# Patient Record
Sex: Male | Born: 1965 | Hispanic: No | Marital: Married | Smoking: Never smoker
Health system: Southern US, Community
[De-identification: ages and names within clinical notes are randomized; demographics above are authoritative.]

## PROBLEM LIST (undated history)

## (undated) DIAGNOSIS — G4730 Sleep apnea, unspecified: Secondary | ICD-10-CM

## (undated) DIAGNOSIS — I4892 Unspecified atrial flutter: Secondary | ICD-10-CM

## (undated) DIAGNOSIS — I10 Essential (primary) hypertension: Secondary | ICD-10-CM

## (undated) DIAGNOSIS — M1990 Unspecified osteoarthritis, unspecified site: Secondary | ICD-10-CM

## (undated) DIAGNOSIS — G709 Myoneural disorder, unspecified: Secondary | ICD-10-CM

## (undated) HISTORY — DX: Essential (primary) hypertension: I10

## (undated) HISTORY — PX: SPINE SURGERY: SHX786

## (undated) HISTORY — DX: Unspecified osteoarthritis, unspecified site: M19.90

## (undated) HISTORY — DX: Myoneural disorder, unspecified: G70.9

---

## 2013-03-22 DIAGNOSIS — Z87898 Personal history of other specified conditions: Secondary | ICD-10-CM | POA: Insufficient documentation

## 2013-05-24 ENCOUNTER — Other Ambulatory Visit (HOSPITAL_BASED_OUTPATIENT_CLINIC_OR_DEPARTMENT_OTHER): Payer: Self-pay | Admitting: Pain Medicine

## 2013-05-24 ENCOUNTER — Ambulatory Visit (HOSPITAL_BASED_OUTPATIENT_CLINIC_OR_DEPARTMENT_OTHER)
Admission: RE | Admit: 2013-05-24 | Discharge: 2013-05-24 | Disposition: A | Discharge status: Home / Self Care | Payer: 59 | Source: Ambulatory Visit | Attending: Pain Medicine | Admitting: Pain Medicine

## 2013-05-24 DIAGNOSIS — R52 Pain, unspecified: Secondary | ICD-10-CM

## 2013-05-24 DIAGNOSIS — M25519 Pain in unspecified shoulder: Secondary | ICD-10-CM | POA: Insufficient documentation

## 2013-05-24 DIAGNOSIS — M25569 Pain in unspecified knee: Secondary | ICD-10-CM | POA: Insufficient documentation

## 2013-05-27 DIAGNOSIS — G8918 Other acute postprocedural pain: Secondary | ICD-10-CM | POA: Insufficient documentation

## 2013-05-27 DIAGNOSIS — M549 Dorsalgia, unspecified: Secondary | ICD-10-CM | POA: Insufficient documentation

## 2013-05-27 DIAGNOSIS — I10 Essential (primary) hypertension: Secondary | ICD-10-CM | POA: Insufficient documentation

## 2013-06-08 ENCOUNTER — Other Ambulatory Visit (HOSPITAL_BASED_OUTPATIENT_CLINIC_OR_DEPARTMENT_OTHER): Payer: Self-pay | Admitting: Pain Medicine

## 2013-06-08 DIAGNOSIS — M25511 Pain in right shoulder: Secondary | ICD-10-CM

## 2013-06-08 DIAGNOSIS — M79605 Pain in left leg: Secondary | ICD-10-CM

## 2013-06-08 DIAGNOSIS — M542 Cervicalgia: Secondary | ICD-10-CM

## 2013-06-08 DIAGNOSIS — M5416 Radiculopathy, lumbar region: Secondary | ICD-10-CM

## 2013-06-08 DIAGNOSIS — M79604 Pain in right leg: Secondary | ICD-10-CM

## 2013-06-10 ENCOUNTER — Ambulatory Visit (HOSPITAL_BASED_OUTPATIENT_CLINIC_OR_DEPARTMENT_OTHER)
Admission: RE | Admit: 2013-06-10 | Discharge: 2013-06-10 | Disposition: A | Discharge status: Home / Self Care | Payer: 59 | Source: Ambulatory Visit | Attending: Pain Medicine | Admitting: Pain Medicine

## 2013-06-10 ENCOUNTER — Other Ambulatory Visit (HOSPITAL_BASED_OUTPATIENT_CLINIC_OR_DEPARTMENT_OTHER): Payer: 59

## 2013-06-10 DIAGNOSIS — R29890 Loss of height: Secondary | ICD-10-CM | POA: Insufficient documentation

## 2013-06-10 DIAGNOSIS — M25519 Pain in unspecified shoulder: Secondary | ICD-10-CM | POA: Insufficient documentation

## 2013-06-10 DIAGNOSIS — M25512 Pain in left shoulder: Secondary | ICD-10-CM

## 2013-06-10 DIAGNOSIS — M79604 Pain in right leg: Secondary | ICD-10-CM

## 2013-06-10 DIAGNOSIS — M1990 Unspecified osteoarthritis, unspecified site: Secondary | ICD-10-CM | POA: Insufficient documentation

## 2013-06-10 DIAGNOSIS — M25511 Pain in right shoulder: Secondary | ICD-10-CM

## 2013-06-10 DIAGNOSIS — M5416 Radiculopathy, lumbar region: Secondary | ICD-10-CM

## 2013-06-10 DIAGNOSIS — M542 Cervicalgia: Secondary | ICD-10-CM

## 2013-06-25 DIAGNOSIS — Z6841 Body Mass Index (BMI) 40.0 and over, adult: Secondary | ICD-10-CM | POA: Insufficient documentation

## 2013-10-01 DIAGNOSIS — G4700 Insomnia, unspecified: Secondary | ICD-10-CM | POA: Insufficient documentation

## 2013-10-22 ENCOUNTER — Other Ambulatory Visit: Payer: Self-pay | Admitting: Pain Medicine

## 2013-10-22 DIAGNOSIS — M5030 Other cervical disc degeneration, unspecified cervical region: Secondary | ICD-10-CM

## 2013-10-29 ENCOUNTER — Ambulatory Visit
Admission: RE | Admit: 2013-10-29 | Discharge: 2013-10-29 | Disposition: A | Discharge status: Home / Self Care | Payer: 59 | Source: Ambulatory Visit | Attending: Pain Medicine | Admitting: Pain Medicine

## 2013-10-29 DIAGNOSIS — M5030 Other cervical disc degeneration, unspecified cervical region: Secondary | ICD-10-CM

## 2014-01-06 DIAGNOSIS — G4733 Obstructive sleep apnea (adult) (pediatric): Secondary | ICD-10-CM | POA: Insufficient documentation

## 2015-08-08 ENCOUNTER — Encounter: Payer: Self-pay | Admitting: Family Medicine

## 2015-08-08 ENCOUNTER — Ambulatory Visit (INDEPENDENT_AMBULATORY_CARE_PROVIDER_SITE_OTHER): Payer: 59

## 2015-08-08 ENCOUNTER — Ambulatory Visit (INDEPENDENT_AMBULATORY_CARE_PROVIDER_SITE_OTHER): Payer: 59 | Admitting: Family Medicine

## 2015-08-08 VITALS — BP 132/88 | HR 80 | Temp 98.2°F | Resp 18

## 2015-08-08 DIAGNOSIS — R972 Elevated prostate specific antigen [PSA]: Secondary | ICD-10-CM

## 2015-08-08 DIAGNOSIS — S91302A Unspecified open wound, left foot, initial encounter: Secondary | ICD-10-CM | POA: Diagnosis not present

## 2015-08-08 DIAGNOSIS — G4730 Sleep apnea, unspecified: Secondary | ICD-10-CM | POA: Diagnosis not present

## 2015-08-08 DIAGNOSIS — Z6841 Body Mass Index (BMI) 40.0 and over, adult: Secondary | ICD-10-CM

## 2015-08-08 DIAGNOSIS — I10 Essential (primary) hypertension: Secondary | ICD-10-CM

## 2015-08-08 DIAGNOSIS — Z23 Encounter for immunization: Secondary | ICD-10-CM

## 2015-08-08 LAB — COMPREHENSIVE METABOLIC PANEL
ALK PHOS: 67 U/L (ref 40–115)
ALT: 20 U/L (ref 9–46)
AST: 21 U/L (ref 10–35)
Albumin: 4.1 g/dL (ref 3.6–5.1)
BUN: 8 mg/dL (ref 7–25)
CALCIUM: 11 mg/dL — AB (ref 8.6–10.3)
CO2: 30 mmol/L (ref 20–31)
Chloride: 102 mmol/L (ref 98–110)
Creat: 0.64 mg/dL — ABNORMAL LOW (ref 0.70–1.33)
GLUCOSE: 76 mg/dL (ref 65–99)
POTASSIUM: 4.9 mmol/L (ref 3.5–5.3)
Sodium: 138 mmol/L (ref 135–146)
TOTAL PROTEIN: 7.3 g/dL (ref 6.1–8.1)
Total Bilirubin: 0.5 mg/dL (ref 0.2–1.2)

## 2015-08-08 LAB — CBC WITH DIFFERENTIAL/PLATELET
BASOS ABS: 0 10*3/uL (ref 0.0–0.1)
Basophils Relative: 0 % (ref 0–1)
EOS PCT: 2 % (ref 0–5)
Eosinophils Absolute: 0.2 10*3/uL (ref 0.0–0.7)
HEMATOCRIT: 44.8 % (ref 39.0–52.0)
Hemoglobin: 14.4 g/dL (ref 13.0–17.0)
Lymphocytes Relative: 23 % (ref 12–46)
Lymphs Abs: 2.4 10*3/uL (ref 0.7–4.0)
MCH: 27.6 pg (ref 26.0–34.0)
MCHC: 32.1 g/dL (ref 30.0–36.0)
MCV: 85.8 fL (ref 78.0–100.0)
MONOS PCT: 14 % — AB (ref 3–12)
MPV: 9.8 fL (ref 8.6–12.4)
Monocytes Absolute: 1.5 10*3/uL — ABNORMAL HIGH (ref 0.1–1.0)
NEUTROS ABS: 6.3 10*3/uL (ref 1.7–7.7)
NEUTROS PCT: 61 % (ref 43–77)
Platelets: 229 10*3/uL (ref 150–400)
RBC: 5.22 MIL/uL (ref 4.22–5.81)
RDW: 14.1 % (ref 11.5–15.5)
WBC: 10.4 10*3/uL (ref 4.0–10.5)

## 2015-08-08 LAB — LIPID PANEL
CHOL/HDL RATIO: 4.1 ratio (ref <=5.0)
CHOLESTEROL: 176 mg/dL (ref 125–200)
HDL: 43 mg/dL (ref >=40)
LDL Cholesterol: 114 mg/dL (ref <130)
Triglycerides: 96 mg/dL (ref <150)
VLDL: 19 mg/dL (ref <30)

## 2015-08-08 LAB — HEMOGLOBIN A1C
Hgb A1c MFr Bld: 5.9 % — ABNORMAL HIGH (ref <5.7)
Mean Plasma Glucose: 123 mg/dL — ABNORMAL HIGH (ref <117)

## 2015-08-08 LAB — TSH: TSH: 2.921 u[IU]/mL (ref 0.350–4.500)

## 2015-08-08 MED ORDER — LOSARTAN POTASSIUM 100 MG PO TABS: 100.0000 mg | ORAL_TABLET | Freq: Every day | ORAL | Status: DC

## 2015-08-08 NOTE — Patient Instructions (Signed)
We will call you with your appointments for cat scan of your foot and wound center

## 2015-08-08 NOTE — Progress Notes (Signed)
Subjective:    Patient ID: HANNAH CRILL, male    DOB: 1966/05/08, 49 y.o.   MRN: [id number]  HPI This is a 49 yo male who presents today for follow up of HTN. He has not had any regular care in at least a year and a half. He recently found his old prescription for losartan and started it.   He has had a deep, open area on his left heel for about 7 months. He thinks it is getting better.   He is seen at the pain management center (Drs. Hardie Lora) for management of his back pain and right knee pain. He has a spinal cord stimulator and therefore can not have MRI imaging. Has not been seen by ortho for his knee.   Sees Dr. Jerre Simon for OSA and uses CPAP.   Past Medical History  Diagnosis Date  . Arthritis   . Hypertension    Past Surgical History  Procedure Laterality Date  . Spine surgery     Family History  Problem Relation Age of Onset  . Diabetes Mother   . Hyperlipidemia Mother   . Hypertension Mother   . Diabetes Sister   . Hypertension Sister    Social History  Substance Use Topics  . Smoking status: Not on file  . Smokeless tobacco: Not on file  . Alcohol Use: Not on file   Review of Systems  Constitutional: Negative for fever and chills.  Respiratory: Negative for cough, chest tightness and shortness of breath.   Cardiovascular: Positive for leg swelling. Negative for chest pain and palpitations.  Gastrointestinal: Negative for nausea, abdominal pain, diarrhea and constipation.  Endocrine: Positive for polydipsia. Negative for polyphagia and polyuria.  Musculoskeletal: Positive for back pain, joint swelling (right knee) and arthralgias.      Objective:   Physical Exam  Constitutional: He is oriented to person, place, and time. He appears well-developed and well-nourished. No distress.  Obese.  HENT:  Head: Normocephalic and atraumatic.  Right Ear: External ear normal.  Left Ear: External ear normal.  Nose: Nose normal.  Mouth/Throat: Oropharynx is  clear and moist.  Eyes: Conjunctivae are normal.  Neck: Normal range of motion. Neck supple.  Cardiovascular: Normal rate, regular rhythm and normal heart sounds.   Pulmonary/Chest: Effort normal and breath sounds normal.  Musculoskeletal: Edema: 1+ pretibial.       Feet:  Left knee with brace. Some crepitus with flexion/extension.   Neurological: He is alert and oriented to person, place, and time.  Skin: Skin is warm and dry. He is not diaphoretic.  Psychiatric: He has a normal mood and affect. His behavior is normal. Judgment and thought content normal.  Vitals reviewed.  BP 132/88 mmHg  Pulse 80  Temp(Src) 98.2 F (36.8 C)  Resp 18  [facility] reading (PRIMARY) by  Dr. Cleta Alberts- no acute abnormality, large achilles bone spur noted. .     Assessment & Plan:  Discussed with Dr. Cleta Alberts who also examined patient 1. Wound, open, foot with complication, left, initial encounter - this is possibly due to altered gait with chronic left knee pain.  - CBC with Differential/Platelet - Wound culture - Hemoglobin A1c - DG Foot Complete Left; Future - AMB referral to wound care center - CT Foot Left Wo Contrast; Future  2. Essential hypertension - Comprehensive metabolic panel - losartan (COZAAR) 100 MG tablet; Take 1 tablet (100 mg total) by mouth daily.  Dispense: 90 tablet; Refill: 1  3. Body mass index (BMI) of  50-59.9 in adult [facility](HCC) - Lipid panel - TSH - Hemoglobin A1c - encouraged him to eliminate soda and juice from his diet and decrease starches and sugars  4. Apnea, sleep - continue CPAP  5. Elevated PSA - PSA  6. Needs flu shot - Flu Vaccine QUAD 36+ mos IM  - 2 month follow up for CPE  Olean Reeeborah Jamichael Knotts, FNP-BC  [facility]  08/12/2015 6:10 PM

## 2015-08-09 LAB — PSA: PSA: 3.19 ng/mL (ref <=4.00)

## 2015-08-12 LAB — WOUND CULTURE
GRAM STAIN: NONE SEEN
GRAM STAIN: NONE SEEN

## 2015-08-13 ENCOUNTER — Other Ambulatory Visit: Payer: Self-pay | Admitting: Emergency Medicine

## 2015-08-13 MED ORDER — CEPHALEXIN 500 MG PO CAPS: 500.0000 mg | ORAL_CAPSULE | Freq: Three times a day (TID) | ORAL | Status: DC

## 2015-08-15 ENCOUNTER — Ambulatory Visit (HOSPITAL_BASED_OUTPATIENT_CLINIC_OR_DEPARTMENT_OTHER)
Admission: RE | Admit: 2015-08-15 | Discharge: 2015-08-15 | Disposition: A | Discharge status: Home / Self Care | Payer: 59 | Source: Ambulatory Visit | Attending: Family Medicine | Admitting: Family Medicine

## 2015-08-15 ENCOUNTER — Other Ambulatory Visit: Payer: Self-pay | Admitting: Family Medicine

## 2015-08-15 DIAGNOSIS — X58XXXA Exposure to other specified factors, initial encounter: Secondary | ICD-10-CM | POA: Diagnosis not present

## 2015-08-15 DIAGNOSIS — S91302A Unspecified open wound, left foot, initial encounter: Secondary | ICD-10-CM | POA: Diagnosis not present

## 2015-08-15 DIAGNOSIS — R112 Nausea with vomiting, unspecified: Secondary | ICD-10-CM

## 2015-08-15 MED ORDER — ONDANSETRON 8 MG PO TBDP: 8.0000 mg | ORAL_TABLET | Freq: Three times a day (TID) | ORAL | Status: DC | PRN

## 2015-08-16 ENCOUNTER — Telehealth: Payer: Self-pay

## 2015-08-16 NOTE — Telephone Encounter (Signed)
Left message for the patient call back about his referral to [facility].  The patient is scheduled for an appointment on 09/13/15 at 2:30pm with a 2:15pm arrival time.  If he has any questions or needs to change his appointment, he can call [phone] and press 1 for the scheduling department.  Their address is [address] [place], [place] [address].

## 2015-08-25 ENCOUNTER — Telehealth: Payer: Self-pay

## 2015-08-25 NOTE — Telephone Encounter (Signed)
Patients wife called inquiring about the referral.  I relayed the information regarding the appointment and provided her with the phone number.

## 2015-09-13 ENCOUNTER — Encounter (HOSPITAL_BASED_OUTPATIENT_CLINIC_OR_DEPARTMENT_OTHER): Payer: 59

## 2015-09-19 DIAGNOSIS — M25569 Pain in unspecified knee: Secondary | ICD-10-CM | POA: Diagnosis not present

## 2015-09-19 DIAGNOSIS — G894 Chronic pain syndrome: Secondary | ICD-10-CM | POA: Diagnosis not present

## 2015-09-19 DIAGNOSIS — M1288 Other specific arthropathies, not elsewhere classified, other specified site: Secondary | ICD-10-CM | POA: Diagnosis not present

## 2015-09-19 DIAGNOSIS — M47817 Spondylosis without myelopathy or radiculopathy, lumbosacral region: Secondary | ICD-10-CM | POA: Diagnosis not present

## 2015-09-19 MED FILL — OPANA ER 30 MG TABLET: 30 | 30 days supply | Qty: 60 | Fill #0

## 2015-09-19 MED FILL — NUCYNTA 100 MG TABLET: 100 | 30 days supply | Qty: 120 | Fill #0

## 2015-09-20 MED FILL — tiZANidine HCL 4 MG TABS: 4 | 30 days supply | Qty: 90 | Fill #3

## 2015-09-23 DIAGNOSIS — R0902 Hypoxemia: Secondary | ICD-10-CM | POA: Diagnosis not present

## 2015-09-23 DIAGNOSIS — G4733 Obstructive sleep apnea (adult) (pediatric): Secondary | ICD-10-CM | POA: Diagnosis not present

## 2015-10-10 ENCOUNTER — Encounter: Payer: Self-pay | Admitting: Family Medicine

## 2015-10-10 ENCOUNTER — Ambulatory Visit (INDEPENDENT_AMBULATORY_CARE_PROVIDER_SITE_OTHER): Payer: 59 | Admitting: Family Medicine

## 2015-10-10 VITALS — BP 140/92 | HR 67 | Temp 97.2°F | Resp 18 | Ht 71.0 in | Wt 382.0 lb

## 2015-10-10 DIAGNOSIS — Z0000 Encounter for general adult medical examination without abnormal findings: Secondary | ICD-10-CM

## 2015-10-10 DIAGNOSIS — Z6841 Body Mass Index (BMI) 40.0 and over, adult: Secondary | ICD-10-CM

## 2015-10-10 DIAGNOSIS — S91302A Unspecified open wound, left foot, initial encounter: Secondary | ICD-10-CM | POA: Diagnosis not present

## 2015-10-10 DIAGNOSIS — M25561 Pain in right knee: Secondary | ICD-10-CM | POA: Diagnosis not present

## 2015-10-10 DIAGNOSIS — I10 Essential (primary) hypertension: Secondary | ICD-10-CM | POA: Diagnosis not present

## 2015-10-10 DIAGNOSIS — R51 Headache: Secondary | ICD-10-CM | POA: Diagnosis not present

## 2015-10-10 DIAGNOSIS — G4730 Sleep apnea, unspecified: Secondary | ICD-10-CM

## 2015-10-10 DIAGNOSIS — R519 Headache, unspecified: Secondary | ICD-10-CM

## 2015-10-10 MED ORDER — LOSARTAN POTASSIUM-HCTZ 100-25 MG PO TABS: 1.0000 | ORAL_TABLET | Freq: Every day | ORAL | Status: DC

## 2015-10-10 MED FILL — LOSARTAN-HCTZ 100-25 MG TAB: 100-25 | 90 days supply | Qty: 90 | Fill #0

## 2015-10-10 NOTE — Progress Notes (Signed)
Subjective:    Patient ID: Charles Barnett, male    DOB: 1966/06/28, 50 y.o.   MRN: [id number]  HPI This is a pleasant 50 yo male who presents today for CPE. He is accompanied by his wife. He has multiple chronic problems and is disabled due to chronic back pain. He is seen by pain management regularly for his prescriptions and injections. He has an implanted nerve stimulator in his back which helps with pain.   Last CPE- many years PSA- 11/16 Colonoscopy- never, will think about Tdap- 2014 Flu- 11/16 Dental- 2016 Eye- regular Exercise- intermittent at best  He has had more headaches lately, had previous head trauma. Was treated with some type of injection therapy in the 90s which worked for [employer]. Has had headaches every 2 weeks lasting about a day. No photophobia, no visual changes, no nausea/vomiting.   Continues to have right knee pain and popping. Wears his brace.   24 hour diet recall- breakfast- fried potatoes with eggs, peppers and onions, Lunch- doesn't eat often, Dinner- meat, potatoes/rice, beans. Drinks 4 servings of soda a day, sweet tea, occasional juice.   Has OSA and uses cpap nightly with good results.   He was seen 08/08/15 with open wound on left heel. Wound was cultured and he was given keflex. Plain film and CT were negative for osteomyelitis. He was referred to the wound clinic. He had an appointment about a month later. He was unable to keep the appointment due to bad headache that day. He has been putting Neosporin on wound and filling hard skin with decreasing size of wound. He is able to wear shoes again and bear weight. No drainage.   Past Medical History  Diagnosis Date  . Arthritis   . Hypertension   . Neuromuscular disorder [facility])    Past Surgical History  Procedure Laterality Date  . Spine surgery     Family History  Problem Relation Age of Onset  . Diabetes Mother   . Hyperlipidemia Mother   . Hypertension Mother   . Heart disease Mother     . Diabetes Sister   . Hypertension Sister   . Heart disease Sister    Social History  Substance Use Topics  . Smoking status: Never Smoker   . Smokeless tobacco: None  . Alcohol Use: No    Review of Systems  Constitutional: Negative.   HENT: Positive for congestion.   Eyes: Negative.   Respiratory: Negative.   Cardiovascular: Positive for leg swelling.  Gastrointestinal: Positive for constipation (good results with miralax).  Endocrine: Negative.   Genitourinary: Negative.   Musculoskeletal: Positive for back pain, joint swelling, arthralgias and gait problem.  Skin: Negative.   Allergic/Immunologic: Positive for environmental allergies.  Neurological: Positive for headaches.  Hematological: Negative.   Psychiatric/Behavioral: Negative.        Objective:   Physical Exam  Constitutional: He is oriented to person, place, and time. He appears well-developed and well-nourished.  Morbidly obese  HENT:  Head: Normocephalic and atraumatic.  Right Ear: External ear normal.  Left Ear: External ear normal.  Nose: Nose normal.  Mouth/Throat: Oropharynx is clear and moist.  Eyes: Conjunctivae are normal. Pupils are equal, round, and reactive to light.  Neck: Normal range of motion. Neck supple. No thyromegaly present.  Cardiovascular: Normal rate, regular rhythm, normal heart sounds and intact distal pulses.   Pulmonary/Chest: Effort normal and breath sounds normal.  Abdominal: Soft. Bowel sounds are normal. There is no tenderness. There is  no rebound and no guarding. A hernia (reducible umbilical hernia (chronic)) is present.  Genitourinary: Penis normal.  Musculoskeletal: He exhibits edema (trace pretibial) and tenderness (generalized tenderness along lower back).  Generalized stiffness/decreased ROM back, hips, knees.   Lymphadenopathy:    He has no cervical adenopathy.  Neurological: He is alert and oriented to person, place, and time. He has normal reflexes.  Skin: Skin  is warm and dry.  Left heel wound much smaller, approximately 1.5 cm long and 4 mm deep with some surrounding callous. No erythema or drainage.   Psychiatric: He has a normal mood and affect. His behavior is normal. Judgment and thought content normal.  Vitals reviewed.  BP 138/100 mmHg  Pulse 67  Temp(Src) 97.2 F (36.2 C) (Oral)  Resp 18  Ht '5\' 11"'$  (1.803 m)  Wt 382 lb (173.274 kg)  BMI 53.30 kg/m2  SpO2 95% Wt Readings from Last 3 Encounters:  10/10/15 382 lb (173.274 kg)  Repeat BP 140/92 Labs from last visit 08/08/15 Recent Results (from the past 2160 hour(s))  CBC with Differential/Platelet     Status: Abnormal   Collection Time: 08/08/15  1:48 PM  Result Value Ref Range   WBC 10.4 4.0 - 10.5 K/uL   RBC 5.22 4.22 - 5.81 MIL/uL   Hemoglobin 14.4 13.0 - 17.0 g/dL   HCT 44.8 39.0 - 52.0 %   MCV 85.8 78.0 - 100.0 fL   MCH 27.6 26.0 - 34.0 pg   MCHC 32.1 30.0 - 36.0 g/dL   RDW 14.1 11.5 - 15.5 %   Platelets 229 150 - 400 K/uL   MPV 9.8 8.6 - 12.4 fL   Neutrophils Relative % 61 43 - 77 %   Neutro Abs 6.3 1.7 - 7.7 K/uL   Lymphocytes Relative 23 12 - 46 %   Lymphs Abs 2.4 0.7 - 4.0 K/uL   Monocytes Relative 14 (H) 3 - 12 %   Monocytes Absolute 1.5 (H) 0.1 - 1.0 K/uL   Eosinophils Relative 2 0 - 5 %   Eosinophils Absolute 0.2 0.0 - 0.7 K/uL   Basophils Relative 0 0 - 1 %   Basophils Absolute 0.0 0.0 - 0.1 K/uL   Smear Review Criteria for review not met   Comprehensive metabolic panel     Status: Abnormal   Collection Time: 08/08/15  1:48 PM  Result Value Ref Range   Sodium 138 135 - 146 mmol/L   Potassium 4.9 3.5 - 5.3 mmol/L   Chloride 102 98 - 110 mmol/L   CO2 30 20 - 31 mmol/L   Glucose, Bld 76 65 - 99 mg/dL   BUN 8 7 - 25 mg/dL   Creat 0.64 (L) 0.70 - 1.33 mg/dL   Total Bilirubin 0.5 0.2 - 1.2 mg/dL   Alkaline Phosphatase 67 40 - 115 U/L   AST 21 10 - 35 U/L   ALT 20 9 - 46 U/L   Total Protein 7.3 6.1 - 8.1 g/dL   Albumin 4.1 3.6 - 5.1 g/dL   Calcium 11.0  (H) 8.6 - 10.3 mg/dL  Lipid panel     Status: None   Collection Time: 08/08/15  1:48 PM  Result Value Ref Range   Cholesterol 176 125 - 200 mg/dL   Triglycerides 96 <150 mg/dL   HDL 43 >=40 mg/dL   Total CHOL/HDL Ratio 4.1 <=5.0 Ratio   VLDL 19 <30 mg/dL   LDL Cholesterol 114 <130 mg/dL       TSH  Status: None   Collection Time: 08/08/15  1:48 PM  Result Value Ref Range   TSH 2.921 0.350 - 4.500 uIU/mL  PSA     Status: None   Collection Time: 08/08/15  1:48 PM  Result Value Ref Range   PSA 3.19 <=4.00 ng/mL       Hemoglobin A1c     Status: Abnormal   Collection Time: 08/08/15  1:48 PM  Result Value Ref Range   Hgb A1c MFr Bld 5.9 (H) <5.7 %     Assessment & Plan:  1. Annual physical exam -- Discussed and encouraged healthy lifestyle choices- adequate sleep, regular exercise, stress management and healthy food choices.  2. Essential hypertension - blood pressure high in office today, will add diuretic - losartan-hydrochlorothiazide (HYZAAR) 100-25 MG tablet; Take 1 tablet by mouth daily.  Dispense: 90 tablet; Refill: 1  3. Body mass index (BMI) of 50-59.9 in adult [facility]) - discussed barriers to healthy food choices and encouraged regular exercise - the patient and his wife are looking into  weight loss education  4. Apnea, sleep - continue nightly CPAP use  5. Wound, open, foot with complication, left, initial encounter - patient was never seen wound center. Wound significantly decreased in size following antibiotic therapy and pain resolved. Continue to keep clean and dry, open to air when able. Follow up if pain, increased size or drainage  6. Right knee pain - encouraged weight loss - Ambulatory referral to Orthopedic Surgery  7. Nonintractable episodic headache, unspecified headache type - has been a little better lately, discussed treatment options with tylenol, hydration - encouraged him to keep a headache log, if no improvement he will let me know if  he needs headache clinic referral  - follow up 3 months   Clarene Reamer, FNP-BC  [facility]  10/12/2015 9:52 PM

## 2015-10-10 NOTE — Patient Instructions (Addendum)
Please keep a headache log- date, time, food in last 12 hours, how long it lasts and what you take.   Please do some type of exercise every day  Please stop losartan and start losartan-HCTZ (hyzaar)  For your left foot wound. Stop Neosporin    Why follow it? Research shows. . Those who follow the Mediterranean diet have a reduced risk of heart disease  . The diet is associated with a reduced incidence of Parkinson's and Alzheimer's diseases . People following the diet may have longer life expectancies and lower rates of chronic diseases  . The Dietary Guidelines for Americans recommends the Mediterranean diet as an eating plan to promote health and prevent disease  What Is the Mediterranean Diet?  . Healthy eating plan based on typical foods and recipes of Mediterranean-style cooking . The diet is primarily a plant based diet; these foods should make up a majority of meals   Starches - Plant based foods should make up a majority of meals - They are an important sources of vitamins, minerals, energy, antioxidants, and fiber - Choose whole grains, foods high in fiber and minimally processed items  - Typical grain sources include wheat, oats, barley, corn, brown rice, bulgar, farro, millet, polenta, couscous  - Various types of beans include chickpeas, lentils, fava beans, black beans, white beans   Fruits  Veggies - Large quantities of antioxidant rich fruits & veggies; 6 or more servings  - Vegetables can be eaten raw or lightly drizzled with oil and cooked  - Vegetables common to the traditional Mediterranean Diet include: artichokes, arugula, beets, broccoli, brussel sprouts, cabbage, carrots, celery, collard greens, cucumbers, eggplant, kale, leeks, lemons, lettuce, mushrooms, okra, onions, peas, peppers, potatoes, pumpkin, radishes, rutabaga, shallots, spinach, sweet potatoes, turnips, zucchini - Fruits common to the Mediterranean Diet include: apples, apricots, avocados, cherries,  clementines, dates, figs, grapefruits, grapes, melons, nectarines, oranges, peaches, pears, pomegranates, strawberries, tangerines  Fats - Replace butter and margarine with healthy oils, such as olive oil, canola oil, and tahini  - Limit nuts to no more than a handful a day  - Nuts include walnuts, almonds, pecans, pistachios, pine nuts  - Limit or avoid candied, honey roasted or heavily salted nuts - Olives are central to the [employer] - can be eaten whole or used in a variety of dishes   Meats Protein - Limiting red meat: no more than a few times a month - When eating red meat: choose lean cuts and keep the portion to the size of deck of cards - Eggs: approx. 0 to 4 times a week  - Fish and lean poultry: at least 2 a week  - Healthy protein sources include, chicken, [place], lean beef, lamb - Increase intake of seafood such as tuna, salmon, trout, mackerel, shrimp, scallops - Avoid or limit high fat processed meats such as sausage and bacon  Dairy - Include moderate amounts of low fat dairy products  - Focus on healthy dairy such as fat free yogurt, skim milk, low or reduced fat cheese - Limit dairy products higher in fat such as whole or 2% milk, cheese, ice cream  Alcohol - Moderate amounts of red wine is ok  - No more than 5 oz daily for women (all ages) and men older than age 73  - No more than 10 oz of wine daily for men younger than 5  Other - Limit sweets and other desserts  - Use herbs and spices instead of salt to flavor  foods  - Herbs and spices common to the traditional Mediterranean Diet include: basil, bay leaves, chives, cloves, cumin, fennel, garlic, lavender, marjoram, mint, oregano, parsley, pepper, rosemary, sage, savory, sumac, tarragon, thyme   It's not just a diet, it's a lifestyle:  . The Mediterranean diet includes lifestyle factors typical of those in the region  . Foods, drinks and meals are best eaten with others and savored . Daily physical activity is  important for overall good health . This could be strenuous exercise like running and aerobics . This could also be more leisurely activities such as walking, housework, yard-work, or taking the stairs . Moderation is the key; a balanced and healthy diet accommodates most foods and drinks . Consider portion sizes and frequency of consumption of certain foods   Meal Ideas & Options:  . Breakfast:  o Whole wheat toast or whole wheat English muffins with peanut butter & hard boiled egg o Steel cut oats topped with apples & cinnamon and skim milk  o Fresh fruit: banana, strawberries, melon, berries, peaches  o Smoothies: strawberries, bananas, greek yogurt, peanut butter o Low fat greek yogurt with blueberries and granola  o Egg white omelet with spinach and mushrooms o Breakfast couscous: whole wheat couscous, apricots, skim milk, cranberries  . Sandwiches:  o Hummus and grilled vegetables (peppers, zucchini, squash) on whole wheat bread   o Grilled chicken on whole wheat pita with lettuce, tomatoes, cucumbers or tzatziki  o Tuna salad on whole wheat bread: tuna salad made with greek yogurt, olives, red peppers, capers, green onions o Garlic rosemary lamb pita: lamb sauted with garlic, rosemary, salt & pepper; add lettuce, cucumber, greek yogurt to pita - flavor with lemon juice and black pepper  . Seafood:  o Mediterranean grilled salmon, seasoned with garlic, basil, parsley, lemon juice and black pepper o Shrimp, lemon, and spinach whole-grain pasta salad made with low fat greek yogurt  o Seared scallops with lemon orzo  o Seared tuna steaks seasoned salt, pepper, coriander topped with tomato mixture of olives, tomatoes, olive oil, minced garlic, parsley, green onions and cappers  . Meats:  o Herbed greek chicken salad with kalamata olives, cucumber, feta  o Red bell peppers stuffed with spinach, bulgur, lean ground beef (or lentils) & topped with feta   o Kebabs: skewers of chicken,  tomatoes, onions, zucchini, squash  o [place] burgers: made with red onions, mint, dill, lemon juice, feta cheese topped with roasted red peppers . Vegetarian o Cucumber salad: cucumbers, artichoke hearts, celery, red onion, feta cheese, tossed in olive oil & lemon juice  o Hummus and whole grain pita points with a greek salad (lettuce, tomato, feta, olives, cucumbers, red onion) o Lentil soup with celery, carrots made with vegetable broth, garlic, salt and pepper  o Tabouli salad: parsley, bulgur, mint, scallions, cucumbers, tomato, radishes, lemon juice, olive oil, salt and pepper.

## 2015-10-17 DIAGNOSIS — M1288 Other specific arthropathies, not elsewhere classified, other specified site: Secondary | ICD-10-CM | POA: Diagnosis not present

## 2015-10-17 DIAGNOSIS — M25569 Pain in unspecified knee: Secondary | ICD-10-CM | POA: Diagnosis not present

## 2015-10-17 DIAGNOSIS — M47817 Spondylosis without myelopathy or radiculopathy, lumbosacral region: Secondary | ICD-10-CM | POA: Diagnosis not present

## 2015-10-17 DIAGNOSIS — G894 Chronic pain syndrome: Secondary | ICD-10-CM | POA: Diagnosis not present

## 2015-10-17 MED FILL — OPANA ER 30 MG TABLET: 30 | 30 days supply | Qty: 60 | Fill #0

## 2015-10-17 MED FILL — NUCYNTA 100 MG TABLET: 100 | 30 days supply | Qty: 120 | Fill #0

## 2015-10-18 DIAGNOSIS — M1711 Unilateral primary osteoarthritis, right knee: Secondary | ICD-10-CM | POA: Diagnosis not present

## 2015-10-24 DIAGNOSIS — R0902 Hypoxemia: Secondary | ICD-10-CM | POA: Diagnosis not present

## 2015-10-24 DIAGNOSIS — G4733 Obstructive sleep apnea (adult) (pediatric): Secondary | ICD-10-CM | POA: Diagnosis not present

## 2015-11-15 DIAGNOSIS — M47817 Spondylosis without myelopathy or radiculopathy, lumbosacral region: Secondary | ICD-10-CM | POA: Diagnosis not present

## 2015-11-15 DIAGNOSIS — M25569 Pain in unspecified knee: Secondary | ICD-10-CM | POA: Diagnosis not present

## 2015-11-15 DIAGNOSIS — G894 Chronic pain syndrome: Secondary | ICD-10-CM | POA: Diagnosis not present

## 2015-11-15 DIAGNOSIS — M1288 Other specific arthropathies, not elsewhere classified, other specified site: Secondary | ICD-10-CM | POA: Diagnosis not present

## 2015-11-15 MED FILL — OPANA ER 30 MG TABLET: 30 | 30 days supply | Qty: 60 | Fill #0

## 2015-11-15 MED FILL — tiZANidine HCL 4 MG TABS: 4 | 30 days supply | Qty: 90 | Fill #4

## 2015-11-15 MED FILL — NUCYNTA 100 MG TABLET: 100 | 30 days supply | Qty: 120 | Fill #0

## 2015-11-17 MED FILL — DICLOFENAC SOD EC 75 MG TAB: 75 | 30 days supply | Qty: 60 | Fill #3

## 2015-11-21 DIAGNOSIS — G4733 Obstructive sleep apnea (adult) (pediatric): Secondary | ICD-10-CM | POA: Diagnosis not present

## 2015-11-21 DIAGNOSIS — R0902 Hypoxemia: Secondary | ICD-10-CM | POA: Diagnosis not present

## 2015-12-20 DIAGNOSIS — G894 Chronic pain syndrome: Secondary | ICD-10-CM | POA: Diagnosis not present

## 2015-12-20 DIAGNOSIS — M25569 Pain in unspecified knee: Secondary | ICD-10-CM | POA: Diagnosis not present

## 2015-12-20 DIAGNOSIS — M47817 Spondylosis without myelopathy or radiculopathy, lumbosacral region: Secondary | ICD-10-CM | POA: Diagnosis not present

## 2015-12-20 DIAGNOSIS — M1288 Other specific arthropathies, not elsewhere classified, other specified site: Secondary | ICD-10-CM | POA: Diagnosis not present

## 2015-12-20 MED FILL — OPANA ER 30 MG TABLET: 30 | 30 days supply | Qty: 60 | Fill #0

## 2015-12-20 MED FILL — DICLOFENAC SOD EC 75 MG TAB: 75 | 30 days supply | Qty: 60 | Fill #4

## 2015-12-20 MED FILL — NUCYNTA 100 MG TABLET: 100 | 30 days supply | Qty: 120 | Fill #0

## 2015-12-20 MED FILL — tiZANidine HCL 4 MG TABS: 4 | 30 days supply | Qty: 90 | Fill #5

## 2015-12-22 DIAGNOSIS — G4733 Obstructive sleep apnea (adult) (pediatric): Secondary | ICD-10-CM | POA: Diagnosis not present

## 2015-12-22 DIAGNOSIS — R0902 Hypoxemia: Secondary | ICD-10-CM | POA: Diagnosis not present

## 2015-12-28 DIAGNOSIS — G4733 Obstructive sleep apnea (adult) (pediatric): Secondary | ICD-10-CM | POA: Diagnosis not present

## 2016-01-02 MED FILL — LOSARTAN-HCTZ 100-25 MG TAB: 100-25 | 90 days supply | Qty: 90 | Fill #1

## 2016-01-17 DIAGNOSIS — M25569 Pain in unspecified knee: Secondary | ICD-10-CM | POA: Diagnosis not present

## 2016-01-17 DIAGNOSIS — G894 Chronic pain syndrome: Secondary | ICD-10-CM | POA: Diagnosis not present

## 2016-01-17 DIAGNOSIS — M1288 Other specific arthropathies, not elsewhere classified, other specified site: Secondary | ICD-10-CM | POA: Diagnosis not present

## 2016-01-17 DIAGNOSIS — M47817 Spondylosis without myelopathy or radiculopathy, lumbosacral region: Secondary | ICD-10-CM | POA: Diagnosis not present

## 2016-01-17 MED FILL — OPANA ER 30 MG TABLET: 30 | 30 days supply | Qty: 60 | Fill #0

## 2016-01-17 MED FILL — NUCYNTA 100 MG TABLET: 100 | 30 days supply | Qty: 120 | Fill #0

## 2016-01-21 DIAGNOSIS — R0902 Hypoxemia: Secondary | ICD-10-CM | POA: Diagnosis not present

## 2016-01-21 DIAGNOSIS — G4733 Obstructive sleep apnea (adult) (pediatric): Secondary | ICD-10-CM | POA: Diagnosis not present

## 2016-02-14 DIAGNOSIS — G894 Chronic pain syndrome: Secondary | ICD-10-CM | POA: Diagnosis not present

## 2016-02-14 DIAGNOSIS — M1288 Other specific arthropathies, not elsewhere classified, other specified site: Secondary | ICD-10-CM | POA: Diagnosis not present

## 2016-02-14 DIAGNOSIS — M47817 Spondylosis without myelopathy or radiculopathy, lumbosacral region: Secondary | ICD-10-CM | POA: Diagnosis not present

## 2016-02-14 DIAGNOSIS — M25569 Pain in unspecified knee: Secondary | ICD-10-CM | POA: Diagnosis not present

## 2016-02-14 MED FILL — NUCYNTA 100 MG TABLET: 100 | 30 days supply | Qty: 120 | Fill #0

## 2016-02-14 MED FILL — OPANA ER 30 MG TABLET: 30 | 30 days supply | Qty: 60 | Fill #0

## 2016-02-14 MED FILL — tiZANidine HCL 4 MG TABS: 4 | 30 days supply | Qty: 90 | Fill #0

## 2016-02-21 DIAGNOSIS — R0902 Hypoxemia: Secondary | ICD-10-CM | POA: Diagnosis not present

## 2016-02-21 DIAGNOSIS — G4733 Obstructive sleep apnea (adult) (pediatric): Secondary | ICD-10-CM | POA: Diagnosis not present

## 2016-03-13 DIAGNOSIS — M25569 Pain in unspecified knee: Secondary | ICD-10-CM | POA: Diagnosis not present

## 2016-03-13 DIAGNOSIS — M1288 Other specific arthropathies, not elsewhere classified, other specified site: Secondary | ICD-10-CM | POA: Diagnosis not present

## 2016-03-13 DIAGNOSIS — M47817 Spondylosis without myelopathy or radiculopathy, lumbosacral region: Secondary | ICD-10-CM | POA: Diagnosis not present

## 2016-03-13 DIAGNOSIS — G894 Chronic pain syndrome: Secondary | ICD-10-CM | POA: Diagnosis not present

## 2016-03-13 DIAGNOSIS — Z79891 Long term (current) use of opiate analgesic: Secondary | ICD-10-CM | POA: Diagnosis not present

## 2016-03-13 DIAGNOSIS — Z79899 Other long term (current) drug therapy: Secondary | ICD-10-CM | POA: Diagnosis not present

## 2016-03-13 MED FILL — OPANA ER 30 MG TABLET: 30 | 30 days supply | Qty: 60 | Fill #0

## 2016-03-13 MED FILL — NUCYNTA 100 MG TABLET: 100 | 30 days supply | Qty: 120 | Fill #0

## 2016-03-14 MED FILL — tiZANidine HCL 4 MG TABS: 4 | 30 days supply | Qty: 90 | Fill #1

## 2016-03-22 DIAGNOSIS — G4733 Obstructive sleep apnea (adult) (pediatric): Secondary | ICD-10-CM | POA: Diagnosis not present

## 2016-03-22 DIAGNOSIS — R0902 Hypoxemia: Secondary | ICD-10-CM | POA: Diagnosis not present

## 2016-04-10 DIAGNOSIS — G894 Chronic pain syndrome: Secondary | ICD-10-CM | POA: Diagnosis not present

## 2016-04-10 DIAGNOSIS — M1288 Other specific arthropathies, not elsewhere classified, other specified site: Secondary | ICD-10-CM | POA: Diagnosis not present

## 2016-04-10 DIAGNOSIS — M25569 Pain in unspecified knee: Secondary | ICD-10-CM | POA: Diagnosis not present

## 2016-04-10 DIAGNOSIS — M47817 Spondylosis without myelopathy or radiculopathy, lumbosacral region: Secondary | ICD-10-CM | POA: Diagnosis not present

## 2016-04-10 MED FILL — OPANA ER 30 MG TABLET: 30 | 30 days supply | Qty: 60 | Fill #0

## 2016-04-10 MED FILL — NUCYNTA 100 MG TABLET: 100 | 30 days supply | Qty: 120 | Fill #0

## 2016-04-17 DIAGNOSIS — G4733 Obstructive sleep apnea (adult) (pediatric): Secondary | ICD-10-CM | POA: Diagnosis not present

## 2016-04-22 DIAGNOSIS — G4733 Obstructive sleep apnea (adult) (pediatric): Secondary | ICD-10-CM | POA: Diagnosis not present

## 2016-04-22 DIAGNOSIS — R0902 Hypoxemia: Secondary | ICD-10-CM | POA: Diagnosis not present

## 2016-05-14 ENCOUNTER — Ambulatory Visit (INDEPENDENT_AMBULATORY_CARE_PROVIDER_SITE_OTHER): Payer: 59 | Admitting: Physician Assistant

## 2016-05-14 VITALS — BP 130/80 | HR 83 | Temp 98.3°F | Resp 17 | Ht 72.0 in | Wt >= 6400 oz

## 2016-05-14 DIAGNOSIS — M25561 Pain in right knee: Secondary | ICD-10-CM

## 2016-05-14 DIAGNOSIS — I10 Essential (primary) hypertension: Secondary | ICD-10-CM | POA: Diagnosis not present

## 2016-05-14 MED ORDER — LOSARTAN POTASSIUM-HCTZ 100-25 MG PO TABS: 1.0000 | ORAL_TABLET | Freq: Every day | ORAL | 1 refills | Status: DC

## 2016-05-14 MED FILL — LOSARTAN-HCTZ 100-25 MG TAB: 100-25 | 90 days supply | Qty: 90 | Fill #0

## 2016-05-14 NOTE — Patient Instructions (Addendum)
   IF you received an x-ray today, you will receive an invoice from [facility]. Please contact [facility] at [phone] with questions or concerns regarding your invoice.   IF you received labwork today, you will receive an invoice from [facility]. Please contact [facility] at [phone] with questions or concerns regarding your invoice.   Our billing staff will not be able to assist you with questions regarding bills from these companies.  You will be contacted with the lab results as soon as they are available. The fastest way to get your results is to activate your My Chart account. Instructions are located on the last page of this paperwork. If you have not heard from us regarding the results in 2 weeks, please contact this office.     Exercising to Lose Weight Exercising can help you to lose weight. In order to lose weight through exercise, you need to do vigorous-intensity exercise. You can tell that you are exercising with vigorous intensity if you are breathing very hard and fast and cannot hold a conversation while exercising. Moderate-intensity exercise helps to maintain your current weight. You can tell that you are exercising at a moderate level if you have a higher heart rate and faster breathing, but you are still able to hold a conversation. HOW OFTEN SHOULD I EXERCISE? Choose an activity that you enjoy and set realistic goals. Your health care provider can help you to make an activity plan that works for you. Exercise regularly as directed by your health care provider. This may include:  Doing resistance training twice each week, such as:  Push-ups.  Sit-ups.  Lifting weights.  Using resistance bands.  Doing a given intensity of exercise for a given amount of time. Choose from these options:  150 minutes of moderate-intensity exercise every week.  75 minutes of vigorous-intensity exercise every week.  A mix of  moderate-intensity and vigorous-intensity exercise every week. Children, pregnant women, people who are out of shape, people who are overweight, and older adults may need to consult a health care provider for individual recommendations. If you have any sort of medical condition, be sure to consult your health care provider before starting a new exercise program. WHAT ARE SOME ACTIVITIES THAT CAN HELP ME TO LOSE WEIGHT?   Walking at a rate of at least 4.5 miles an hour.  Jogging or running at a rate of 5 miles per hour.  Biking at a rate of at least 10 miles per hour.  Lap swimming.  Roller-skating or in-line skating.  Cross-country skiing.  Vigorous competitive sports, such as football, basketball, and soccer.  Jumping rope.  Aerobic dancing. HOW CAN I BE MORE ACTIVE IN MY DAY-TO-DAY ACTIVITIES?  Use the stairs instead of the elevator.  Take a walk during your lunch break.  If you drive, park your car farther away from work or school.  If you take public transportation, get off one stop early and walk the rest of the way.  Make all of your phone calls while standing up and walking around.  Get up, stretch, and walk around every 30 minutes throughout the day. WHAT GUIDELINES SHOULD I FOLLOW WHILE EXERCISING?  Do not exercise so much that you hurt yourself, feel dizzy, or get very short of breath.  Consult your health care provider prior to starting a new exercise program.  Wear comfortable clothes and shoes with good support.  Drink plenty of water while you exercise to prevent dehydration or heat stroke.   Body water is lost during exercise and must be replaced.  Work out until you breathe faster and your heart beats faster.   This information is not intended to replace advice given to you by your health care provider. Make sure you discuss any questions you have with your health care provider.   Document Released: 10/05/2010 Document Revised: 09/23/2014 Document  Reviewed: 02/03/2014 Else[employer] Interactive Patient Education 2016 [employer]  

## 2016-05-14 NOTE — Progress Notes (Signed)
Charles Barnett  MRN: [id number] DOB: 07-05-1966  PCP: Dois Davenport., MD  Subjective:  Pt presents to clinic for right knee pain x three weeks. He was moving book shelf when he felt a pop to the outside of his right knee. When incident happened he describes a 10/10 pain. Today his pain is 8/10. States his "whole knee swelled up". He is able to bear weight, but says his pain gets worse when he is active, worse with bending. Describes the location of his pain today mostly on the outside of his knee, radiates down the side of his calf to mid-calf. Tried heat, knee brace, ice, aleve, no relief.   History of knee pain. Had x-rays done last year at [facility] ortho. Dx with a tight hamstring.  Arthritis in both knees.  Pain management gave him a knee brace 3 years ago. Has steroid shots in both knees, last shot was last year.  Has neurostimulator pain pump   He is also here for blood pressure medication refill. Has not tried to lose weight with diet/exercise.   Review of Systems  Constitutional: Negative.   Respiratory: Negative.   Cardiovascular: Negative.   Gastrointestinal: Negative.   Genitourinary: Negative.   Musculoskeletal: Positive for arthralgias, gait problem and joint swelling.  Skin: Negative for wound.  Neurological: Negative for weakness and numbness.    Patient Active Problem List   Diagnosis Date Noted  . Apnea, sleep 01/06/2014  . Cannot sleep 10/01/2013  . Body mass index (BMI) of 50-59.9 in adult (HCC) 06/25/2013  . Postoperative back pain 05/27/2013  . BP (high blood pressure) 05/27/2013  . H/O disease 03/22/2013    Current Outpatient Prescriptions on File Prior to Visit  Medication Sig Dispense Refill  . losartan (COZAAR) 100 MG tablet Take 1 tablet (100 mg total) by mouth daily. 90 tablet 1  . losartan-hydrochlorothiazide (HYZAAR) 100-25 MG tablet Take 1 tablet by mouth daily. 90 tablet 1  . ondansetron (ZOFRAN-ODT) 8 MG disintegrating tablet Take 1 tablet (8  mg total) by mouth every 8 (eight) hours as needed for nausea. 30 tablet 0  . oxymorphone (OPANA ER) 30 MG 12 hr tablet Take 30 mg by mouth every 12 (twelve) hours.    . tapentadol (NUCYNTA) 50 MG TABS tablet Take 100 mg by mouth. Take one 5 times daily    . tiZANidine (ZANAFLEX) 4 MG tablet Take 4 mg by mouth 3 (three) times daily.     No current facility-administered medications on file prior to visit.     Allergies  Allergen Reactions  . Iodinated Diagnostic Agents Swelling  . Ibuprofen   . Iodine     Contrast dye    Objective:  BP 130/80 (BP Location: Right Arm, Patient Position: Sitting, Cuff Size: Large)   Pulse 83   Temp 98.3 F (36.8 C) (Oral)   Resp 17   Ht 6' (1.829 m)   Wt (!) 413 lb (187.3 kg)   SpO2 95%   BMI 56.01 kg/m   Physical Exam  Constitutional: He is oriented to person, place, and time and well-developed, well-nourished, and in no distress. No distress.  Morbidly obese  Cardiovascular: Normal rate, regular rhythm and normal heart sounds.   Pulmonary/Chest: Effort normal. No respiratory distress.  Musculoskeletal:       Right knee: He exhibits decreased range of motion (knee flexion). He exhibits no effusion. Tenderness found. Medial joint line, lateral joint line and patellar tendon tenderness noted.  No swelling appreciated. Patient's quadriceps  are tight, difficult to perform knee exams including McMurray's, valgus and varus stress. TTP medially and laterally. 1+ edema right leg to mid-calf.    Neurological: He is alert and oriented to person, place, and time. GCS score is 15.  Skin: Skin is warm and dry.  Psychiatric: Mood, memory, affect and judgment normal.  Vitals reviewed.   Assessment and Plan :  1. Knee pain, acute, right - Ambulatory referral to Orthopedic Surgery  2. Essential hypertension - losartan-hydrochlorothiazide (HYZAAR) 100-25 MG tablet; Take 1 tablet by mouth daily.  Dispense: 90 tablet; Refill: 1 - COMPLETE METABOLIC PANEL  WITH GFR   Marco CollieWhitney Kalief Kattner, PA-C  [facility] 05/14/2016 12:07 PM

## 2016-05-15 ENCOUNTER — Encounter: Payer: Self-pay | Admitting: Physician Assistant

## 2016-05-15 LAB — COMPLETE METABOLIC PANEL WITH GFR
ALT: 18 U/L (ref 9–46)
AST: 18 U/L (ref 10–35)
BUN: 10 mg/dL (ref 7–25)
Calcium: 10.7 mg/dL — ABNORMAL HIGH (ref 8.6–10.3)
Chloride: 105 mmol/L (ref 98–110)
GFR, Est African American: >89 mL/min (ref >=60)
Total Protein: 7.2 g/dL (ref 6.1–8.1)

## 2016-05-15 LAB — COMPLETE METABOLIC PANEL WITHOUT GFR
Albumin: 4.1 g/dL (ref 3.6–5.1)
Alkaline Phosphatase: 60 U/L (ref 40–115)
CO2: 26 mmol/L (ref 20–31)
Creat: 0.85 mg/dL (ref 0.70–1.33)
GFR, Est Non African American: >89 mL/min (ref >=60)
Glucose, Bld: 96 mg/dL (ref 65–99)
Potassium: 5.1 mmol/L (ref 3.5–5.3)
Sodium: 141 mmol/L (ref 135–146)
Total Bilirubin: 0.5 mg/dL (ref 0.2–1.2)

## 2016-05-15 MED FILL — tiZANidine HCL 4 MG TABS: 4 | 30 days supply | Qty: 90 | Fill #2

## 2016-05-17 ENCOUNTER — Telehealth: Payer: Self-pay | Admitting: Emergency Medicine

## 2016-05-17 ENCOUNTER — Telehealth: Payer: Self-pay

## 2016-05-17 NOTE — Telephone Encounter (Signed)
[facility]  When they tried to schedule the patient, the wife told them that it was supposed to be a referral for an ultrasound.  The patient did not make an appointment due to this.  Please advise for clarification.  Thank you.

## 2016-05-17 NOTE — Telephone Encounter (Signed)
Spoke to patient wife to clarify referral orders. Wife assumed husband neededultrasound of the knee before being seen by Orthopedics.  I informed her [facility], No ultrasound was ordered or needed and to call [place] to schedule appointment.  She verbalized understanding

## 2016-05-21 ENCOUNTER — Other Ambulatory Visit: Payer: Self-pay | Admitting: Physician Assistant

## 2016-05-22 DIAGNOSIS — M47817 Spondylosis without myelopathy or radiculopathy, lumbosacral region: Secondary | ICD-10-CM | POA: Diagnosis not present

## 2016-05-22 DIAGNOSIS — M25569 Pain in unspecified knee: Secondary | ICD-10-CM | POA: Diagnosis not present

## 2016-05-22 DIAGNOSIS — G894 Chronic pain syndrome: Secondary | ICD-10-CM | POA: Diagnosis not present

## 2016-05-22 DIAGNOSIS — M1288 Other specific arthropathies, not elsewhere classified, other specified site: Secondary | ICD-10-CM | POA: Diagnosis not present

## 2016-05-22 MED FILL — NUCYNTA 100 MG TABLET: 100 | 30 days supply | Qty: 120 | Fill #0

## 2016-05-22 MED FILL — oxyCODONE HCL ER 20 MG T12A: 20 | 30 days supply | Qty: 60 | Fill #0

## 2016-05-23 DIAGNOSIS — G4733 Obstructive sleep apnea (adult) (pediatric): Secondary | ICD-10-CM | POA: Diagnosis not present

## 2016-05-23 DIAGNOSIS — R0902 Hypoxemia: Secondary | ICD-10-CM | POA: Diagnosis not present

## 2016-06-22 DIAGNOSIS — R0902 Hypoxemia: Secondary | ICD-10-CM | POA: Diagnosis not present

## 2016-06-22 DIAGNOSIS — G4733 Obstructive sleep apnea (adult) (pediatric): Secondary | ICD-10-CM | POA: Diagnosis not present

## 2016-06-24 DIAGNOSIS — M1288 Other specific arthropathies, not elsewhere classified, other specified site: Secondary | ICD-10-CM | POA: Diagnosis not present

## 2016-06-24 DIAGNOSIS — M25569 Pain in unspecified knee: Secondary | ICD-10-CM | POA: Diagnosis not present

## 2016-06-24 DIAGNOSIS — M47817 Spondylosis without myelopathy or radiculopathy, lumbosacral region: Secondary | ICD-10-CM | POA: Diagnosis not present

## 2016-06-24 DIAGNOSIS — G894 Chronic pain syndrome: Secondary | ICD-10-CM | POA: Diagnosis not present

## 2016-06-24 MED FILL — OxyCONTIN 30 MG T12A: 30 | 30 days supply | Qty: 60 | Fill #0

## 2016-06-24 MED FILL — NUCYNTA 100 MG TABLET: 100 | 30 days supply | Qty: 120 | Fill #0

## 2016-07-23 DIAGNOSIS — G894 Chronic pain syndrome: Secondary | ICD-10-CM | POA: Diagnosis not present

## 2016-07-23 DIAGNOSIS — G4733 Obstructive sleep apnea (adult) (pediatric): Secondary | ICD-10-CM | POA: Diagnosis not present

## 2016-07-23 DIAGNOSIS — R0902 Hypoxemia: Secondary | ICD-10-CM | POA: Diagnosis not present

## 2016-07-23 DIAGNOSIS — M549 Dorsalgia, unspecified: Secondary | ICD-10-CM | POA: Diagnosis not present

## 2016-07-23 DIAGNOSIS — M25569 Pain in unspecified knee: Secondary | ICD-10-CM | POA: Diagnosis not present

## 2016-07-23 DIAGNOSIS — M47817 Spondylosis without myelopathy or radiculopathy, lumbosacral region: Secondary | ICD-10-CM | POA: Diagnosis not present

## 2016-07-23 MED FILL — OxyCONTIN 30 MG T12A: 30 | 30 days supply | Qty: 60 | Fill #0

## 2016-07-23 MED FILL — NUCYNTA 100 MG TABLET: 100 | 30 days supply | Qty: 120 | Fill #0

## 2016-08-09 MED FILL — tiZANidine HCL 4 MG TABS: 4 | 30 days supply | Qty: 90 | Fill #0

## 2016-08-12 DIAGNOSIS — G4733 Obstructive sleep apnea (adult) (pediatric): Secondary | ICD-10-CM | POA: Diagnosis not present

## 2016-08-13 DIAGNOSIS — Z9989 Dependence on other enabling machines and devices: Secondary | ICD-10-CM | POA: Diagnosis not present

## 2016-08-13 DIAGNOSIS — G4733 Obstructive sleep apnea (adult) (pediatric): Secondary | ICD-10-CM | POA: Diagnosis not present

## 2016-08-13 DIAGNOSIS — Z6841 Body Mass Index (BMI) 40.0 and over, adult: Secondary | ICD-10-CM | POA: Diagnosis not present

## 2016-08-20 DIAGNOSIS — M25569 Pain in unspecified knee: Secondary | ICD-10-CM | POA: Diagnosis not present

## 2016-08-20 DIAGNOSIS — M549 Dorsalgia, unspecified: Secondary | ICD-10-CM | POA: Diagnosis not present

## 2016-08-20 DIAGNOSIS — G894 Chronic pain syndrome: Secondary | ICD-10-CM | POA: Diagnosis not present

## 2016-08-20 DIAGNOSIS — M47817 Spondylosis without myelopathy or radiculopathy, lumbosacral region: Secondary | ICD-10-CM | POA: Diagnosis not present

## 2016-08-20 MED FILL — OxyCONTIN 30 MG T12A: 30 | 30 days supply | Qty: 60 | Fill #0

## 2016-08-20 MED FILL — NUCYNTA 100 MG TABLET: 100 | 30 days supply | Qty: 120 | Fill #0

## 2016-08-22 DIAGNOSIS — G4733 Obstructive sleep apnea (adult) (pediatric): Secondary | ICD-10-CM | POA: Diagnosis not present

## 2016-08-22 DIAGNOSIS — R0902 Hypoxemia: Secondary | ICD-10-CM | POA: Diagnosis not present

## 2016-09-17 DIAGNOSIS — G894 Chronic pain syndrome: Secondary | ICD-10-CM | POA: Diagnosis not present

## 2016-09-17 DIAGNOSIS — M25569 Pain in unspecified knee: Secondary | ICD-10-CM | POA: Diagnosis not present

## 2016-09-17 DIAGNOSIS — M549 Dorsalgia, unspecified: Secondary | ICD-10-CM | POA: Diagnosis not present

## 2016-09-17 DIAGNOSIS — M47817 Spondylosis without myelopathy or radiculopathy, lumbosacral region: Secondary | ICD-10-CM | POA: Diagnosis not present

## 2016-09-17 MED FILL — OXYCODONE-APAP 10-325: 10-325 | 30 days supply | Qty: 120 | Fill #0

## 2016-09-17 MED FILL — oxyCODONE HCL ER 40 MG T12A: 40 | 30 days supply | Qty: 60 | Fill #0

## 2016-09-17 MED FILL — LOSARTAN-HCTZ 100-25 MG TAB: 100-25 | 90 days supply | Qty: 90 | Fill #1

## 2016-09-22 DIAGNOSIS — G4733 Obstructive sleep apnea (adult) (pediatric): Secondary | ICD-10-CM | POA: Diagnosis not present

## 2016-09-22 DIAGNOSIS — R0902 Hypoxemia: Secondary | ICD-10-CM | POA: Diagnosis not present

## 2016-10-15 DIAGNOSIS — Z79891 Long term (current) use of opiate analgesic: Secondary | ICD-10-CM | POA: Diagnosis not present

## 2016-10-15 DIAGNOSIS — M25569 Pain in unspecified knee: Secondary | ICD-10-CM | POA: Diagnosis not present

## 2016-10-15 DIAGNOSIS — Z79899 Other long term (current) drug therapy: Secondary | ICD-10-CM | POA: Diagnosis not present

## 2016-10-15 DIAGNOSIS — G894 Chronic pain syndrome: Secondary | ICD-10-CM | POA: Diagnosis not present

## 2016-10-15 DIAGNOSIS — M47817 Spondylosis without myelopathy or radiculopathy, lumbosacral region: Secondary | ICD-10-CM | POA: Diagnosis not present

## 2016-10-15 DIAGNOSIS — M549 Dorsalgia, unspecified: Secondary | ICD-10-CM | POA: Diagnosis not present

## 2016-10-15 MED FILL — OXYCODONE-APAP 10-325: 10-325 | 30 days supply | Qty: 120 | Fill #0

## 2016-10-15 MED FILL — OxyCONTIN 20 MG T12A: 20 | 30 days supply | Qty: 60 | Fill #0

## 2016-10-18 ENCOUNTER — Other Ambulatory Visit (INDEPENDENT_AMBULATORY_CARE_PROVIDER_SITE_OTHER): Payer: Self-pay | Admitting: Orthopaedic Surgery

## 2016-10-18 ENCOUNTER — Encounter (INDEPENDENT_AMBULATORY_CARE_PROVIDER_SITE_OTHER): Payer: Self-pay | Admitting: Orthopaedic Surgery

## 2016-10-18 ENCOUNTER — Ambulatory Visit (HOSPITAL_BASED_OUTPATIENT_CLINIC_OR_DEPARTMENT_OTHER)
Admission: RE | Admit: 2016-10-18 | Discharge: 2016-10-18 | Disposition: A | Discharge status: Home / Self Care | Payer: 59 | Source: Ambulatory Visit | Attending: Orthopaedic Surgery | Admitting: Orthopaedic Surgery

## 2016-10-18 ENCOUNTER — Ambulatory Visit (INDEPENDENT_AMBULATORY_CARE_PROVIDER_SITE_OTHER): Payer: 59 | Admitting: Orthopaedic Surgery

## 2016-10-18 DIAGNOSIS — M25512 Pain in left shoulder: Secondary | ICD-10-CM

## 2016-10-18 DIAGNOSIS — M25561 Pain in right knee: Secondary | ICD-10-CM | POA: Insufficient documentation

## 2016-10-18 DIAGNOSIS — M25461 Effusion, right knee: Secondary | ICD-10-CM | POA: Diagnosis not present

## 2016-10-18 MED ORDER — LIDOCAINE HCL 1 % IJ SOLN
3.0000 mL | INTRAMUSCULAR | Status: AC | PRN
  Administered 2016-10-18: 3 mL

## 2016-10-18 MED ORDER — BUPIVACAINE HCL 0.5 % IJ SOLN
3.0000 mL | INTRAMUSCULAR | Status: AC | PRN
  Administered 2016-10-18: 3 mL via INTRA_ARTICULAR

## 2016-10-18 MED ORDER — METHYLPREDNISOLONE ACETATE 40 MG/ML IJ SUSP
40.0000 mg | INTRAMUSCULAR | Status: AC | PRN
  Administered 2016-10-18: 40 mg via INTRA_ARTICULAR

## 2016-10-18 NOTE — Progress Notes (Signed)
Office Visit Note   Patient: Charles Barnett           Date of Birth: 1966/06/10           MRN: [id number] Visit Date: 10/18/2016              Requested by: Charles DavenportKaren L Richter, MD [address] [place], [place] [address] PCP: Charles DavenportICHTER,Charles L., MD   Assessment & Plan: Visit Diagnoses:  1. Left shoulder pain, unspecified chronicity   2. Right knee pain, unspecified chronicity     Plan: counseled him on importance of weight loss, referral to Dr. Dalbert Barnett for weight loss.  monovisc application submitted.  Left shoulder injection given.  He has more of impingement syndrome.    Follow-Up Instructions: Return if symptoms worsen or fail to improve.   Orders:  Orders Placed This Encounter  Procedures  . Large Joint Injection/Arthrocentesis  . DG Shoulder Left  . DG Knee 3 Views Right  . Amb Referral to Bariatric Surgery   No orders of the defined types were placed in this encounter.     Procedures: Large Joint Inj Date/Time: 10/18/2016 11:08 AM Performed by: Charles Barnett, Charles Barnett Authorized by: Charles Barnett, Charles Barnett   Consent Given by:  Patient Timeout: prior to procedure the correct patient, procedure, and site was verified   Location:  Shoulder Site:  L subacromial bursa Prep: patient was prepped and draped in usual sterile fashion   Needle Size:  22 G Approach:  Posterior Ultrasound Guidance: No   Fluoroscopic Guidance: No   Arthrogram: No   Medications:  3 mL lidocaine 1 %; 3 mL bupivacaine 0.5 %; 40 mg methylPREDNISolone acetate 40 MG/ML     Clinical Data: No additional findings.   Subjective: Chief Complaint  Patient presents with  . Left Shoulder - Pain  . Right Knee - Pain    51 yo male morbidly obese who comes in with left shoulder pain, right knee pain without previous injuries.  Pain is throbbing in the knee worse with activity and weight bearing.  Shoulder pain is worse with elevation of arm, denies radicular pain.  Has had cortisone injections in knee but none in  shoulder.      Review of Systems  Constitutional: Negative.   All other systems reviewed and are negative.    Objective: Vital Signs: There were no vitals taken for this visit.  Physical Exam  Constitutional: He is oriented to person, place, and time. He appears well-developed and well-nourished.  HENT:  Head: Normocephalic and atraumatic.  Eyes: Pupils are equal, round, and reactive to light.  Neck: Neck supple.  Pulmonary/Chest: Effort normal.  Abdominal: Soft.  Musculoskeletal: Normal range of motion.  Neurological: He is alert and oriented to person, place, and time.  Skin: Skin is warm.  Psychiatric: He has a normal mood and affect. His behavior is normal. Judgment and thought content normal.  Nursing note and vitals reviewed.   Ortho Exam  Specialty Comments:  No specialty comments available.  Imaging: No results found.   PMFS History: Patient Active Problem List   Diagnosis Date Noted  . Left shoulder pain 10/18/2016  . Right knee pain 10/18/2016  . Apnea, sleep 01/06/2014  . Cannot sleep 10/01/2013  . Body mass index (BMI) of 50-59.9 in adult (HCC) 06/25/2013  . Postoperative back pain 05/27/2013  . BP (high blood pressure) 05/27/2013  . H/O disease 03/22/2013   Past Medical History:  Diagnosis Date  . Arthritis   .  Hypertension   . Neuromuscular disorder (HCC)     Family History  Problem Relation Age of Onset  . Diabetes Mother   . Hyperlipidemia Mother   . Hypertension Mother   . Heart disease Mother   . Diabetes Sister   . Hypertension Sister   . Heart disease Sister     Past Surgical History:  Procedure Laterality Date  . SPINE SURGERY     Social History   Occupational History  . Not on file.   Social History Main Topics  . Smoking status: Never Smoker  . Smokeless tobacco: Never Used  . Alcohol use No  . Drug use: No  . Sexual activity: Yes    Partners: Female

## 2016-10-21 NOTE — Addendum Note (Signed)
Addended by: Albertina ParrGARCIA, [place] Grundman on: 10/21/2016 09:26 AM   Modules accepted: Orders

## 2016-10-23 DIAGNOSIS — G4733 Obstructive sleep apnea (adult) (pediatric): Secondary | ICD-10-CM | POA: Diagnosis not present

## 2016-10-23 DIAGNOSIS — R0902 Hypoxemia: Secondary | ICD-10-CM | POA: Diagnosis not present

## 2016-10-30 ENCOUNTER — Telehealth (INDEPENDENT_AMBULATORY_CARE_PROVIDER_SITE_OTHER): Payer: Self-pay

## 2016-10-30 NOTE — Telephone Encounter (Signed)
Called pt to let him know he was approved for the Monovisc Inj for the right knee. He just needs to call us to schedule appoinment with us here in [place] location or in [place] on Friday mornings. Thank you. Please let me know if pt calls back to schedule appt. Thank you.

## 2016-10-31 DIAGNOSIS — G4733 Obstructive sleep apnea (adult) (pediatric): Secondary | ICD-10-CM | POA: Diagnosis not present

## 2016-11-12 DIAGNOSIS — M47817 Spondylosis without myelopathy or radiculopathy, lumbosacral region: Secondary | ICD-10-CM | POA: Diagnosis not present

## 2016-11-12 DIAGNOSIS — M25519 Pain in unspecified shoulder: Secondary | ICD-10-CM | POA: Diagnosis not present

## 2016-11-12 DIAGNOSIS — G894 Chronic pain syndrome: Secondary | ICD-10-CM | POA: Diagnosis not present

## 2016-11-12 DIAGNOSIS — M25569 Pain in unspecified knee: Secondary | ICD-10-CM | POA: Diagnosis not present

## 2016-11-12 MED FILL — OXYCODONE-ACETAMINOPHEN 10-: 10-325 | 30 days supply | Qty: 120 | Fill #0

## 2016-11-12 MED FILL — OxyCONTIN 20 MG T12A: 20 | 30 days supply | Qty: 60 | Fill #0

## 2016-11-20 DIAGNOSIS — G4733 Obstructive sleep apnea (adult) (pediatric): Secondary | ICD-10-CM | POA: Diagnosis not present

## 2016-11-20 DIAGNOSIS — R0902 Hypoxemia: Secondary | ICD-10-CM | POA: Diagnosis not present

## 2016-11-22 ENCOUNTER — Telehealth (INDEPENDENT_AMBULATORY_CARE_PROVIDER_SITE_OTHER): Payer: Self-pay

## 2016-11-22 NOTE — Telephone Encounter (Signed)
LMOM- can make appt to get monovisc inj. It was approved.

## 2016-11-27 ENCOUNTER — Telehealth (INDEPENDENT_AMBULATORY_CARE_PROVIDER_SITE_OTHER): Payer: Self-pay

## 2016-11-27 NOTE — Telephone Encounter (Signed)
Called pt to let him know Monovisc was approved and just calling to schedule appt if he still wants to do injection. LMOM to return our call.If he calls  Please schedule appt. Thank you.

## 2016-12-09 DIAGNOSIS — M549 Dorsalgia, unspecified: Secondary | ICD-10-CM

## 2016-12-09 DIAGNOSIS — G8918 Other acute postprocedural pain: Secondary | ICD-10-CM

## 2016-12-10 DIAGNOSIS — M47817 Spondylosis without myelopathy or radiculopathy, lumbosacral region: Secondary | ICD-10-CM | POA: Diagnosis not present

## 2016-12-10 DIAGNOSIS — M25519 Pain in unspecified shoulder: Secondary | ICD-10-CM | POA: Diagnosis not present

## 2016-12-10 DIAGNOSIS — M549 Dorsalgia, unspecified: Secondary | ICD-10-CM | POA: Diagnosis not present

## 2016-12-10 DIAGNOSIS — G894 Chronic pain syndrome: Secondary | ICD-10-CM | POA: Diagnosis not present

## 2016-12-10 MED FILL — tiZANidine HCL 4 MG TABS: 4 | 30 days supply | Qty: 30 | Fill #0

## 2016-12-10 MED FILL — OXYCODONE-ACETAMINOPHEN 10-: 10-325 | 30 days supply | Qty: 120 | Fill #0

## 2016-12-10 MED FILL — OxyCONTIN 20 MG T12A: 20 | 30 days supply | Qty: 60 | Fill #0

## 2016-12-15 DIAGNOSIS — I4892 Unspecified atrial flutter: Secondary | ICD-10-CM

## 2016-12-15 HISTORY — DX: Unspecified atrial flutter: I48.92

## 2016-12-21 DIAGNOSIS — G4733 Obstructive sleep apnea (adult) (pediatric): Secondary | ICD-10-CM | POA: Diagnosis not present

## 2016-12-21 DIAGNOSIS — R0902 Hypoxemia: Secondary | ICD-10-CM | POA: Diagnosis not present

## 2017-01-07 ENCOUNTER — Ambulatory Visit (INDEPENDENT_AMBULATORY_CARE_PROVIDER_SITE_OTHER): Payer: 59 | Admitting: Physician Assistant

## 2017-01-07 ENCOUNTER — Emergency Department (HOSPITAL_COMMUNITY): Payer: 59

## 2017-01-07 ENCOUNTER — Observation Stay (HOSPITAL_COMMUNITY)
Admission: EM | Admit: 2017-01-07 | Discharge: 2017-01-09 | Disposition: A | Discharge status: Home / Self Care | Payer: 59 | Attending: Internal Medicine | Admitting: Internal Medicine

## 2017-01-07 VITALS — BP 124/84 | HR 73 | Temp 98.6°F | Resp 18 | Ht 72.0 in | Wt >= 6400 oz

## 2017-01-07 DIAGNOSIS — R000 Tachycardia, unspecified: Secondary | ICD-10-CM | POA: Diagnosis not present

## 2017-01-07 DIAGNOSIS — G4733 Obstructive sleep apnea (adult) (pediatric): Secondary | ICD-10-CM | POA: Diagnosis not present

## 2017-01-07 DIAGNOSIS — I5031 Acute diastolic (congestive) heart failure: Secondary | ICD-10-CM | POA: Diagnosis not present

## 2017-01-07 DIAGNOSIS — Z91041 Radiographic dye allergy status: Secondary | ICD-10-CM | POA: Diagnosis not present

## 2017-01-07 DIAGNOSIS — Z9989 Dependence on other enabling machines and devices: Secondary | ICD-10-CM | POA: Diagnosis not present

## 2017-01-07 DIAGNOSIS — I4892 Unspecified atrial flutter: Secondary | ICD-10-CM

## 2017-01-07 DIAGNOSIS — I509 Heart failure, unspecified: Secondary | ICD-10-CM | POA: Diagnosis not present

## 2017-01-07 DIAGNOSIS — I10 Essential (primary) hypertension: Secondary | ICD-10-CM | POA: Diagnosis not present

## 2017-01-07 DIAGNOSIS — G8929 Other chronic pain: Secondary | ICD-10-CM | POA: Insufficient documentation

## 2017-01-07 DIAGNOSIS — M545 Low back pain: Secondary | ICD-10-CM | POA: Insufficient documentation

## 2017-01-07 DIAGNOSIS — Z79899 Other long term (current) drug therapy: Secondary | ICD-10-CM | POA: Insufficient documentation

## 2017-01-07 DIAGNOSIS — Z888 Allergy status to other drugs, medicaments and biological substances status: Secondary | ICD-10-CM | POA: Insufficient documentation

## 2017-01-07 DIAGNOSIS — R059 Cough, unspecified: Secondary | ICD-10-CM

## 2017-01-07 DIAGNOSIS — R0609 Other forms of dyspnea: Secondary | ICD-10-CM

## 2017-01-07 DIAGNOSIS — I4891 Unspecified atrial fibrillation: Secondary | ICD-10-CM | POA: Diagnosis not present

## 2017-01-07 DIAGNOSIS — M1990 Unspecified osteoarthritis, unspecified site: Secondary | ICD-10-CM | POA: Diagnosis not present

## 2017-01-07 DIAGNOSIS — I499 Cardiac arrhythmia, unspecified: Secondary | ICD-10-CM | POA: Diagnosis not present

## 2017-01-07 DIAGNOSIS — Z8249 Family history of ischemic heart disease and other diseases of the circulatory system: Secondary | ICD-10-CM | POA: Insufficient documentation

## 2017-01-07 DIAGNOSIS — I110 Hypertensive heart disease with heart failure: Secondary | ICD-10-CM | POA: Diagnosis not present

## 2017-01-07 DIAGNOSIS — R05 Cough: Secondary | ICD-10-CM

## 2017-01-07 DIAGNOSIS — I517 Cardiomegaly: Secondary | ICD-10-CM | POA: Diagnosis not present

## 2017-01-07 DIAGNOSIS — Z6841 Body Mass Index (BMI) 40.0 and over, adult: Secondary | ICD-10-CM

## 2017-01-07 HISTORY — DX: Sleep apnea, unspecified: G47.30

## 2017-01-07 HISTORY — DX: Unspecified atrial flutter: I48.92

## 2017-01-07 LAB — HEPATIC FUNCTION PANEL
ALT: 30 U/L (ref 17–63)
AST: 29 U/L (ref 15–41)
Albumin: 3.7 g/dL (ref 3.5–5.0)
Alkaline Phosphatase: 60 U/L (ref 38–126)
BILIRUBIN DIRECT: 0.2 mg/dL (ref 0.1–0.5)
BILIRUBIN INDIRECT: 0.8 mg/dL (ref 0.3–0.9)
Total Bilirubin: 1 mg/dL (ref 0.3–1.2)
Total Protein: 7.4 g/dL (ref 6.5–8.1)

## 2017-01-07 LAB — MAGNESIUM: MAGNESIUM: 1.8 mg/dL (ref 1.7–2.4)

## 2017-01-07 LAB — PHOSPHORUS: PHOSPHORUS: 2.1 mg/dL — AB (ref 2.5–4.6)

## 2017-01-07 LAB — BASIC METABOLIC PANEL
ANION GAP: 8 (ref 5–15)
BUN: 8 mg/dL (ref 6–20)
CALCIUM: 10.6 mg/dL — AB (ref 8.9–10.3)
CO2: 25 mmol/L (ref 22–32)
CREATININE: 0.9 mg/dL (ref 0.61–1.24)
Chloride: 104 mmol/L (ref 101–111)
GFR calc Af Amer: >60 mL/min (ref >60)
GFR calc non Af Amer: >60 mL/min (ref >60)
GLUCOSE: 102 mg/dL — AB (ref 65–99)
Potassium: 4.1 mmol/L (ref 3.5–5.1)
Sodium: 137 mmol/L (ref 135–145)

## 2017-01-07 LAB — POCT CBC
Granulocyte percent: 53.9 %G (ref 37–80)
HCT, POC: 43.9 % (ref 43.5–53.7)
HEMOGLOBIN: 14.6 g/dL (ref 14.1–18.1)
Lymph, poc: 3.7 — AB (ref 0.6–3.4)
MCH: 27.7 pg (ref 27–31.2)
MCHC: 33.2 g/dL (ref 31.8–35.4)
MCV: 83.5 fL (ref 80–97)
MID (cbc): 0.7 (ref 0–0.9)
MPV: 7.3 fL (ref 0–99.8)
PLATELET COUNT, POC: 278 10*3/uL (ref 142–424)
POC Granulocyte: 5.1 (ref 2–6.9)
POC LYMPH PERCENT: 38.6 %L (ref 10–50)
POC MID %: 7.5 % (ref 0–12)
RBC: 5.26 M/uL (ref 4.69–6.13)
RDW, POC: 15 %
WBC: 9.5 10*3/uL (ref 4.6–10.2)

## 2017-01-07 LAB — CBC
HCT: 46.1 % (ref 39.0–52.0)
HEMOGLOBIN: 14.7 g/dL (ref 13.0–17.0)
MCH: 27.7 pg (ref 26.0–34.0)
MCHC: 31.9 g/dL (ref 30.0–36.0)
MCV: 87 fL (ref 78.0–100.0)
Platelets: 243 10*3/uL (ref 150–400)
RBC: 5.3 MIL/uL (ref 4.22–5.81)
RDW: 14.2 % (ref 11.5–15.5)
WBC: 10.3 10*3/uL (ref 4.0–10.5)

## 2017-01-07 LAB — TROPONIN I

## 2017-01-07 LAB — HEPARIN LEVEL (UNFRACTIONATED)

## 2017-01-07 LAB — TSH: TSH: 2.35 u[IU]/mL (ref 0.350–4.500)

## 2017-01-07 MED ORDER — DILTIAZEM HCL-DEXTROSE 100-5 MG/100ML-% IV SOLN (PREMIX)
5.0000 mg/h | INTRAVENOUS | Status: DC
  Administered 2017-01-07 (×2): 5 mg/h via INTRAVENOUS
  Filled 2017-01-07 (×2): qty 100

## 2017-01-07 MED ORDER — DILTIAZEM LOAD VIA INFUSION
20.0000 mg | Freq: Once | INTRAVENOUS | Status: AC
  Administered 2017-01-07: 20 mg via INTRAVENOUS
  Filled 2017-01-07: qty 20

## 2017-01-07 MED ORDER — ACETAMINOPHEN 650 MG RE SUPP: 650.0000 mg | Freq: Four times a day (QID) | RECTAL | Status: DC | PRN

## 2017-01-07 MED ORDER — FUROSEMIDE 10 MG/ML IJ SOLN
20.0000 mg | Freq: Two times a day (BID) | INTRAMUSCULAR | Status: AC
  Administered 2017-01-07 – 2017-01-08 (×2): 20 mg via INTRAVENOUS
  Filled 2017-01-07 (×2): qty 2

## 2017-01-07 MED ORDER — ASPIRIN 81 MG PO CHEW
324.0000 mg | CHEWABLE_TABLET | Freq: Once | ORAL | Status: AC
  Administered 2017-01-07: 324 mg via ORAL
  Filled 2017-01-07: qty 4

## 2017-01-07 MED ORDER — OXYCODONE-ACETAMINOPHEN 5-325 MG PO TABS
1.0000 | ORAL_TABLET | Freq: Four times a day (QID) | ORAL | Status: DC | PRN
  Administered 2017-01-07 – 2017-01-09 (×3): 1 via ORAL
  Filled 2017-01-07 (×3): qty 1

## 2017-01-07 MED ORDER — SENNOSIDES-DOCUSATE SODIUM 8.6-50 MG PO TABS: 1.0000 | ORAL_TABLET | Freq: Every evening | ORAL | Status: DC | PRN

## 2017-01-07 MED ORDER — ASPIRIN EC 81 MG PO TBEC
81.0000 mg | DELAYED_RELEASE_TABLET | Freq: Every day | ORAL | Status: DC
  Administered 2017-01-08 – 2017-01-09 (×2): 81 mg via ORAL
  Filled 2017-01-07 (×2): qty 1

## 2017-01-07 MED ORDER — OXYCODONE HCL 5 MG PO TABS
5.0000 mg | ORAL_TABLET | Freq: Four times a day (QID) | ORAL | Status: DC | PRN
  Administered 2017-01-07 – 2017-01-08 (×2): 5 mg via ORAL
  Filled 2017-01-07 (×2): qty 1

## 2017-01-07 MED ORDER — OXYCODONE HCL ER 20 MG PO T12A
20.0000 mg | EXTENDED_RELEASE_TABLET | Freq: Two times a day (BID) | ORAL | Status: DC
  Administered 2017-01-07 – 2017-01-09 (×4): 20 mg via ORAL
  Filled 2017-01-07 (×4): qty 2

## 2017-01-07 MED ORDER — OXYCODONE-ACETAMINOPHEN 10-325 MG PO TABS: 1.0000 | ORAL_TABLET | Freq: Four times a day (QID) | ORAL | Status: DC | PRN

## 2017-01-07 MED ORDER — RIVAROXABAN 20 MG PO TABS
20.0000 mg | ORAL_TABLET | Freq: Every day | ORAL | Status: DC
  Administered 2017-01-07 – 2017-01-08 (×2): 20 mg via ORAL
  Filled 2017-01-07 (×2): qty 1

## 2017-01-07 MED ORDER — SODIUM CHLORIDE 0.9% FLUSH: 3.0000 mL | Freq: Two times a day (BID) | INTRAVENOUS | Status: DC

## 2017-01-07 MED ORDER — ACETAMINOPHEN 325 MG PO TABS: 650.0000 mg | ORAL_TABLET | Freq: Four times a day (QID) | ORAL | Status: DC | PRN

## 2017-01-07 MED ORDER — HEPARIN SODIUM (PORCINE) 5000 UNIT/ML IJ SOLN: 5000.0000 [IU] | Freq: Three times a day (TID) | INTRAMUSCULAR | Status: DC

## 2017-01-07 NOTE — H&P (Signed)
Date: 01/07/2017               Patient Name:  Charles Barnett MRN: [id number]  DOB: 1965-12-26 Age / Sex: 51 y.o., male   PCP: Dois Davenport, MD         Medical Service: Internal Medicine Teaching Service         Attending Physician: Dr. Jacalyn Lefevre, MD    First Contact: Dr. Carolynn Comment Pager: [phone]  Second Contact: Dr. Deneise Lever Pager: [phone]       After Hours (After 5p /  First Contact Pager: [phone]  Weekends / Holidays): Second Contact Pager: [phone]   Chief Complaint: Atrial flutter  History of Present Illness: [name] is a 51 y.o. male with a h/o of HTN who presents with atrial flutter and rapid rate.  Pt was seen in the Urgent Care today for 1 wk h/o chest pain and cough where he was found to be in A-flutter with rapid rate. He was referred to the ED for further evaluation.  Patient reports that he has had upper respiratory infection symptoms for the last 4 weeks. His wife is also had similar symptoms. He notes that he has had some chest discomfort associated with coughing spells, this is not been associated with any exertion. He also notes some shortness of breath which is also associated with coughing spells. His shortness of breath is also noted on exertion with significant dyspnea that has worsened over the last week. Patient denies any symptoms of palpitation, lightheadedness. He does note some headaches over the last week. Patient has no history of palpitations. He denies any swelling in his lower extremities. Patient's only home medications are for hypertension and chronic pain which he takes regularly.  In the ED pt had rates around 100bpm. He was treated with diltiazem loading dose and gtt. He was HDS w/ BPs around 100s/60-70s. IMTS was called for admission.  Meds: Current Outpatient Prescriptions  Medication Sig Dispense Refill  . losartan-hydrochlorothiazide (HYZAAR) 100-25 MG tablet Take 1 tablet by mouth daily. 90 tablet 1  .  oxyCODONE (OXYCONTIN) 40 mg 12 hr tablet Take 20 mg by mouth every 12 (twelve) hours.   0  . oxyCODONE-acetaminophen (PERCOCET) 10-325 MG tablet Take 1 tablet by mouth every 6 (six) hours as needed for pain.     Marland Kitchen tiZANidine (ZANAFLEX) 4 MG tablet Take 4 mg by mouth 3 (three) times daily as needed for muscle spasms.      Allergies: Allergies as of 01/07/2017 - Review Complete 01/07/2017  Allergen Reaction Noted  . Ibuprofen Other (See Comments) 08/08/2015  . Iodine Swelling 08/08/2015  . Iodinated diagnostic agents Swelling 10/10/2015   Past Medical History:  Diagnosis Date  . Arthritis   . Hypertension   . Neuromuscular disorder (HCC)    Family History: Pt family history includes Diabetes in his mother and sister; Heart disease in his mother and sister; Hyperlipidemia in his mother; Hypertension in his mother and sister.  Social History: Pt  reports that he has never smoked. He has never used smokeless tobacco. He reports that he does not drink alcohol or use drugs.  Review of Systems: A complete ROS was negative except as per HPI. Review of Systems  Constitutional: Negative for chills, fever and weight loss.  HENT: Positive for congestion.   Eyes: Negative for blurred vision.  Respiratory: Positive for cough and shortness of breath.   Cardiovascular: Positive for chest pain. Negative for leg swelling.  Gastrointestinal: Negative for abdominal pain, constipation, diarrhea, nausea and vomiting.  Genitourinary: Negative for dysuria, frequency and urgency.  Musculoskeletal: Negative for myalgias.  Skin: Negative for rash.  Neurological: Negative for dizziness, tremors and headaches.  Endo/Heme/Allergies: Negative for polydipsia.  Psychiatric/Behavioral: The patient is not nervous/anxious.    Physical Exam: Vitals:   01/07/17 1308 01/07/17 1330 01/07/17 1400 01/07/17 1430  BP: 102/72 91/76 102/64 100/61  Pulse: 79 80 79 80  Resp: 18 19 (!) 22 19  Temp:      TempSrc:        SpO2: 95% 95% 94% 94%   Physical Exam  Constitutional: He is oriented to person, place, and time. He appears well-developed and well-nourished. He is cooperative. No distress.  Morbidly obese male of state age lying in bed in NAD.  HENT:  Head: Normocephalic and atraumatic.  Right Ear: Hearing normal.  Left Ear: Hearing normal.  Nose: Nose normal.  Mouth/Throat: Mucous membranes are normal.  Cardiovascular: Normal rate, S1 normal, S2 normal and intact distal pulses.  An irregular rhythm present. Exam reveals no gallop.   No murmur heard. Pulmonary/Chest: Effort normal and breath sounds normal. No respiratory distress. He has no wheezes. He has no rhonchi. He has no rales. He exhibits no tenderness.  Abdominal: Soft. Normal appearance and bowel sounds are normal. He exhibits no ascites. There is no hepatosplenomegaly. There is no tenderness.  Neurological: He is alert and oriented to person, place, and time. He has normal strength.  Skin: Skin is warm, dry and intact. He is not diaphoretic.  Psychiatric: He has a normal mood and affect. His speech is normal and behavior is normal.   Labs: CBC:  Recent Labs Lab 01/07/17 1028 01/07/17 1210  WBC 9.5 10.3  HGB 14.6 14.7  HCT 43.9 46.1  MCV 83.5 87.0  PLT  --  243   Basic Metabolic Panel:  Recent Labs Lab 01/07/17 1210  NA 137  K 4.1  CL 104  CO2 25  GLUCOSE 102*  BUN 8  CREATININE 0.90  CALCIUM 10.6*   Cardiac Enzymes:  Recent Labs Lab 01/07/17 1210  TROPONINI <0.03   CBG: Lab Results  Component Value Date   HGBA1C 5.9 (H) 08/08/2015   Imaging: EKG Interpretation  Date/Time:  Tuesday January 07 2017 11:42:14 EDT Ventricular Rate:  105 PR Interval:    QRS Duration: 94 QT Interval:  399 QTC Calculation: 540 R Axis:   -57 Text Interpretation:  Atrial flutter Inferior infarct, acute (LCx) Probable anterior infarct, age indeterminate Lateral leads are also involved Prolonged QT interval Confirmed by Particia Nearing  MD, JULIE ([id number]) on 01/07/2017 12:14:08 PM  Dg Chest 2 View Result Date: 01/07/2017 CLINICAL DATA:  Cough and congestion. EXAM: CHEST  2 VIEW COMPARISON:  No prior. FINDINGS: Cardiomegaly with mild pulmonary interstitial prominence. Mild interstitial pneumonitis cannot be excluded. Low lung volumes. No pleural effusion pneumothorax. Neurostimulator noted projected over the thoracic spine. IMPRESSION: 1. Low lung volumes. 2. Mild cardiomegaly and mild interstitial prominence. Mild interstitial edema and/or pneumonitis cannot be excluded. Electronically Signed   By: Maisie Fus  Register   On: 01/07/2017 12:36   Assessment & Plan by Problem: Active Problems:   Atrial flutter [facility])  [name] is a 51 y.o. male with h/o HTN who was found to have atrial flutter with rapid rate.  1) New onset atrial flutter: pt w/ rates now controlled on diltiazem gtt. Unclear provoking factor. Has had subacute URI symptoms with atypical CP associated with  coughing, but also SOB w/ coughing and also with activity x 1 week. Negative POC trop, some concern for ischemia on initial EKG but difficult to interpret d/t rapid rate, will repeat when rates controlled. CHA2DS2-VASc 2 for HTN and h/o DM. - admit to tele - echocardiogram - repeat EKG  - TSH, mg, phos - trend CMP w/ Ca and albumin - dilt gtt, titrate to control rates - heparin ggt vs NOAC - Cardiology c/s, appreciate recs  2) Hypercalcemia: Pt found to have elevated Ca confirmed on prior records in CareEverywhere. Albumin has been normal in the past, will confirm today. Could be etiology for atrial flutter? Denies any other symptoms of elevated Ca. - consider PTH  3) HTN: Pt w/ normotensive to soft pressures on presentation. Home meds: losartan-HCTZ 100-25mg  qD. Hold home meds for now. - dilt ggt as above - restart home meds for HTN  4) Chronic low-back pain: Patient has long-standing history of low back pain with spinal stimulator in place and 20 mg  OxyContin + 3 times daily 10-325 mg Percocet. Continue patient's home regimen.  DVT PPx - heparin  Code Status - Full  Consults Placed - Cardiology  Dispo: Admit patient to Observation with expected length of stay less than 2 midnights.  Signed: Carolynn Comment, MD 01/07/2017, 3:00 PM  Pager: [phone]

## 2017-01-07 NOTE — Progress Notes (Signed)
ANTICOAGULATION CONSULT NOTE - Initial Consult  Pharmacy Consult for Rivaroxaban (Xarelto) Indication: atrial fibrillation  Allergies  Allergen Reactions  . Ibuprofen Other (See Comments)    "bloody stools"  . Iodine Swelling    Contrast dye  . Iodinated Diagnostic Agents Swelling    Patient Measurements: Height: 6' (182.9 cm) Weight: (!) 414 lb 7.4 oz (188 kg) IBW/kg (Calculated) : 77.6   Vital Signs: Temp: 97.6 F (36.4 C) (04/24 1626) Temp Source: Oral (04/24 1626) BP: 112/70 (04/24 1626) Pulse Rate: 79 (04/24 1626)  Labs:  Recent Labs  01/07/17 1028 01/07/17 1210  HGB 14.6 14.7  HCT 43.9 46.1  PLT  --  243  CREATININE  --  0.90  TROPONINI  --  <0.03    Estimated Creatinine Clearance: 167.3 mL/min (by C-G formula based on SCr of 0.9 mg/dL).   Medical History: Past Medical History:  Diagnosis Date  . Arthritis   . Hypertension   . Neuromuscular disorder (HCC)     Medications:  Prescriptions Prior to Admission  Medication Sig Dispense Refill Last Dose  . losartan-hydrochlorothiazide (HYZAAR) 100-25 MG tablet Take 1 tablet by mouth daily. 90 tablet 1 01/07/2017 at Unknown time  . oxyCODONE (OXYCONTIN) 40 mg 12 hr tablet Take 20 mg by mouth every 12 (twelve) hours.   0 01/07/2017 at Unknown time  . oxyCODONE-acetaminophen (PERCOCET) 10-325 MG tablet Take 1 tablet by mouth every 6 (six) hours as needed for pain.    01/07/2017 at Unknown time  . tiZANidine (ZANAFLEX) 4 MG tablet Take 4 mg by mouth 3 (three) times daily as needed for muscle spasms.    Past Week at Unknown time   Scheduled:  . [START ON 01/08/2017] aspirin EC  81 mg Oral Daily  . furosemide  20 mg Intravenous BID  . oxyCODONE  20 mg Oral Q12H  . sodium chloride flush  3 mL Intravenous Q12H    Assessment:  51 y.o male with a h/o of HTN who presented to ED today 01/07/17 with atrial flutter and rapid rate. Cardiology consulted.  Dr. Rennis Golden notes that data on the use of novel oral anticoagulants  is limited in population with his weight. He notes that options include either starting a novel oral anticoagulant and checking a peak anti-Xa level about 2.5 hours after the dose, versus using heparin and warfarin. He prefers the novel oral anticoagulant strategy and has found good response to Xarelto 20 mg daily in this patient population.  Dr. Rennis Golden orders that we check peak anti-Xa level 2.5 hours after 1st dose to determine if in published range for medication as ordered per cardiologist.   Goal of Therapy:  Monitor platelets by anticoagulation protocol: Yes   Plan:  Xarelto 20 mg po daily with supper, starting tonight.  Check peak anti-Xa level 2.5 hours after 1st dose  Noah Delaine, RPh Clinical Pharmacist Pager: [phone]-[phone] [phone]-[phone] [phone] Main Pharmacy [phone] 01/07/2017,6:07 PM

## 2017-01-07 NOTE — Patient Instructions (Signed)
     IF you received an x-ray today, you will receive an invoice from [facility]. Please contact [facility] at [phone] with questions or concerns regarding your invoice.   IF you received labwork today, you will receive an invoice from [facility]. Please contact [facility] at [phone] with questions or concerns regarding your invoice.   Our billing staff will not be able to assist you with questions regarding bills from these companies.  You will be contacted with the lab results as soon as they are available. The fastest way to get your results is to activate your My Chart account. Instructions are located on the last page of this paperwork. If you have not heard from us regarding the results in 2 weeks, please contact this office.     

## 2017-01-07 NOTE — ED Triage Notes (Signed)
Patient from [facility] with [facility] for atrial flutter.  Patient was being seen for a persistent cough that started approximately one month ago.  Patient also complains of 2/10 chest pain that started two weeks ago.  Patient is alert and oriented and in no apparent distress at this time.  Patient denies cardiac history.

## 2017-01-07 NOTE — Consult Note (Signed)
CONSULTATION NOTE   Patient Name: Charles Barnett Date of Encounter: 01/07/2017 Cardiologist: None (NEW)  Hospital Problem List   Active Problems:   Atrial flutter [facility])    HPI   Charles Barnett is a 51 y.o. male who is being seen today for the evaluation of atrial flutter at the request of Dr. Heber [place]. Charles Barnett has a history of morbid obesity, obstructive sleep apnea on CPAP, arthritis, hypertension and neuromuscular disorder and chronic low back pain on narcotics and status post spinal stimulator. He reports a one-week history of chest pain and cough, but in retrospect he's had fatigue and shortness of breath for several weeks. He presented to [facility] urgent care today for evaluation and he was found to have atrial flutter with rapid ventricular response. EMS was called to urgent care where he was evaluated and given 5 mg of IV Cardizem. Initial lab work in the ER was unremarkable. Initial troponin was negative. Chest x-ray shows mild cardiomegaly and mild interstitial prominence, probably suggestive of heart failure. He's had a persistent, nonproductive cough, shortness of breath and fatigue with exertion, especially when he was mowing his yard this past weekend.  PMHx   Past Medical History:  Diagnosis Date  . Arthritis   . Hypertension   . Neuromuscular disorder [facility])     Past Surgical History:  Procedure Laterality Date  . SPINE SURGERY      FAMHx   Family History  Problem Relation Age of Onset  . Diabetes Mother   . Hyperlipidemia Mother   . Hypertension Mother   . Heart disease Mother   . Diabetes Sister   . Hypertension Sister   . Heart disease Sister     SOCHx    reports that he has never smoked. He has never used smokeless tobacco. He reports that he does not drink alcohol or use drugs.  Outpatient Medications   No current facility-administered medications on file prior to encounter.    Current Outpatient Prescriptions on File Prior to Encounter    Medication Sig Dispense Refill  . losartan-hydrochlorothiazide (HYZAAR) 100-25 MG tablet Take 1 tablet by mouth daily. 90 tablet 1  . oxyCODONE (OXYCONTIN) 40 mg 12 hr tablet Take 20 mg by mouth every 12 (twelve) hours.   0  . oxyCODONE-acetaminophen (PERCOCET) 10-325 MG tablet Take 1 tablet by mouth every 6 (six) hours as needed for pain.     Marland Kitchen tiZANidine (ZANAFLEX) 4 MG tablet Take 4 mg by mouth 3 (three) times daily as needed for muscle spasms.       Inpatient Medications    Scheduled Meds: . [START ON 01/08/2017] aspirin EC  81 mg Oral Daily  . heparin  5,000 Units Subcutaneous Q8H  . oxyCODONE  20 mg Oral Q12H  . sodium chloride flush  3 mL Intravenous Q12H    Continuous Infusions: . diltiazem (CARDIZEM) infusion 5 mg/hr (01/07/17 1241)    PRN Meds: acetaminophen **OR** acetaminophen, oxyCODONE-acetaminophen **AND** oxyCODONE, senna-docusate   ALLERGIES   Allergies  Allergen Reactions  . Ibuprofen Other (See Comments)    "bloody stools"  . Iodine Swelling    Contrast dye  . Iodinated Diagnostic Agents Swelling    ROS   Pertinent items noted in HPI and remainder of comprehensive ROS otherwise negative.  Vitals   Vitals:   01/07/17 1430 01/07/17 1500 01/07/17 1545 01/07/17 1626  BP: 100/61 98/66 (!) 96/57 112/70  Pulse: 80 80 80 79  Resp: '19 20 20 18  '$ Temp:  97.6 F (36.4 C)  TempSrc:    Oral  SpO2: 94% 97% 96% 98%  Weight:    (!) 414 lb 7.4 oz (188 kg)  Height:    6' (1.829 m)   No intake or output data in the 24 hours ending 01/07/17 1721 Filed Weights   01/07/17 1626  Weight: (!) 414 lb 7.4 oz (188 kg)    Physical Exam   General appearance: alert, no distress and moderately obese Neck: no carotid bruit and Thick neck, unable to assess JVP Lungs: diminished breath sounds bilaterally and No clear rales Heart: irregularly irregular rhythm and No murmur Abdomen: soft, non-tender; bowel sounds normal; no masses,  no organomegaly and Morbidly  obese Extremities: extremities normal, atraumatic, no cyanosis or edema Pulses: 2+ and symmetric Skin: Skin color, texture, turgor normal. No rashes or lesions Neurologic: Grossly normal Psych: Pleasant  Labs   Results for orders placed or performed during the hospital encounter of 01/07/17 (from the past 48 hour(s))  Basic metabolic panel     Status: Abnormal   Collection Time: 01/07/17 12:10 PM  Result Value Ref Range   Sodium 137 135 - 145 mmol/L   Potassium 4.1 3.5 - 5.1 mmol/L   Chloride 104 101 - 111 mmol/L   CO2 25 22 - 32 mmol/L   Glucose, Bld 102 (H) 65 - 99 mg/dL   BUN 8 6 - 20 mg/dL   Creatinine, Ser 0.90 0.61 - 1.24 mg/dL   Calcium 10.6 (H) 8.9 - 10.3 mg/dL   GFR calc non Af Amer >60 >60 mL/min   GFR calc Af Amer >60 >60 mL/min    Comment: (NOTE) The eGFR has been calculated using the CKD EPI equation. This calculation has not been validated in all clinical situations. eGFR's persistently <60 mL/min signify possible Chronic Kidney Disease.    Anion gap 8 5 - 15  CBC     Status: None   Collection Time: 01/07/17 12:10 PM  Result Value Ref Range   WBC 10.3 4.0 - 10.5 K/uL   RBC 5.30 4.22 - 5.81 MIL/uL   Hemoglobin 14.7 13.0 - 17.0 g/dL   HCT 46.1 39.0 - 52.0 %   MCV 87.0 78.0 - 100.0 fL   MCH 27.7 26.0 - 34.0 pg   MCHC 31.9 30.0 - 36.0 g/dL   RDW 14.2 11.5 - 15.5 %   Platelets 243 150 - 400 K/uL  Troponin I     Status: None   Collection Time: 01/07/17 12:10 PM  Result Value Ref Range   Troponin I <0.03 <0.03 ng/mL    ECG   Atrial flutter with rapid ventricular response at 105 - Personally Reviewed  Telemetry   Atrial flutter with controlled ventricular response - Personally Reviewed  Radiology   Dg Chest 2 View  Result Date: 01/07/2017 CLINICAL DATA:  Cough and congestion. EXAM: CHEST  2 VIEW COMPARISON:  No prior. FINDINGS: Cardiomegaly with mild pulmonary interstitial prominence. Mild interstitial pneumonitis cannot be excluded. Low lung volumes.  No pleural effusion pneumothorax. Neurostimulator noted projected over the thoracic spine. IMPRESSION: 1. Low lung volumes. 2. Mild cardiomegaly and mild interstitial prominence. Mild interstitial edema and/or pneumonitis cannot be excluded. Electronically Signed   By: [place]   On: 01/07/2017 12:36    Cardiac Studies   None  Impression   1. Active Problems: 2.   Atrial flutter ([place]) 3. Congestive heart failure - suspect acute systolic  Recommendation   1. Mr. Morain presents with several weeks  of upper respiratory symptoms, progressive shortness of breath, fatigue and ultimately found to be in atrial flutter with rapid ventricular response. He's responded to IV diltiazem. His CHADSVASC score is 1-2, depending on whether he is diagnosed with congestive heart failure, which I suspect he may have as a result of uncontrolled ventricular rate. For now I would continue low-dose diltiazem. Data on the use of novel oral anticoagulants is limited in population with his weight. Options include either starting a novel oral anticoagulant and checking a peak anti-Xa level about 2.5 hours after the dose, versus using heparin and warfarin. I would prefer the initial strategy and have found good response to Xarelto 20 mg daily in this patient population. Ultimately we will likely recommend cardioversion, likely after at least 3 weeks of uninterrupted anticoagulation. In addition, he appears to be in congestive heart failure. I suspect this is systolic congestive heart failure secondary to tachycardia of atrial flutter. I recommend IV diuresis and agree with an echocardiogram.  Thanks for the consultation. Cardiology will follow closely with you.  Time Spent Directly with Patient:  40 minutes  Length of Stay:  LOS: 0 days   Pixie Casino, MD, [place]  Attending Cardiologist  Direct Dial: [phone]  Fax: [phone]  Website: [email or web address]  Selim Durden 01/07/2017, 5:21 PM

## 2017-01-07 NOTE — ED Provider Notes (Signed)
[facility]-EMERGENCY DEPT Provider Note   CSN: [id number] Arrival date & time: 01/07/17  1138     History   Chief Complaint Chief Complaint  Patient presents with  . Atrial Flutter    HPI Charles Barnett is a 51 y.o. male.  Pt presents to the ED today from [facility] urgent care.  Pt went there for cp and cough that he's had for about 1 week.  He was found to have a flutter with a rapid rate.  He has never had this in the past.  EMS was called, and he was given 5 mg of cardizem en route.  That did slow down his HR a little, but not much.  Pt is unable to tell that he has an irregular heart beat.  He is not sure when it started.      Past Medical History:  Diagnosis Date  . Arthritis   . Hypertension   . Neuromuscular disorder [facility])     Patient Active Problem List   Diagnosis Date Noted  . Left shoulder pain 10/18/2016  . Right knee pain 10/18/2016  . Apnea, sleep 01/06/2014  . Cannot sleep 10/01/2013  . Body mass index (BMI) of 50-59.9 in adult (HCC) 06/25/2013  . Postoperative back pain 05/27/2013  . BP (high blood pressure) 05/27/2013  . H/O disease 03/22/2013    Past Surgical History:  Procedure Laterality Date  . SPINE SURGERY         Home Medications    Prior to Admission medications   Medication Sig Start Date End Date Taking? Authorizing Provider  losartan-hydrochlorothiazide (HYZAAR) 100-25 MG tablet Take 1 tablet by mouth daily. 05/14/16  Yes Elizabeth Whitney McVey, PA-C  oxyCODONE (OXYCONTIN) 40 mg 12 hr tablet Take 20 mg by mouth every 12 (twelve) hours.  09/17/16  Yes Historical Provider, MD  oxyCODONE-acetaminophen (PERCOCET) 10-325 MG tablet Take 1 tablet by mouth every 6 (six) hours as needed for pain.  10/15/16  Yes Historical Provider, MD  tiZANidine (ZANAFLEX) 4 MG tablet Take 4 mg by mouth 3 (three) times daily as needed for muscle spasms.    Yes Historical Provider, MD    Family History Family History  Problem Relation Age of Onset  . Diabetes  Mother   . Hyperlipidemia Mother   . Hypertension Mother   . Heart disease Mother   . Diabetes Sister   . Hypertension Sister   . Heart disease Sister     Social History Social History  Substance Use Topics  . Smoking status: Never Smoker  . Smokeless tobacco: Never Used  . Alcohol use No     Allergies   Ibuprofen; Iodine; and Iodinated diagnostic agents   Review of Systems Review of Systems  Respiratory: Positive for cough.   Cardiovascular: Positive for chest pain.  All other systems reviewed and are negative.    Physical Exam Updated Vital Signs BP 102/72 (BP Location: Left Wrist)   Pulse 79   Temp 97.8 F (36.6 C) (Oral)   Resp 18   SpO2 95%   Physical Exam  Constitutional: He is oriented to person, place, and time. He appears well-developed and well-nourished.  HENT:  Head: Normocephalic and atraumatic.  Right Ear: External ear normal.  Left Ear: External ear normal.  Nose: Nose normal.  Mouth/Throat: Oropharynx is clear and moist.  Eyes: Conjunctivae and EOM are normal. Pupils are equal, round, and reactive to light.  Neck: Normal range of motion. Neck supple.  Cardiovascular: Normal heart sounds and intact  distal pulses.   Pulmonary/Chest: Effort normal and breath sounds normal.  Abdominal: Soft. Bowel sounds are normal.  Musculoskeletal: Normal range of motion.  Neurological: He is alert and oriented to person, place, and time.  Skin: Skin is warm and dry.  Psychiatric: He has a normal mood and affect. His behavior is normal. Judgment and thought content normal.  Nursing note and vitals reviewed.    ED Treatments / Results  Labs (all labs ordered are listed, but only abnormal results are displayed) Labs Reviewed  BASIC METABOLIC PANEL - Abnormal; Notable for the following:       Result Value   Glucose, Bld 102 (*)    Calcium 10.6 (*)    All other components within normal limits  CBC  TROPONIN I    EKG  EKG  Interpretation  Date/Time:  Tuesday January 07 2017 11:42:14 EDT Ventricular Rate:  105 PR Interval:    QRS Duration: 94 QT Interval:  399 QTC Calculation: 540 R Axis:   -57 Text Interpretation:  Atrial flutter Inferior infarct, acute (LCx) Probable anterior infarct, age indeterminate Lateral leads are also involved Prolonged QT interval Confirmed by Particia Nearing MD, Moe Brier ([id number]) on 01/07/2017 12:14:08 PM       Radiology Dg Chest 2 View  Result Date: 01/07/2017 CLINICAL DATA:  Cough and congestion. EXAM: CHEST  2 VIEW COMPARISON:  No prior. FINDINGS: Cardiomegaly with mild pulmonary interstitial prominence. Mild interstitial pneumonitis cannot be excluded. Low lung volumes. No pleural effusion pneumothorax. Neurostimulator noted projected over the thoracic spine. IMPRESSION: 1. Low lung volumes. 2. Mild cardiomegaly and mild interstitial prominence. Mild interstitial edema and/or pneumonitis cannot be excluded. Electronically Signed   By: Maisie Fus  Register   On: 01/07/2017 12:36    Procedures Procedures (including critical care time)  Medications Ordered in ED Medications  diltiazem (CARDIZEM) 1 mg/mL load via infusion 20 mg (20 mg Intravenous Bolus from Bag 01/07/17 1238)    And  diltiazem (CARDIZEM) 100 mg in dextrose 5% (1 mg/mL) infusion (5 mg/hr Intravenous New Bag/Given 01/07/17 1241)  aspirin chewable tablet 324 mg (324 mg Oral Given 01/07/17 1237)     Initial Impression / Assessment and Plan / ED Course  I have reviewed the triage vital signs and the nursing notes.  Pertinent labs & imaging results that were available during my care of the patient were reviewed by me and considered in my medical decision making (see chart for details).    Pt given cardizem bolus and drip.  This did control his hr.  A flutter is new.  This is new for pt.  Pt d/w IM for unassigned.  Final Clinical Impressions(s) / ED Diagnoses   Final diagnoses:  Atrial flutter with rapid ventricular  response (HCC)  Morbid obesity (HCC)    New Prescriptions New Prescriptions   No medications on file     Jacalyn Lefevre, MD 01/07/17 1410

## 2017-01-07 NOTE — Progress Notes (Signed)
PRIMARY CARE AT [facility] [address] [place] [address] [phone]  Date:  01/07/2017   Name:  Charles Barnett   DOB:  12-Dec-1965   MRN:  [id number]  PCP:  Dois Davenport., MD    History of Present Illness:  Charles Barnett is a 51 y.o. male patient who presents to PCP with  Chief Complaint  Patient presents with  . Cough     Patient has a complaint of a cough.  He initially had nasal congestion, cough, sore throat, and ear pain that lasted for about 2 weeks.  He however has had a lingering cough.  Productive phlegm yellowish and brown.  Patient reports a cough for over 1 month.  He has difficulty with his breathing over the last 2 weeks.  Cough with chest discomfort.  Difficulty with breathing with ambulation, and rasping sound.  He has never had this before.  No fever.  No nasal congestion.  No hx of asthma.  cpap helps with coughing at night.  Sleeps with head adjustment of the bed with elevation due to back pain.  1 pillow.   He reports that he has had no use of nyquil, or pseudoephedrine as part of his regimen for his cough.    Patient Active Problem List   Diagnosis Date Noted  . Left shoulder pain 10/18/2016  . Right knee pain 10/18/2016  . Apnea, sleep 01/06/2014  . Cannot sleep 10/01/2013  . Body mass index (BMI) of 50-59.9 in adult (HCC) 06/25/2013  . Postoperative back pain 05/27/2013  . BP (high blood pressure) 05/27/2013  . H/O disease 03/22/2013    Past Medical History:  Diagnosis Date  . Arthritis   . Hypertension   . Neuromuscular disorder [facility])     Past Surgical History:  Procedure Laterality Date  . SPINE SURGERY      Social History  Substance Use Topics  . Smoking status: Never Smoker  . Smokeless tobacco: Never Used  . Alcohol use No    Family History  Problem Relation Age of Onset  . Diabetes Mother   . Hyperlipidemia Mother   . Hypertension Mother   . Heart disease Mother   . Diabetes Sister   . Hypertension Sister   . Heart  disease Sister     Allergies  Allergen Reactions  . Iodinated Diagnostic Agents Swelling  . Ibuprofen   . Iodine     Contrast dye    Medication list has been reviewed and updated.  Current Outpatient Prescriptions on File Prior to Visit  Medication Sig Dispense Refill  . losartan-hydrochlorothiazide (HYZAAR) 100-25 MG tablet Take 1 tablet by mouth daily. 90 tablet 1  . oxyCODONE (OXYCONTIN) 40 mg 12 hr tablet 20 mg.   0  . oxyCODONE-acetaminophen (PERCOCET) 10-325 MG tablet     . tiZANidine (ZANAFLEX) 4 MG tablet Take 4 mg by mouth 3 (three) times daily.     No current facility-administered medications on file prior to visit.     ROS ROS otherwise unremarkable unless listed above.  Physical Examination: BP 124/84 (BP Location: Right Arm, Patient Position: Sitting, Cuff Size: Large)   Pulse 73   Temp 98.6 F (37 C) (Oral)   Resp 18   Ht 6' (1.829 m)   Wt (!) 424 lb 12.8 oz (192.7 kg)   SpO2 95%   BMI 57.61 kg/m  Ideal Body Weight: Weight in (lb) to have BMI = 25: 183.9  Physical Exam  Constitutional: He is oriented to  person, place, and time. He appears well-developed and well-nourished. No distress.  HENT:  Head: Normocephalic and atraumatic.  Right Ear: Tympanic membrane, external ear and ear canal normal.  Left Ear: Tympanic membrane, external ear and ear canal normal.  Nose: Mucosal edema and rhinorrhea present. Right sinus exhibits no maxillary sinus tenderness and no frontal sinus tenderness. Left sinus exhibits no maxillary sinus tenderness and no frontal sinus tenderness.  Mouth/Throat: No uvula swelling. No oropharyngeal exudate, posterior oropharyngeal edema or posterior oropharyngeal erythema.  Eyes: Conjunctivae, EOM and lids are normal. Pupils are equal, round, and reactive to light. Right eye exhibits normal extraocular motion. Left eye exhibits normal extraocular motion.  Neck: Trachea normal and full passive range of motion without pain. No edema and no  erythema present.  Cardiovascular: Normal rate.  An irregular rhythm present. Exam reveals no gallop.   No murmur heard. Pulses:      Radial pulses are 2+ on the right side, and 2+ on the left side.       Dorsalis pedis pulses are 2+ on the right side, and 2+ on the left side.  Pulmonary/Chest: Effort normal. No respiratory distress. He has no decreased breath sounds. He has no wheezes. He has no rhonchi.  Neurological: He is alert and oriented to person, place, and time.  Skin: Skin is warm and dry. He is not diaphoretic.  Psychiatric: He has a normal mood and affect. His behavior is normal.    EKG: new onset atrial flutter. Results for orders placed or performed in visit on 01/07/17  POCT CBC  Result Value Ref Range   WBC 9.5 4.6 - 10.2 K/uL   Lymph, poc 3.7 (A) 0.6 - 3.4   POC LYMPH PERCENT 38.6 10 - 50 %L   MID (cbc) 0.7 0 - 0.9   POC MID % 7.5 0 - 12 %M   POC Granulocyte 5.1 2 - 6.9   Granulocyte percent 53.9 37 - 80 %G   RBC 5.26 4.69 - 6.13 M/uL   Hemoglobin 14.6 14.1 - 18.1 g/dL   HCT, POC 16.1 09.6 - 53.7 %   MCV 83.5 80 - 97 fL   MCH, POC 27.7 27 - 31.2 pg   MCHC 33.2 31.8 - 35.4 g/dL   RDW, POC 04.5 %   Platelet Count, POC 278 142 - 424 K/uL   MPV 7.3 0 - 99.8 fL    Assessment and Plan: Charles Barnett is a 51 y.o. male who is here today for cc of cough and dyspnea.  New onset of atrial flutter.  Possibility of deterioration. --sent by ems for cardiac monitoring and stabilization.    New onset atrial flutter (HCC)  Atrial flutter, unspecified type (HCC)  Cough - Plan: EKG 12-Lead, POCT CBC  Irregular heart rhythm - Plan: EKG 12-Lead  Dyspnea on exertion - Plan: EKG 12-Lead, POCT CBC  Trena Platt, PA-C [facility] 4/26/20186:39 AM

## 2017-01-07 NOTE — ED Notes (Signed)
Patient is stable and ready to be transport to the floor at this time.  Report was called to 2W RN.  Belongings taken with the patient to the floor.   

## 2017-01-08 ENCOUNTER — Encounter (HOSPITAL_COMMUNITY): Payer: Self-pay | Admitting: General Practice

## 2017-01-08 ENCOUNTER — Observation Stay (HOSPITAL_BASED_OUTPATIENT_CLINIC_OR_DEPARTMENT_OTHER): Payer: 59

## 2017-01-08 DIAGNOSIS — Z79891 Long term (current) use of opiate analgesic: Secondary | ICD-10-CM

## 2017-01-08 DIAGNOSIS — I110 Hypertensive heart disease with heart failure: Secondary | ICD-10-CM | POA: Diagnosis not present

## 2017-01-08 DIAGNOSIS — G4733 Obstructive sleep apnea (adult) (pediatric): Secondary | ICD-10-CM | POA: Diagnosis not present

## 2017-01-08 DIAGNOSIS — R9431 Abnormal electrocardiogram [ECG] [EKG]: Secondary | ICD-10-CM | POA: Diagnosis not present

## 2017-01-08 DIAGNOSIS — Z6841 Body Mass Index (BMI) 40.0 and over, adult: Secondary | ICD-10-CM | POA: Diagnosis not present

## 2017-01-08 DIAGNOSIS — I4892 Unspecified atrial flutter: Secondary | ICD-10-CM | POA: Diagnosis not present

## 2017-01-08 DIAGNOSIS — I517 Cardiomegaly: Secondary | ICD-10-CM

## 2017-01-08 DIAGNOSIS — M1990 Unspecified osteoarthritis, unspecified site: Secondary | ICD-10-CM | POA: Diagnosis not present

## 2017-01-08 DIAGNOSIS — I5031 Acute diastolic (congestive) heart failure: Secondary | ICD-10-CM | POA: Diagnosis not present

## 2017-01-08 DIAGNOSIS — I10 Essential (primary) hypertension: Secondary | ICD-10-CM | POA: Diagnosis not present

## 2017-01-08 DIAGNOSIS — M545 Low back pain: Secondary | ICD-10-CM | POA: Diagnosis not present

## 2017-01-08 DIAGNOSIS — Z9989 Dependence on other enabling machines and devices: Secondary | ICD-10-CM | POA: Diagnosis not present

## 2017-01-08 DIAGNOSIS — G4730 Sleep apnea, unspecified: Secondary | ICD-10-CM

## 2017-01-08 DIAGNOSIS — G8929 Other chronic pain: Secondary | ICD-10-CM

## 2017-01-08 DIAGNOSIS — Z6833 Body mass index (BMI) 33.0-33.9, adult: Secondary | ICD-10-CM | POA: Diagnosis not present

## 2017-01-08 DIAGNOSIS — I509 Heart failure, unspecified: Secondary | ICD-10-CM | POA: Diagnosis not present

## 2017-01-08 LAB — ECHOCARDIOGRAM COMPLETE
HEIGHTINCHES: 72 in
WEIGHTICAEL: 6631.44 [oz_av]

## 2017-01-08 LAB — COMPREHENSIVE METABOLIC PANEL
ALBUMIN: 3.5 g/dL (ref 3.5–5.0)
ALT: 27 U/L (ref 17–63)
ANION GAP: 7 (ref 5–15)
AST: 25 U/L (ref 15–41)
Alkaline Phosphatase: 56 U/L (ref 38–126)
BUN: 9 mg/dL (ref 6–20)
CALCIUM: 10.3 mg/dL (ref 8.9–10.3)
CHLORIDE: 100 mmol/L — AB (ref 101–111)
CO2: 31 mmol/L (ref 22–32)
Creatinine, Ser: 0.95 mg/dL (ref 0.61–1.24)
GFR calc non Af Amer: >60 mL/min (ref >60)
GLUCOSE: 97 mg/dL (ref 65–99)
POTASSIUM: 3.8 mmol/L (ref 3.5–5.1)
SODIUM: 138 mmol/L (ref 135–145)
Total Bilirubin: 0.7 mg/dL (ref 0.3–1.2)
Total Protein: 6.6 g/dL (ref 6.5–8.1)

## 2017-01-08 LAB — CBC
HCT: 42.9 % (ref 39.0–52.0)
Hemoglobin: 13.4 g/dL (ref 13.0–17.0)
MCH: 27.2 pg (ref 26.0–34.0)
MCHC: 31.2 g/dL (ref 30.0–36.0)
MCV: 87.2 fL (ref 78.0–100.0)
PLATELETS: 237 10*3/uL (ref 150–400)
RBC: 4.92 MIL/uL (ref 4.22–5.81)
RDW: 14.2 % (ref 11.5–15.5)
WBC: 9.8 10*3/uL (ref 4.0–10.5)

## 2017-01-08 LAB — TROPONIN I

## 2017-01-08 LAB — PHOSPHORUS: Phosphorus: 3.2 mg/dL (ref 2.5–4.6)

## 2017-01-08 LAB — HIV ANTIBODY (ROUTINE TESTING W REFLEX): HIV SCREEN 4TH GENERATION: NONREACTIVE

## 2017-01-08 MED ORDER — PERFLUTREN LIPID MICROSPHERE
1.0000 mL | INTRAVENOUS | Status: AC | PRN
  Filled 2017-01-08: qty 10

## 2017-01-08 MED ORDER — DILTIAZEM HCL ER COATED BEADS 120 MG PO CP24
120.0000 mg | ORAL_CAPSULE | Freq: Every day | ORAL | Status: DC
  Administered 2017-01-08: 120 mg via ORAL
  Filled 2017-01-08 (×3): qty 1

## 2017-01-08 NOTE — Progress Notes (Signed)
Per [employer] check for Xarelto # 2. S/W  [employer] @ [employer] # [phone]   XARELTO  20 MG DAILY   COVER- YES  CO-PAY- $ 101.48   Q/L ONE PILL PER DAY  PRIOR APPROVAL- NO   PHARMACY : [facility]

## 2017-01-08 NOTE — Progress Notes (Signed)
   Subjective:   No acute events overnight. Patient continues to be in a flutter  A repeat EKG read as "acute STEMI". I reviewed and do not see any ST elevations- the a flutter waves are more prominent. I reviewed the EKG with Dr Rennis Golden and thy agree with the assessment.   We explained that patient will need to be on xarelto, and to take at same time each day, and to not miss any doses, and take it with biggest meal of the day.    Objective:  Vital signs in last 24 hours: Vitals:   01/07/17 2043 01/07/17 2351 01/08/17 0512 01/08/17 0932  BP: 100/69 111/66 103/60 120/80  Pulse: 81 79 74 79  Resp: Temp: 97.9 F (36.6 C) 98.6 F (37 C) 98.3 F (36.8 C)   TempSrc: Oral Oral Oral   SpO2: 95% 96% 96%   Weight:      Height:       General: Vital signs reviewed. Patient in no acute distress , morbidly obese Cardiovascular: rates seem normal, but pt in a flutter Pulmonary/Chest: Clear to auscultation bilaterally, no wheezes, rales, or rhonchi. Abdominal: Soft, non-tender, non-distended, BS +  I&O net -700   Assessment/Plan:  Active Problems:   Atrial flutter (HCC)  New onset atrial flutter: Patient continues to be in atrial flutter as shown in the repeat EKG this morning. IV dilt has been converted to oral diltiazem, and pt has been started on xarelto.  TSH has been normal. HIV negative. Echo shows normal LVEF but RV volume overload. I discussed with cardiology, and they do not think further IV diuresis will be beneficial, and do not recommend sending pt home on oral diuretics. A repeat EKG read as "acute STEMI". I reviewed and do not see any ST elevations- the a flutter waves are more prominent. I reviewed the EKG with Dr Rennis Golden and thy agree with the assessment.  He will benefit from another day in hospital.   - Oral diltiazem 120 mg daily -continued uninterrupted daily xarelto for 3 weeks- follow up with cardiology in 3 weeks for cardioversion if he is in atrial flutter.    Hypercalcemia: Per prior BMETs Ca has been slightly elevated in the past and Phos was slightly low yesterday with normal albumin. However, this morning Ca has normalized and so is the Phos.  Will need to repeat BMETs, and Phos, and if the Ca continues to be elevated, he will need further work up for hypercalcemia and may need a PTH.   -follow up outpatient.   Sleep apnea: He is currently on CPAP and is compliant -may enefit from BiPap and will need to follow up with his current pulmonologist.   HTN: patient has been normotensive off home losartan-hctz as he has been on diltiazem.  -decrease home antihypertensive on discharge  Chronic back pain -continue oxycontin and percoset   DVT PPx - xarelto  Code Status - Full   Dispo: Anticipated discharge in approximately 1 day(s).   Deneise Lever, MD 01/08/2017, 11:45 AM

## 2017-01-08 NOTE — Progress Notes (Signed)
EKG obtained as ordered and it was showing acute MI but unable to tell if it is true or not d/t baseline rhythm of A-flutter. EKG reviewed with Dr. Rennis Golden. No MI. Internal resident paged.   Pt is sitting comfortably in the bed. No acute distress, and denies chest pain and SOB.

## 2017-01-08 NOTE — Progress Notes (Signed)
DAILY PROGRESS NOTE   Patient Name: Charles Barnett Date of Encounter: 01/08/2017  Hospital Problem List   Active Problems:   Atrial flutter [facility])    Chief Complaint   Breathing improved. Coughed up yellow phlegm.  Subjective   Remains in atrial flutter with 4:1 conduction. Started on Xarelto. Anti-Xa level was >2.2 at peak dose, suggesting that this medication will likely be clinically effective given his weight. Echo yesterday shows normal LVEF with moderate RV dilitation with normal systolic function, RAE and likely elevated right heart pressures - c/w morbid obesity and history of OSA. Diursed 700 cc negative overnight.   Objective   Vitals:   01/07/17 2043 01/07/17 2351 01/08/17 0512 01/08/17 0932  BP: 100/69 111/66 103/60 120/80  Pulse: 81 79 74 79  Resp: _0 Temp: 97.9 F (36.6 C) 98.6 F (37 C) 98.3 F (36.8 C)   TempSrc: Oral Oral Oral   SpO2: 95% 96% 96%   Weight:      Height:        Intake/Output Summary (Last 24 hours) at 01/08/17 1054 Last data filed at 01/08/17 1020  Gross per 24 hour  Intake           876.58 ml  Output             1650 ml  Net          -773.42 ml   Filed Weights   01/07/17 1626  Weight: (!) 414 lb 7.4 oz (188 kg)    Physical Exam   General appearance: alert, no distress and morbidly obese Lungs: diminished breath sounds bilaterally Heart: regular rate and rhythm Extremities: extremities normal, atraumatic, no cyanosis or edema Neurologic: Grossly normal  Inpatient Medications    Scheduled Meds: . aspirin EC  81 mg Oral Daily  . oxyCODONE  20 mg Oral Q12H  . rivaroxaban  20 mg Oral Q supper  . sodium chloride flush  3 mL Intravenous Q12H    Continuous Infusions: . diltiazem (CARDIZEM) infusion 5 mg/hr (01/07/17 2150)    PRN Meds: acetaminophen **OR** acetaminophen, oxyCODONE-acetaminophen **AND** oxyCODONE, perflutren lipid microspheres (DEFINITY) IV suspension, senna-docusate   Labs   Results for  orders placed or performed during the hospital encounter of 01/07/17 (from the past 48 hour(s))  Basic metabolic panel     Status: Abnormal   Collection Time: 01/07/17 12:10 PM  Result Value Ref Range   Sodium 137 135 - 145 mmol/L   Potassium 4.1 3.5 - 5.1 mmol/L   Chloride 104 101 - 111 mmol/L   CO2 25 22 - 32 mmol/L   Glucose, Bld 102 (H) 65 - 99 mg/dL   BUN 8 6 - 20 mg/dL   Creatinine, Ser 0.90 0.61 - 1.24 mg/dL   Calcium 10.6 (H) 8.9 - 10.3 mg/dL   GFR calc non Af Amer >60 >60 mL/min   GFR calc Af Amer >60 >60 mL/min    Comment: (NOTE) The eGFR has been calculated using the CKD EPI equation. This calculation has not been validated in all clinical situations. eGFR's persistently <60 mL/min signify possible Chronic Kidney Disease.    Anion gap 8 5 - 15  CBC     Status: None   Collection Time: 01/07/17 12:10 PM  Result Value Ref Range   WBC 10.3 4.0 - 10.5 K/uL   RBC 5.30 4.22 - 5.81 MIL/uL   Hemoglobin 14.7 13.0 - 17.0 g/dL   HCT 46.1 39.0 - 52.0 %  MCV 87.0 78.0 - 100.0 fL   MCH 27.7 26.0 - 34.0 pg   MCHC 31.9 30.0 - 36.0 g/dL   RDW 14.2 11.5 - 15.5 %   Platelets 243 150 - 400 K/uL  Troponin I     Status: None   Collection Time: 01/07/17 12:10 PM  Result Value Ref Range   Troponin I <0.03 <0.03 ng/mL  TSH     Status: None   Collection Time: 01/07/17  4:23 PM  Result Value Ref Range   TSH 2.350 0.350 - 4.500 uIU/mL    Comment: Performed by a 3rd Generation assay with a functional sensitivity of <=0.01 uIU/mL.  HIV antibody (Routine Testing)     Status: None   Collection Time: 01/07/17  7:39 PM  Result Value Ref Range   HIV Screen 4th Generation wRfx Non Reactive Non Reactive    Comment: (NOTE) Performed At: [facility] [address] [place], [place] [id number] Lindon Romp MD [id number]   Troponin I     Status: None   Collection Time: 01/07/17  7:39 PM  Result Value Ref Range   Troponin I <0.03 <0.03 ng/mL  Hepatic function panel     Status:  None   Collection Time: 01/07/17  7:39 PM  Result Value Ref Range   Total Protein 7.4 6.5 - 8.1 g/dL   Albumin 3.7 3.5 - 5.0 g/dL   AST 29 15 - 41 U/L   ALT 30 17 - 63 U/L   Alkaline Phosphatase 60 38 - 126 U/L   Total Bilirubin 1.0 0.3 - 1.2 mg/dL   Bilirubin, Direct 0.2 0.1 - 0.5 mg/dL   Indirect Bilirubin 0.8 0.3 - 0.9 mg/dL  Magnesium     Status: None   Collection Time: 01/07/17  7:39 PM  Result Value Ref Range   Magnesium 1.8 1.7 - 2.4 mg/dL  Phosphorus     Status: Abnormal   Collection Time: 01/07/17  7:39 PM  Result Value Ref Range   Phosphorus 2.1 (L) 2.5 - 4.6 mg/dL  Heparin level (unfractionated)     Status: Abnormal   Collection Time: 01/07/17 10:27 PM  Result Value Ref Range   Heparin Unfractionated >2.20 (H) 0.30 - 0.70 IU/mL    Comment: RESULTS CONFIRMED BY MANUAL DILUTION        IF HEPARIN RESULTS ARE BELOW EXPECTED VALUES, AND PATIENT DOSAGE HAS BEEN CONFIRMED, SUGGEST FOLLOW UP TESTING OF ANTITHROMBIN III LEVELS.   Troponin I     Status: None   Collection Time: 01/08/17  4:16 AM  Result Value Ref Range   Troponin I <0.03 <0.03 ng/mL  CBC     Status: None   Collection Time: 01/08/17  4:16 AM  Result Value Ref Range   WBC 9.8 4.0 - 10.5 K/uL   RBC 4.92 4.22 - 5.81 MIL/uL   Hemoglobin 13.4 13.0 - 17.0 g/dL   HCT 42.9 39.0 - 52.0 %   MCV 87.2 78.0 - 100.0 fL   MCH 27.2 26.0 - 34.0 pg   MCHC 31.2 30.0 - 36.0 g/dL   RDW 14.2 11.5 - 15.5 %   Platelets 237 150 - 400 K/uL  Comprehensive metabolic panel     Status: Abnormal   Collection Time: 01/08/17  4:16 AM  Result Value Ref Range   Sodium 138 135 - 145 mmol/L   Potassium 3.8 3.5 - 5.1 mmol/L   Chloride 100 (L) 101 - 111 mmol/L   CO2 31 22 - 32 mmol/L  Glucose, Bld 97 65 - 99 mg/dL   BUN 9 6 - 20 mg/dL   Creatinine, Ser 0.95 0.61 - 1.24 mg/dL   Calcium 10.3 8.9 - 10.3 mg/dL   Total Protein 6.6 6.5 - 8.1 g/dL   Albumin 3.5 3.5 - 5.0 g/dL   AST 25 15 - 41 U/L   ALT 27 17 - 63 U/L   Alkaline  Phosphatase 56 38 - 126 U/L   Total Bilirubin 0.7 0.3 - 1.2 mg/dL   GFR calc non Af Amer >60 >60 mL/min   GFR calc Af Amer >60 >60 mL/min    Comment: (NOTE) The eGFR has been calculated using the CKD EPI equation. This calculation has not been validated in all clinical situations. eGFR's persistently <60 mL/min signify possible Chronic Kidney Disease.    Anion gap 7 5 - 15  Phosphorus     Status: None   Collection Time: 01/08/17  4:16 AM  Result Value Ref Range   Phosphorus 3.2 2.5 - 4.6 mg/dL    ECG   Atrial flutter - Personally Reviewed  Telemetry   Atrial flutter with 4:1 conduction at 75 - Personally Reviewed  Radiology    Dg Chest 2 View  Result Date: 01/07/2017 CLINICAL DATA:  Cough and congestion. EXAM: CHEST  2 VIEW COMPARISON:  No prior. FINDINGS: Cardiomegaly with mild pulmonary interstitial prominence. Mild interstitial pneumonitis cannot be excluded. Low lung volumes. No pleural effusion pneumothorax. Neurostimulator noted projected over the thoracic spine. IMPRESSION: 1. Low lung volumes. 2. Mild cardiomegaly and mild interstitial prominence. Mild interstitial edema and/or pneumonitis cannot be excluded. Electronically Signed   By: Marcello Moores  Register   On: 01/07/2017 12:36    Cardiac Studies   LV EF: 55% -   60%  ------------------------------------------------------------------- Indications:      Abnormal EKG 794.31.  ------------------------------------------------------------------- History:   PMH:   Atrial fibrillation.  Risk factors:  morbid obesity. Hypertension.  ------------------------------------------------------------------- Study Conclusions  - Left ventricle: The cavity size was normal. Wall thickness was   increased in a pattern of mild LVH. Systolic function was normal.   The estimated ejection fraction was in the range of 55% to 60%.   Indeterminant diastolic function (atrial fibrillation). Although   no diagnostic regional wall motion  abnormality was identified,   this possibility cannot be completely excluded on the basis of   this study. - Ventricular septum: Mildly D-shaped interventricular septum,   suggestive of RV pressure/volume overload. - Aortic valve: There was no stenosis. - Mitral valve: There was no significant regurgitation. - Right ventricle: The cavity size was mildly to moderately   dilated. Systolic function was normal. - Right atrium: The atrium was mildly dilated. - Atrial septum: Atrial septal aneurysm noted. - Pulmonary arteries: No complete TR doppler jet so unable to   estimate PA systolic pressure. - Systemic veins: IVC was not visualized.  Impressions:  - Technically difficult study with poor acoustic windows. Normal LV   size with mild LV hypertrophy. EF 55-60%. Mildly D-shaped   interventricular septum is suggestive of a degree of RV   pressure/volume overload. Mild to moderate RV dilation with   normal systolic function. Unable to estimate PA systolic pressure   on this study.  Assessment   1. Active Problems: 2.   Atrial flutter ([place]) 3. Acute diastolic congestive heart failure  Plan   1. Charles Barnett feels better after diuresis. Fortunately, LVEF is preserved and rate is now controlled but he remains in flutter. Will transition  IV cardizem to po cardizem this am. 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 He is on CPAP (and compliant), buy may benefit from BIPAP - he sees Dr. Freda Munro? Plan from a cardiac standpoint is 3 weeks of uninterrupted anticoagulation with Xarelto and attempt for cardioversion after that time if he remains in a-fib.  Time Spent Directly with Patient:  15 minutes  Length of Stay:  LOS: 0 days   Pixie Casino, MD, [place]  Attending Cardiologist  Direct Dial: [phone]  Fax: [phone]  Website:  [email or web address] 01/08/2017, 10:54 AM

## 2017-01-08 NOTE — Care Management Note (Signed)
Case Management Note Donn Pierini RN, BSN Unit 2W-Case Manager [phone]  Patient Details  Name: Charles Barnett MRN: [id number] Date of Birth: [birth year]  Subjective/Objective:    Pt presented with afib                Action/Plan: PTA pt lived at home with wife- has been started on Xarelto- per insurance check copay $101.48- spoke with pt and wife at bedside coverage info shared- 30 day free card given to pt to use on d/c- pt [facility]- pt also given copay assist card to use later. [facility] has seen pt at bedside also regarding PCP needs- pt to see Norberto Sorenson at [place].   Expected Discharge Date:    01/09/17              Expected Discharge Plan:  Home/Self Care  In-House Referral:     Discharge planning Services  CM Consult, Medication Assistance  Post Acute Care Choice:  NA Choice offered to:  NA  DME Arranged:    DME Agency:     HH Arranged:    HH Agency:     Status of Service:  Completed, signed off  If discussed at [employer] of Stay Meetings, dates discussed:    Additional Comments:  Darrold Span, RN 01/08/2017, 2:43 PM

## 2017-01-08 NOTE — Progress Notes (Signed)
   01/08/17 1345  Clinical Encounter Type  Visited With Patient and family together  Visit Type Follow-up  Spiritual Encounters  Spiritual Needs Brochure  Stress Factors  Patient Stress Factors None identified  Family Stress Factors None identified  Completed AD with Pt and another for spouse. Original and copies provided. Copy for Pt in chart.

## 2017-01-08 NOTE — Consult Note (Signed)
   [facility] CM Inpatient Consult   01/08/2017  Charles Barnett Dec 30, 1965 [id number]   Went to bedside to speak with Charles Barnett and wife at bedside on behalf of [facility] Care Management/Link to Wellness program for [facility] employees/dependents with [facility] insurance. His wife is a Runner, broadcasting/film/video. Discussed Link to [employer]. Denies any current needs. Confirmed that he does go to [facility] outpatient pharmacy. Made Charles Barnett aware that he will receive post hospital discharge call. He is agreeable to this. Confirmed best number to call as [phone].   Discussed Primary Care Provider. Both wife and patient endorse that patient used to go to Dr. Hal Hope. However, Dr. Hal Hope has since moved to a different practice. Therefore, he needs a new Primary Care Provider. States that he has been going to [facility] and would like to continue there since they have all of his records.   Writer contacted [facility] on Charles Barnett behalf. He will now follow up with Dr. Norberto Sorenson for Primary Care. Was informed that his [employer] company will need to be called to change his Primary Care to Dr. Clelia Croft as well. Made both patient and wife aware of this. They are agreeable to making post hospital discharge appointment and make telephone call to [facility] to change PCP. Both Charles Barnett and wife expressed appreciation of assistance.  Provided Link to [employer], contact information, and 24-hr nurse line magnet.    Raiford Noble, MSN-Ed, RN,BSN [facility] Liaison [phone]

## 2017-01-08 NOTE — Progress Notes (Signed)
   01/08/17 1035  Clinical Encounter Type  Visited With Patient and family together  Visit Type Other (Comment) ([place] consult)  Spiritual Encounters  Spiritual Needs Emotional  Stress Factors  Patient Stress Factors None identified  Family Stress Factors None identified  Introduction to Pt and family. Provided and explained AD procedures. Pt will work on with family. Follow up later.

## 2017-01-09 DIAGNOSIS — G8929 Other chronic pain: Secondary | ICD-10-CM | POA: Diagnosis not present

## 2017-01-09 DIAGNOSIS — M1990 Unspecified osteoarthritis, unspecified site: Secondary | ICD-10-CM | POA: Diagnosis not present

## 2017-01-09 DIAGNOSIS — G4730 Sleep apnea, unspecified: Secondary | ICD-10-CM | POA: Diagnosis not present

## 2017-01-09 DIAGNOSIS — Z6833 Body mass index (BMI) 33.0-33.9, adult: Secondary | ICD-10-CM | POA: Diagnosis not present

## 2017-01-09 DIAGNOSIS — I5031 Acute diastolic (congestive) heart failure: Secondary | ICD-10-CM | POA: Diagnosis not present

## 2017-01-09 DIAGNOSIS — I4892 Unspecified atrial flutter: Secondary | ICD-10-CM | POA: Diagnosis not present

## 2017-01-09 DIAGNOSIS — I517 Cardiomegaly: Secondary | ICD-10-CM | POA: Diagnosis not present

## 2017-01-09 DIAGNOSIS — Z888 Allergy status to other drugs, medicaments and biological substances status: Secondary | ICD-10-CM

## 2017-01-09 DIAGNOSIS — Z9989 Dependence on other enabling machines and devices: Secondary | ICD-10-CM | POA: Diagnosis not present

## 2017-01-09 DIAGNOSIS — Z91041 Radiographic dye allergy status: Secondary | ICD-10-CM

## 2017-01-09 DIAGNOSIS — Z79891 Long term (current) use of opiate analgesic: Secondary | ICD-10-CM | POA: Diagnosis not present

## 2017-01-09 DIAGNOSIS — Z886 Allergy status to analgesic agent status: Secondary | ICD-10-CM

## 2017-01-09 DIAGNOSIS — I110 Hypertensive heart disease with heart failure: Secondary | ICD-10-CM | POA: Diagnosis not present

## 2017-01-09 DIAGNOSIS — M545 Low back pain: Secondary | ICD-10-CM | POA: Diagnosis not present

## 2017-01-09 DIAGNOSIS — G4733 Obstructive sleep apnea (adult) (pediatric): Secondary | ICD-10-CM | POA: Diagnosis not present

## 2017-01-09 DIAGNOSIS — I509 Heart failure, unspecified: Secondary | ICD-10-CM | POA: Diagnosis not present

## 2017-01-09 DIAGNOSIS — I10 Essential (primary) hypertension: Secondary | ICD-10-CM | POA: Diagnosis not present

## 2017-01-09 DIAGNOSIS — Z6841 Body Mass Index (BMI) 40.0 and over, adult: Secondary | ICD-10-CM | POA: Diagnosis not present

## 2017-01-09 LAB — BASIC METABOLIC PANEL
ANION GAP: 6 (ref 5–15)
BUN: 8 mg/dL (ref 6–20)
CALCIUM: 10.5 mg/dL — AB (ref 8.9–10.3)
CHLORIDE: 102 mmol/L (ref 101–111)
CO2: 31 mmol/L (ref 22–32)
Creatinine, Ser: 1.06 mg/dL (ref 0.61–1.24)
GFR calc Af Amer: >60 mL/min (ref >60)
GFR calc non Af Amer: >60 mL/min (ref >60)
GLUCOSE: 107 mg/dL — AB (ref 65–99)
Potassium: 4.3 mmol/L (ref 3.5–5.1)
Sodium: 139 mmol/L (ref 135–145)

## 2017-01-09 LAB — GLUCOSE, CAPILLARY: Glucose-Capillary: 118 mg/dL — ABNORMAL HIGH (ref 65–99)

## 2017-01-09 MED ORDER — RIVAROXABAN 20 MG PO TABS: 20.0000 mg | ORAL_TABLET | Freq: Every day | ORAL | 0 refills | Status: DC

## 2017-01-09 MED ORDER — DILTIAZEM HCL ER COATED BEADS 240 MG PO CP24
240.0000 mg | ORAL_CAPSULE | Freq: Every day | ORAL | Status: DC
  Administered 2017-01-09: 240 mg via ORAL
  Filled 2017-01-09: qty 1

## 2017-01-09 MED ORDER — DILTIAZEM HCL ER COATED BEADS 240 MG PO CP24: 240.0000 mg | ORAL_CAPSULE | Freq: Every day | ORAL | 0 refills | Status: DC

## 2017-01-09 MED ORDER — DILTIAZEM HCL 60 MG PO TABS: 60.0000 mg | ORAL_TABLET | Freq: Four times a day (QID) | ORAL | Status: DC

## 2017-01-09 MED FILL — XARELTO 20 MG TABLET: 20 | 30 days supply | Qty: 30 | Fill #0

## 2017-01-09 MED FILL — CARTIA XT 240 MG CAPSULE SA: 240 | 30 days supply | Qty: 30 | Fill #0

## 2017-01-09 NOTE — Progress Notes (Signed)
DAILY PROGRESS NOTE   Patient Name: Charles Barnett Date of Encounter: 01/09/2017  Hospital Problem List   Active Problems:   Atrial flutter [facility])    Chief Complaint   No complaints overnight  Subjective   Remains in atrial flutter, rate controlled. Labs stable. BP at goal. He is net even with fluids. Breathing has improved. Now on Xarelto and short-acting cardizem 60 mg q6 hrs.  Objective   Vitals:   01/08/17 0932 01/08/17 1315 01/08/17 2018 01/09/17 0500  BP: 120/80 119/79 125/80 110/65  Pulse: 79 81 68 77  Resp:  _0 Temp:  98.6 F (37 C) 98.8 F (37.1 C) 97.4 F (36.3 C)  TempSrc:  Tympanic Oral Oral  SpO2:  98% 94% 99%  Weight:      Height:        Intake/Output Summary (Last 24 hours) at 01/09/17 1004 Last data filed at 01/08/17 2059  Gross per 24 hour  Intake          1049.92 ml  Output             1200 ml  Net          -150.08 ml   Filed Weights   01/07/17 1626  Weight: (!) 414 lb 7.4 oz (188 kg)    Physical Exam   General appearance: alert, no distress and morbidly obese Lungs: diminished breath sounds bilaterally Heart: regular rate and rhythm Extremities: extremities normal, atraumatic, no cyanosis or edema Neurologic: Grossly normal  Inpatient Medications    Scheduled Meds: . aspirin EC  81 mg Oral Daily  . diltiazem  60 mg Oral Q6H  . oxyCODONE  20 mg Oral Q12H  . rivaroxaban  20 mg Oral Q supper  . sodium chloride flush  3 mL Intravenous Q12H    Continuous Infusions:   PRN Meds: acetaminophen **OR** acetaminophen, oxyCODONE-acetaminophen **AND** oxyCODONE, senna-docusate   Labs   Results for orders placed or performed during the hospital encounter of 01/07/17 (from the past 48 hour(s))  Basic metabolic panel     Status: Abnormal   Collection Time: 01/07/17 12:10 PM  Result Value Ref Range   Sodium 137 135 - 145 mmol/L   Potassium 4.1 3.5 - 5.1 mmol/L   Chloride 104 101 - 111 mmol/L   CO2 25 22 - 32 mmol/L   Glucose, Bld 102 (H) 65 - 99 mg/dL   BUN 8 6 - 20 mg/dL   Creatinine, Ser 0.90 0.61 - 1.24 mg/dL   Calcium 10.6 (H) 8.9 - 10.3 mg/dL   GFR calc non Af Amer >60 >60 mL/min   GFR calc Af Amer >60 >60 mL/min    Comment: (NOTE) The eGFR has been calculated using the CKD EPI equation. This calculation has not been validated in all clinical situations. eGFR's persistently <60 mL/min signify possible Chronic Kidney Disease.    Anion gap 8 5 - 15  CBC     Status: None   Collection Time: 01/07/17 12:10 PM  Result Value Ref Range   WBC 10.3 4.0 - 10.5 K/uL   RBC 5.30 4.22 - 5.81 MIL/uL   Hemoglobin 14.7 13.0 - 17.0 g/dL   HCT 46.1 39.0 - 52.0 %   MCV 87.0 78.0 - 100.0 fL   MCH 27.7 26.0 - 34.0 pg   MCHC 31.9 30.0 - 36.0 g/dL   RDW 14.2 11.5 - 15.5 %   Platelets 243 150 - 400 K/uL  Troponin I  Status: None   Collection Time: 01/07/17 12:10 PM  Result Value Ref Range   Troponin I <0.03 <0.03 ng/mL  TSH     Status: None   Collection Time: 01/07/17  4:23 PM  Result Value Ref Range   TSH 2.350 0.350 - 4.500 uIU/mL    Comment: Performed by a 3rd Generation assay with a functional sensitivity of <=0.01 uIU/mL.  HIV antibody (Routine Testing)     Status: None   Collection Time: 01/07/17  7:39 PM  Result Value Ref Range   HIV Screen 4th Generation wRfx Non Reactive Non Reactive    Comment: (NOTE) Performed At: [facility] [address] [place], [place] [id number] Lindon Romp MD [id number]   Troponin I     Status: None   Collection Time: 01/07/17  7:39 PM  Result Value Ref Range   Troponin I <0.03 <0.03 ng/mL  Hepatic function panel     Status: None   Collection Time: 01/07/17  7:39 PM  Result Value Ref Range   Total Protein 7.4 6.5 - 8.1 g/dL   Albumin 3.7 3.5 - 5.0 g/dL   AST 29 15 - 41 U/L   ALT 30 17 - 63 U/L   Alkaline Phosphatase 60 38 - 126 U/L   Total Bilirubin 1.0 0.3 - 1.2 mg/dL   Bilirubin, Direct 0.2 0.1 - 0.5 mg/dL   Indirect Bilirubin 0.8 0.3 -  0.9 mg/dL  Magnesium     Status: None   Collection Time: 01/07/17  7:39 PM  Result Value Ref Range   Magnesium 1.8 1.7 - 2.4 mg/dL  Phosphorus     Status: Abnormal   Collection Time: 01/07/17  7:39 PM  Result Value Ref Range   Phosphorus 2.1 (L) 2.5 - 4.6 mg/dL  Heparin level (unfractionated)     Status: Abnormal   Collection Time: 01/07/17 10:27 PM  Result Value Ref Range   Heparin Unfractionated >2.20 (H) 0.30 - 0.70 IU/mL    Comment: RESULTS CONFIRMED BY MANUAL DILUTION        IF HEPARIN RESULTS ARE BELOW EXPECTED VALUES, AND PATIENT DOSAGE HAS BEEN CONFIRMED, SUGGEST FOLLOW UP TESTING OF ANTITHROMBIN III LEVELS.   Troponin I     Status: None   Collection Time: 01/08/17  4:16 AM  Result Value Ref Range   Troponin I <0.03 <0.03 ng/mL  CBC     Status: None   Collection Time: 01/08/17  4:16 AM  Result Value Ref Range   WBC 9.8 4.0 - 10.5 K/uL   RBC 4.92 4.22 - 5.81 MIL/uL   Hemoglobin 13.4 13.0 - 17.0 g/dL   HCT 42.9 39.0 - 52.0 %   MCV 87.2 78.0 - 100.0 fL   MCH 27.2 26.0 - 34.0 pg   MCHC 31.2 30.0 - 36.0 g/dL   RDW 14.2 11.5 - 15.5 %   Platelets 237 150 - 400 K/uL  Comprehensive metabolic panel     Status: Abnormal   Collection Time: 01/08/17  4:16 AM  Result Value Ref Range   Sodium 138 135 - 145 mmol/L   Potassium 3.8 3.5 - 5.1 mmol/L   Chloride 100 (L) 101 - 111 mmol/L   CO2 31 22 - 32 mmol/L   Glucose, Bld 97 65 - 99 mg/dL   BUN 9 6 - 20 mg/dL   Creatinine, Ser 0.95 0.61 - 1.24 mg/dL   Calcium 10.3 8.9 - 10.3 mg/dL   Total Protein 6.6 6.5 - 8.1 g/dL   Albumin 3.5  3.5 - 5.0 g/dL   AST 25 15 - 41 U/L   ALT 27 17 - 63 U/L   Alkaline Phosphatase 56 38 - 126 U/L   Total Bilirubin 0.7 0.3 - 1.2 mg/dL   GFR calc non Af Amer >60 >60 mL/min   GFR calc Af Amer >60 >60 mL/min    Comment: (NOTE) The eGFR has been calculated using the CKD EPI equation. This calculation has not been validated in all clinical situations. eGFR's persistently <60 mL/min signify  possible Chronic Kidney Disease.    Anion gap 7 5 - 15  Phosphorus     Status: None   Collection Time: 01/08/17  4:16 AM  Result Value Ref Range   Phosphorus 3.2 2.5 - 4.6 mg/dL  Basic metabolic panel     Status: Abnormal   Collection Time: 01/09/17  3:36 AM  Result Value Ref Range   Sodium 139 135 - 145 mmol/L   Potassium 4.3 3.5 - 5.1 mmol/L   Chloride 102 101 - 111 mmol/L   CO2 31 22 - 32 mmol/L   Glucose, Bld 107 (H) 65 - 99 mg/dL   BUN 8 6 - 20 mg/dL   Creatinine, Ser 1.06 0.61 - 1.24 mg/dL   Calcium 10.5 (H) 8.9 - 10.3 mg/dL   GFR calc non Af Amer >60 >60 mL/min   GFR calc Af Amer >60 >60 mL/min    Comment: (NOTE) The eGFR has been calculated using the CKD EPI equation. This calculation has not been validated in all clinical situations. eGFR's persistently <60 mL/min signify possible Chronic Kidney Disease.    Anion gap 6 5 - 15  Glucose, capillary     Status: Abnormal   Collection Time: 01/09/17  6:25 AM  Result Value Ref Range   Glucose-Capillary 118 (H) 65 - 99 mg/dL    ECG   None today - Personally Reviewed  Telemetry   Atrial flutter with 4:1 conduction at 75 - Personally Reviewed  Radiology    Dg Chest 2 View  Result Date: 01/07/2017 CLINICAL DATA:  Cough and congestion. EXAM: CHEST  2 VIEW COMPARISON:  No prior. FINDINGS: Cardiomegaly with mild pulmonary interstitial prominence. Mild interstitial pneumonitis cannot be excluded. Low lung volumes. No pleural effusion pneumothorax. Neurostimulator noted projected over the thoracic spine. IMPRESSION: 1. Low lung volumes. 2. Mild cardiomegaly and mild interstitial prominence. Mild interstitial edema and/or pneumonitis cannot be excluded. Electronically Signed   By: Marcello Moores  Register   On: 01/07/2017 12:36    Cardiac Studies   LV EF: 55% -   60%  ------------------------------------------------------------------- Indications:      Abnormal EKG  794.31.  ------------------------------------------------------------------- History:   PMH:   Atrial fibrillation.  Risk factors:  morbid obesity. Hypertension.  ------------------------------------------------------------------- Study Conclusions  - Left ventricle: The cavity size was normal. Wall thickness was   increased in a pattern of mild LVH. Systolic function was normal.   The estimated ejection fraction was in the range of 55% to 60%.   Indeterminant diastolic function (atrial fibrillation). Although   no diagnostic regional wall motion abnormality was identified,   this possibility cannot be completely excluded on the basis of   this study. - Ventricular septum: Mildly D-shaped interventricular septum,   suggestive of RV pressure/volume overload. - Aortic valve: There was no stenosis. - Mitral valve: There was no significant regurgitation. - Right ventricle: The cavity size was mildly to moderately   dilated. Systolic function was normal. - Right atrium: The atrium was mildly  dilated. - Atrial septum: Atrial septal aneurysm noted. - Pulmonary arteries: No complete TR doppler jet so unable to   estimate PA systolic pressure. - Systemic veins: IVC was not visualized.  Impressions:  - Technically difficult study with poor acoustic windows. Normal LV   size with mild LV hypertrophy. EF 55-60%. Mildly D-shaped   interventricular septum is suggestive of a degree of RV   pressure/volume overload. Mild to moderate RV dilation with   normal systolic function. Unable to estimate PA systolic pressure   on this study.  Assessment   Active Problems:   Atrial flutter ([place]) 1. Acute diastolic congestive heart failure  Plan   1. Stable atrial flutter on Xarelto. Convert short-acting diltiazem to cardizem LA today. Will likely not need a diuretic going home. Plan for at least 3 weeks of anticoagulation and then will try to arrange for outpatient DCCV if he remains in  flutter. Shannon for d/c home today from my standpoint.  Time Spent Directly with Patient:  15 minutes  Length of Stay:  LOS: 0 days   Pixie Casino, MD, [place]  Attending Cardiologist  Direct Dial: [phone]  Fax: [phone]  Website:  [email or web address] 01/09/2017, 10:04 AM

## 2017-01-09 NOTE — Discharge Summary (Signed)
Name: Charles Barnett MRN: [id number] DOB: [birth year] 51 y.o. PCP: Sherren Mocha, MD  Date of Admission: 01/07/2017 11:38 AM Date of Discharge: 01/09/2017 Attending Physician: Gust Rung, DO  Discharge Diagnosis: Active Problems:   Body mass index (BMI) of 50-59.9 in adult (HCC)   BP (high blood pressure)   Apnea, sleep   Atrial flutter (HCC)   Hypercalcemia  Discharge Medications: Allergies as of 01/09/2017      Reactions   Ibuprofen Other (See Comments)   "bloody stools"   Iodine Swelling   Contrast dye   Iodinated Diagnostic Agents Swelling      Medication List    STOP taking these medications   losartan-hydrochlorothiazide 100-25 MG tablet Commonly known as:  HYZAAR     TAKE these medications   diltiazem 240 MG 24 hr capsule Commonly known as:  CARDIZEM CD Take 1 capsule (240 mg total) by mouth daily.   oxyCODONE 40 mg 12 hr tablet Commonly known as:  OXYCONTIN Take 20 mg by mouth every 12 (twelve) hours.   oxyCODONE-acetaminophen 10-325 MG tablet Commonly known as:  PERCOCET Take 1 tablet by mouth every 6 (six) hours as needed for pain.   rivaroxaban 20 MG Tabs tablet Commonly known as:  XARELTO Take 1 tablet (20 mg total) by mouth daily with supper.   tiZANidine 4 MG tablet Commonly known as:  ZANAFLEX Take 4 mg by mouth 3 (three) times daily as needed for muscle spasms.      Disposition and follow-up:   Mr.Charles Barnett was discharged from [facility] in Good condition.  At the hospital follow up visit please address:  1.  A-flutter: ensure f/u with Cardiology, compliance with diltiazem and good rate control, compliance with Xarelto. HTN: check BP after diltiazem start and losartan-HCTZ discontinued. Add back meds as needed. OSA: Consider switching pt to nightly BiPAP from CPAP in obese patient with elevated bicarb who likely has obesity hypoventilation syndrome Hypercalcemia: pt with mildly elevated Ca, normal albumin. PTH  was pending at DC, may need further w/u.  2.  Pending labs/ test needing follow-up: PTH  Follow-up Appointments: Follow-up Information    SHAW,EVA, MD. Schedule an appointment as soon as possible for a visit in 1 week(s).   Specialty:  Family Medicine Why:  For hospital follow up. Contact information: [address] [place] [address] [phone]           Hospital Course by problem list: Active Problems:   Body mass index (BMI) of 50-59.9 in adult (HCC)   BP (high blood pressure)   Apnea, sleep   Atrial flutter (HCC)   Hypercalcemia   1. Atrial flutter: Patient presented with 1 week history of shortness of breath and chest pressure in the setting of upper respiratory infection symptoms. Patient was found to be in atrial flutter with rapid rates in the 100s to 130s. Patient was started on diltiazem drip with good control initially. He was transitioned to extended release diltiazem which was titrated to 240 mg. Patient improved symptomatically and his rates were controlled at the time of discharge. Patient was also started on Xarelto 20 mg dose. Given his obesity, peak 2.5 hour anti-10a levels were checked which were found to be consistent with therapeutic range. Cardiology recommended the patient continue anti-coagulation with Xarelto for 3 weeks with a plan to follow-up for potential cardioversion. Workup for underlying causes of arrhythmia were unrevealing. Patient had a normal TSH, no signs of ischemic disease, relatively unremarkable  echocardiogram. Hypercalcemia was the only anomaly noted. Patient's chest x-ray, echocardiogram and clinical exam were significant for mild amount of hypervolemia. Patient was given a short dose of IV Lasix for diuresis and had 700 mL output. Cardiology recommended no further diuresis and felt the patient would not likely benefit from outpatient Lasix therapy. Continue to follow fluid status has follow-up visit.  2. Hypertension: Patient is  procedures treated with losartan HCTZ for hypertension at home. Since starting him on diltiazem patient has remained normotensive without his home medication. His home medication was held discharge and should follow-up with his PCP for further titration of blood pressure medication as needed.  3. Hypercalcemia: Patient was found to have hypercalcemia to 10.5 with normal albumin. Patient no other symptoms of hypercalcemia including trismus or Trousseau sign. Patient's hypercalcemia may be related to his atrial flutter has no other cause has been found. PTH was pending at the time of discharge, recommend follow-up with his PCP for further workup as needed.  4. OSA/OHS: Patient is a history of obstructive sleep apnea and uses CPAP nightly. Patient may benefit from BiPAP given the patient's significant obesity and elevated bicarbonate suggesting restrictive functional lung disease.   Discharge Vitals:   BP 110/65 (BP Location: Left Arm)   Pulse 77   Temp 97.4 F (36.3 C) (Oral)   Resp 18   Ht 6' (1.829 m)   Wt (!) 414 lb 7.4 oz (188 kg)   SpO2 99%   BMI 56.21 kg/m   Pertinent Labs, Studies, and Procedures: As above.  Procedures Performed:  Dg Chest 2 View Result Date: 01/07/2017 IMPRESSION: 1. Low lung volumes. 2. Mild cardiomegaly and mild interstitial prominence. Mild interstitial edema and/or pneumonitis cannot be excluded.  2D Echo:  Impressions: - Technically difficult study with poor acoustic windows. Normal LV   size with mild LV hypertrophy. EF 55-60%. Mildly D-shaped   interventricular septum is suggestive of a degree of RV   pressure/volume overload. Mild to moderate RV dilation with   normal systolic function. Unable to estimate PA systolic pressure   on this study  Consultations: Cardiology - Dr. Rennis Golden  Discharge Instructions: Discharge Instructions    AMB Referral to [facility] Care Management    Complete by:  As directed    Please assign UMR member for post discharge  call. Currently at [facility]. Denies having any Link to Wellness needs at this time. Thanks and please call with questions. Thanks.Raiford Noble, MSN-Ed, [facility] Liaison-[phone]   Reason for consult:  Please assign UMR member for post discharge call   Expected date of contact:  1-3 days (reserved for hospital discharges)   Call MD for:  difficulty breathing, headache or visual disturbances    Complete by:  As directed    Call MD for:  extreme fatigue    Complete by:  As directed    Call MD for:  persistant dizziness or light-headedness    Complete by:  As directed    Diet - low sodium heart healthy    Complete by:  As directed    Discharge instructions    Complete by:  As directed    You have an abnormal heart rhythm called atrial flutter. This can cause your heart rate to increase to unsafe levels and can cause symptoms of racing heart, palpitations, and shortness of breath. We have started you on a medication to control your heart rate called Diltiazem, please take this every day. We have also started a medication  called Xarelto to thin your blood and reduce the risk of stroke. You may be able to have your heart shocked back into a normal rhythm by Cardiology, but will need to take this blood thinner regularly for 3 weeks first. You will have an appointment arranged with Cardiology in 3 weeks to follow up on this.  It will also be important to follow up with your Primary Doctor after your hospitalization. We have stopped your regular blood pressure medication because of the new diltiazem medication. You may need to restart this medication, but should check with your Primary Doctor first.  We have also noted that your calcium level was slightly elevated and this should be followed by your Primary Doctor outside the hospital.   Increase activity slowly    Complete by:  As directed       Signed: Carolynn Comment, MD 01/09/2017, 10:54 AM   Pager: [phone]

## 2017-01-09 NOTE — Progress Notes (Addendum)
   Subjective:  Pt is feeling well. No SOB or CP. Rates intermittently controlled yesterday. Controlled this AM on tele. Increase dose of Dilt prior to discharge. Will plan to DC with PCP and Cards f/u.  Objective:  Vital signs in last 24 hours: Vitals:   01/08/17 0932 01/08/17 1315 01/08/17 2018 01/09/17 0500  BP: 120/80 119/79 125/80 110/65  Pulse: 79 81 68 77  Resp:  Temp:  98.6 F (37 C) 98.8 F (37.1 C) 97.4 F (36.3 C)  TempSrc:  Tympanic Oral Oral  SpO2:  98% 94% 99%  Weight:      Height:       Physical Exam  Constitutional: He is oriented to person, place, and time. He is cooperative. No distress.  HENT:  Head: Normocephalic and atraumatic.  Cardiovascular: Normal rate, regular rhythm, S1 normal and S2 normal.  Exam reveals no gallop.   No murmur heard. Pulmonary/Chest: Effort normal and breath sounds normal. He has no wheezes. He has no rhonchi. He has no rales.  Abdominal: Soft. Normal appearance and bowel sounds are normal. He exhibits no distension. There is no tenderness. There is no rebound.  Neurological: He is alert and oriented to person, place, and time.  Skin: Skin is warm, dry and intact.  Psychiatric: He has a normal mood and affect.   Assessment/Plan:  Active Problems:   Atrial flutter (HCC)  New onset atrial flutter: Pt persists in A flutter with occasional rapid rates to 130-140s. Asymptomatic during these episodes. BP stable. Now on oral dilt and Xarelto. Plan to f/u with Cards for possible cardioversion in 3 wks. - Increase Diltiazem XR PO  qD - Xarelto - Cards f/u  Hypercalcemia: Ca remains mildly elevated at 10.5. No overt symptoms. Will check PTH. - continue to follow up outpatient.   Sleep apnea: He is currently on CPAP and is compliant - Consider BiPap given obesity and concern for OHS, f/u w/ pulmonology  HTN: On diltiazem, holding home losartan-HCTZ. - hold home antihypertensive on discharge  Chronic back pain -  continue oxycontin and percoset   DVT PPx - Xarelto  Code Status - Full  Dispo: Anticipated discharge today.  Carolynn Comment, MD 01/09/2017, 7:28 AM

## 2017-01-09 NOTE — Progress Notes (Signed)
Order received to discharge Pt.  Telemetry removed and returned to cubby.  Pt education given with significant other at bedside.  All questions answered.    Education sheets given on cardizem and xarelto.  Pt denies sob or chest pain.  Pt stable to discharge.

## 2017-01-09 NOTE — Progress Notes (Deleted)
Internal Medicine Attending:   I saw and examined the patient. I reviewed the resident's note and I agree with the resident's findings and plan as documented in the resident's note. He feels well, no complaints,  Pulse appears regular, rate is 70-80, no murmurs on exam, trace edema of lower extremities. Tele reviewed did have some RVR to 140s this AM when exerting himself, we had planned to convert over to increased short acting dose, however he is now well controlled before he received the higher cardiazem 60 q6 dose.  There for we will discharge him on the Cardiazem ER  daily dose.

## 2017-01-10 ENCOUNTER — Other Ambulatory Visit: Payer: Self-pay | Admitting: *Deleted

## 2017-01-10 ENCOUNTER — Encounter: Payer: Self-pay | Admitting: *Deleted

## 2017-01-10 LAB — PARATHYROID HORMONE, INTACT (NO CA): PTH: 83 pg/mL — ABNORMAL HIGH (ref 15–65)

## 2017-01-10 LAB — CALCIUM, IONIZED: CALCIUM, IONIZED, SERUM: 5.8 mg/dL — AB (ref 4.5–5.6)

## 2017-01-10 NOTE — Patient Outreach (Signed)
[facility]) Care Management  01/10/2017  Charles Barnett [birth year] [id number]   Subjective: Telephone call to patient's home / mobile number, spoke with patient, patient's wife Charles Barnett)  and HIPAA verified.  Patient gave [facility] verbal consent to speak with wife Charles Barnett) regarding healthcare needs as needed.  Discussed [facility] Care Management UMR Transition of care follow up, patient voiced understanding, wife voiced understanding, and were in agreement to follow up.    Spoke with patient and wife on speaker phone. Patient states he is doing ok, he or his wife will call new primary MD's office, will call cardiologist office to set up hospital follow up appointment, and blood pressure medicine management.  Wife states she is wondering if patient would be eligible for disability or Medicaid because he has not been able to work in years, and it is causing financial hardship.  RNCM discussed the following community resources and wife states she will follow up as appropriate: setting up payment plan via [employer] for hospital and [facility] provider expenses, [employer], [place] services.  States patient is being followed by pain management specialist and has CPAP supplies when  increases financial burden.   Wife states she follow up on the above identified resources and contact this RNCM if she needs any further assistance.  Patient states he does not have any transition of care, care coordination, disease management, disease monitoring, transportation, community resource, or pharmacy needs at this time.  Patient and wife states they are very appreciative of the follow up and are in agreement to receive [facility] Care Management information.  Telephone call to [facility])  at [facility] Care Management discussed case and above community resources, no additional resources identified.  Given contact number for Disability for phone consultation  [phone]), to apply on line ([email or web address] disability), or via phone.  Telephone call to patient's home / mobile number, spoke with patient, patient's wife Charles Barnett) on speaker phone, and HIPAA verified.   Discussed and patient given contact information for disability. Wife states the last time she applied for disability for her husband was approximately 2 years ago online and he received a denial.  Wife states she called an attorney after receipt of last disability denial, was told by attorney that patient would not qualify because wife made too much money.   Wife states she will follow up and was appreciative of the contact information.    Objective: Per chart review, patient hospitalized  01/07/17 -01/09/17 for Atrial Flutter.   Patient also has a history of hypertension, OSA, and hypercalcemia.   Assessment: Received UMR Transition of care referral on 01/09/17.  Transition of care follow up completed, no care management needs, and will proceed with case closure.    Plan: RNCM will send patient successful outreach letter, [facility] pamphlet, and magnet. RNCM will send case closure due to follow up completed / no care management needs request to Charles Barnett at [facility] Care Management.   Charles Barnett, BSN, CCM [facility] Care Management [facility] Telephonic CM Phone: [phone] Fax: [phone]

## 2017-01-13 ENCOUNTER — Encounter: Payer: Self-pay | Admitting: Physician Assistant

## 2017-01-13 ENCOUNTER — Ambulatory Visit (INDEPENDENT_AMBULATORY_CARE_PROVIDER_SITE_OTHER): Payer: 59 | Admitting: Physician Assistant

## 2017-01-13 VITALS — BP 133/90 | HR 95 | Temp 97.6°F | Resp 17 | Ht 72.0 in | Wt >= 6400 oz

## 2017-01-13 DIAGNOSIS — I4892 Unspecified atrial flutter: Secondary | ICD-10-CM | POA: Diagnosis not present

## 2017-01-13 DIAGNOSIS — G4733 Obstructive sleep apnea (adult) (pediatric): Secondary | ICD-10-CM

## 2017-01-13 NOTE — Patient Instructions (Addendum)
Please await contact for sleep study. I am rechecking labs.  Your parathyroid was high, and needs further evaluation. Please make sure that you make your cardiology appointment.  This will also be a good time to recheck your blood pressure and decide if we need to add another blood pressure.  Please check it twice per week at home. I will contact you on your blood work.   DASH Eating Plan DASH stands for "Dietary Approaches to Stop Hypertension." The DASH eating plan is a healthy eating plan that has been shown to reduce high blood pressure (hypertension). It may also reduce your risk for type 2 diabetes, heart disease, and stroke. The DASH eating plan may also help with weight loss. What are tips for following this plan? General guidelines   Avoid eating more than 2,300 mg (milligrams) of salt (sodium) a day. If you have hypertension, you may need to reduce your sodium intake to 1,500 mg a day.  Limit alcohol intake to no more than 1 drink a day for nonpregnant women and 2 drinks a day for men. One drink equals 12 oz of beer, 5 oz of wine, or 1 oz of hard liquor.  Work with your health care provider to maintain a healthy body weight or to lose weight. Ask what an ideal weight is for you.  Get at least 30 minutes of exercise that causes your heart to beat faster (aerobic exercise) most days of the week. Activities may include walking, swimming, or biking.  Work with your health care provider or diet and nutrition specialist (dietitian) to adjust your eating plan to your individual calorie needs. Reading food labels   Check food labels for the amount of sodium per serving. Choose foods with less than 5 percent of the Daily Value of sodium. Generally, foods with less than 300 mg of sodium per serving fit into this eating plan.  To find whole grains, look for the word "whole" as the first word in the ingredient list. Shopping   Buy products labeled as "low-sodium" or "no salt added."  Buy  fresh foods. Avoid canned foods and premade or frozen meals. Cooking   Avoid adding salt when cooking. Use salt-free seasonings or herbs instead of table salt or sea salt. Check with your health care provider or pharmacist before using salt substitutes.  Do not fry foods. Cook foods using healthy methods such as baking, boiling, grilling, and broiling instead.  Cook with heart-healthy oils, such as olive, canola, soybean, or sunflower oil. Meal planning    Eat a balanced diet that includes:  5 or more servings of fruits and vegetables each day. At each meal, try to fill half of your plate with fruits and vegetables.  Up to 6-8 servings of whole grains each day.  Less than 6 oz of lean meat, poultry, or fish each day. A 3-oz serving of meat is about the same size as a deck of cards. One egg equals 1 oz.  2 servings of low-fat dairy each day.  A serving of nuts, seeds, or beans 5 times each week.  Heart-healthy fats. Healthy fats called Omega-3 fatty acids are found in foods such as flaxseeds and coldwater fish, like sardines, salmon, and mackerel.  Limit how much you eat of the following:  Canned or prepackaged foods.  Food that is high in trans fat, such as fried foods.  Food that is high in saturated fat, such as fatty meat.  Sweets, desserts, sugary drinks, and other foods with  added sugar.  Full-fat dairy products.  Do not salt foods before eating.  Try to eat at least 2 vegetarian meals each week.  Eat more home-cooked food and less restaurant, buffet, and fast food.  When eating at a restaurant, ask that your food be prepared with less salt or no salt, if possible. What foods are recommended? The items listed may not be a complete list. Talk with your dietitian about what dietary choices are best for you. Grains  Whole-grain or whole-wheat bread. Whole-grain or whole-wheat pasta. Brown rice. Orpah Cobb. Bulgur. Whole-grain and low-sodium cereals. Pita bread.  Low-fat, low-sodium crackers. Whole-wheat flour tortillas. Vegetables  Fresh or frozen vegetables (raw, steamed, roasted, or grilled). Low-sodium or reduced-sodium tomato and vegetable juice. Low-sodium or reduced-sodium tomato sauce and tomato paste. Low-sodium or reduced-sodium canned vegetables. Fruits  All fresh, dried, or frozen fruit. Canned fruit in natural juice (without added sugar). Meat and other protein foods  Skinless chicken or [place]. Ground chicken or [place]. Pork with fat trimmed off. Fish and seafood. Egg whites. Dried beans, peas, or lentils. Unsalted nuts, nut butters, and seeds. Unsalted canned beans. Lean cuts of beef with fat trimmed off. Low-sodium, lean deli meat. Dairy  Low-fat (1%) or fat-free (skim) milk. Fat-free, low-fat, or reduced-fat cheeses. Nonfat, low-sodium ricotta or cottage cheese. Low-fat or nonfat yogurt. Low-fat, low-sodium cheese. Fats and oils  Soft margarine without trans fats. Vegetable oil. Low-fat, reduced-fat, or light mayonnaise and salad dressings (reduced-sodium). Canola, safflower, olive, soybean, and sunflower oils. Avocado. Seasoning and other foods  Herbs. Spices. Seasoning mixes without salt. Unsalted popcorn and pretzels. Fat-free sweets. What foods are not recommended? The items listed may not be a complete list. Talk with your dietitian about what dietary choices are best for you. Grains  Baked goods made with fat, such as croissants, muffins, or some breads. Dry pasta or rice meal packs. Vegetables  Creamed or fried vegetables. Vegetables in a cheese sauce. Regular canned vegetables (not low-sodium or reduced-sodium). Regular canned tomato sauce and paste (not low-sodium or reduced-sodium). Regular tomato and vegetable juice (not low-sodium or reduced-sodium). Rosita Fire. Olives. Fruits  Canned fruit in a light or heavy syrup. Fried fruit. Fruit in cream or butter sauce. Meat and other protein foods  Fatty cuts of meat. Ribs. Fried meat.  Tomasa Blase. Sausage. Bologna and other processed lunch meats. Salami. Fatback. Hotdogs. Bratwurst. Salted nuts and seeds. Canned beans with added salt. Canned or smoked fish. Whole eggs or egg yolks. Chicken or [place] with skin. Dairy  Whole or 2% milk, cream, and half-and-half. Whole or full-fat cream cheese. Whole-fat or sweetened yogurt. Full-fat cheese. Nondairy creamers. Whipped toppings. Processed cheese and cheese spreads. Fats and oils  Butter. Stick margarine. Lard. Shortening. Ghee. Bacon fat. Tropical oils, such as coconut, palm kernel, or palm oil. Seasoning and other foods  Salted popcorn and pretzels. Onion salt, garlic salt, seasoned salt, table salt, and sea salt. Worcestershire sauce. Tartar sauce. Barbecue sauce. Teriyaki sauce. Soy sauce, including reduced-sodium. Steak sauce. Canned and packaged gravies. Fish sauce. Oyster sauce. Cocktail sauce. Horseradish that you find on the shelf. Ketchup. Mustard. Meat flavorings and tenderizers. Bouillon cubes. Hot sauce and Tabasco sauce. Premade or packaged marinades. Premade or packaged taco seasonings. Relishes. Regular salad dressings. Where to find more information:  National Heart, Lung, and Blood Institute: PopSteam.is  American Heart Association: www.heart.org Summary  The DASH eating plan is a healthy eating plan that has been shown to reduce high blood pressure (hypertension). It may also reduce  your risk for type 2 diabetes, heart disease, and stroke.  With the DASH eating plan, you should limit salt (sodium) intake to 2,300 mg a day. If you have hypertension, you may need to reduce your sodium intake to 1,500 mg a day.  When on the DASH eating plan, aim to eat more fresh fruits and vegetables, whole grains, lean proteins, low-fat dairy, and heart-healthy fats.  Work with your health care provider or diet and nutrition specialist (dietitian) to adjust your eating plan to your individual calorie needs. This information is  not intended to replace advice given to you by your health care provider. Make sure you discuss any questions you have with your health care provider. Document Released: 08/22/2011 Document Revised: 08/26/2016 Document Reviewed: 08/26/2016 Elsevier Interactive Patient Education  2017 [employer].

## 2017-01-15 MED ORDER — CETIRIZINE HCL 10 MG PO TABS: 10.0000 mg | ORAL_TABLET | Freq: Every day | ORAL | 1 refills | Status: DC

## 2017-01-15 MED ORDER — HYDROXYZINE HCL 25 MG PO TABS: 12.5000 mg | ORAL_TABLET | Freq: Three times a day (TID) | ORAL | 0 refills | Status: AC | PRN

## 2017-01-15 MED FILL — hydrOXYzine HCL 25 MG TABS: 25 | 7 days supply | Qty: 21 | Fill #0

## 2017-01-15 MED FILL — ALL DAY ALLERGY 10 MG TAB: 10 | 100 days supply | Qty: 100 | Fill #0

## 2017-01-15 NOTE — Progress Notes (Signed)
PRIMARY CARE AT [facility] [address], [place] [address] [phone]  Date:  01/13/2017   Name:  Charles Barnett   DOB:  1965/11/22   MRN:  [id number]  PCP:  Delman Cheadle, MD    History of Present Illness:  Charles Barnett is a 51 y.o. male patient who presents to PCP with  Chief Complaint  Patient presents with  . Hospitalization Follow-up  . Rash    itching on back onset 5 days     Patient is here for follow up following hospitalization after he came to our outpatient clinic for cough.  Atrial flutter was appreciated, as new onset, and he was sent to the ED for follow up.   He reports that he is doing fine.  Compliant on the xarelto, that was started at the hospital.  He is also taking the cardizem at this time.  Reports no palpitations, sob, or chest pains--though the AFlutter he never had this.  His cough has resolved.   He reports pruritic rash along his upper back and arms that started 5 days ago.  He has noticed no obvious rash besides itching.  He also had a nose bleed today that lasted for about 20 minutes with bleeding from both nostrils.  He generally does have nose bleeds when he is overheated.  No dizziness or fatigue.   At discharge, he was advised to follow up.  Labs reveal pth that is elevated and hypercalcemia.    Patient Active Problem List   Diagnosis Date Noted  . Hypercalcemia 01/09/2017  . Atrial flutter ([place]) 01/07/2017  . Left shoulder pain 10/18/2016  . Right knee pain 10/18/2016  . Apnea, sleep 01/06/2014  . Cannot sleep 10/01/2013  . Body mass index (BMI) of 50-59.9 in adult ([place]) 06/25/2013  . Postoperative back pain 05/27/2013  . BP (high blood pressure) 05/27/2013  . H/O disease 03/22/2013    Past Medical History:  Diagnosis Date  . Arthritis   . Atrial flutter ([place]) 12/2016  . Hypertension   . Neuromuscular disorder ([place])   . Sleep apnea     Past Surgical History:  Procedure Laterality Date  . SPINE SURGERY      Social History   Substance Use Topics  . Smoking status: Never Smoker  . Smokeless tobacco: Never Used  . Alcohol use No    Family History  Problem Relation Age of Onset  . Diabetes Mother   . Hyperlipidemia Mother   . Hypertension Mother   . Heart disease Mother   . Diabetes Sister   . Hypertension Sister   . Heart disease Sister     Allergies  Allergen Reactions  . Ibuprofen Other (See Comments)    "bloody stools"  . Iodine Swelling    Contrast dye  . Iodinated Diagnostic Agents Swelling    Medication list has been reviewed and updated.  Current Outpatient Prescriptions on File Prior to Visit  Medication Sig Dispense Refill  . diltiazem (CARDIZEM CD) 240 MG 24 hr capsule Take 1 capsule (240 mg total) by mouth daily. 30 capsule 0  . oxyCODONE (OXYCONTIN) 40 mg 12 hr tablet Take 20 mg by mouth every 12 (twelve) hours.   0  . oxyCODONE-acetaminophen (PERCOCET) 10-325 MG tablet Take 1 tablet by mouth every 6 (six) hours as needed for pain.     . rivaroxaban (XARELTO) 20 MG TABS tablet Take 1 tablet (20 mg total) by mouth daily with supper. 30 tablet 0  . tiZANidine (ZANAFLEX) 4  MG tablet Take 4 mg by mouth 3 (three) times daily as needed for muscle spasms.      No current facility-administered medications on file prior to visit.     ROS ROS otherwise unremarkable unless listed above.  Physical Examination: BP 133/90 (BP Location: Right Arm, Patient Position: Sitting, Cuff Size: Large)   Pulse 95   Temp 97.6 F (36.4 C) (Oral)   Resp 17   Ht 6' (1.829 m)   Wt (!) 431 lb (195.5 kg)   SpO2 95%   BMI 58.45 kg/m  Ideal Body Weight: Weight in (lb) to have BMI = 25: 183.9  Physical Exam  Constitutional: He is oriented to person, place, and time. He appears well-developed and well-nourished. No distress.  HENT:  Head: Normocephalic and atraumatic.  Eyes: Conjunctivae and EOM are normal. Pupils are equal, round, and reactive to light.  Cardiovascular: Normal rate and regular rhythm.   Exam reveals no friction rub.   No murmur heard. Pulmonary/Chest: Effort normal. No respiratory distress.  Neurological: He is alert and oriented to person, place, and time.  Skin: Skin is warm and dry. He is not diaphoretic.  Psychiatric: He has a normal mood and affect. His behavior is normal.     Assessment and Plan: Charles Barnett is a 51 y.o. male who is here today for cc of hospitalization follow up.   We will follow up with recheck sleep study. Will recheck metabolic panel and vitamin D.  Also rechecking cbc. Continue the xarelto at this time.  Advised to use nasal saline daily. He will follow up with cardiology in 2 weeks.  Advised to check blood pressure twice per week and advise if elevated.  May have to re-introduce the additional anti-hypertensive(s) at that time.  Dash diet discussed and handout given Advised of alarming sxs to warrant immediate return   Atrial flutter, unspecified type ([place]) - Plan: CMP14+EGFR, Vitamin D 1,25 dihydroxy, CBC  OSA (obstructive sleep apnea) - Plan: Ambulatory referral to Sleep Studies  Morbid obesity ([place]) - Plan: Ambulatory referral to Sleep Studies  Ivar Drape, PA-C [facility] 5/2/201811:45 AM

## 2017-01-20 DIAGNOSIS — G4733 Obstructive sleep apnea (adult) (pediatric): Secondary | ICD-10-CM | POA: Diagnosis not present

## 2017-01-20 DIAGNOSIS — M25519 Pain in unspecified shoulder: Secondary | ICD-10-CM | POA: Diagnosis not present

## 2017-01-20 DIAGNOSIS — R0902 Hypoxemia: Secondary | ICD-10-CM | POA: Diagnosis not present

## 2017-01-20 DIAGNOSIS — G894 Chronic pain syndrome: Secondary | ICD-10-CM | POA: Diagnosis not present

## 2017-01-20 DIAGNOSIS — M25569 Pain in unspecified knee: Secondary | ICD-10-CM | POA: Diagnosis not present

## 2017-01-20 DIAGNOSIS — M47817 Spondylosis without myelopathy or radiculopathy, lumbosacral region: Secondary | ICD-10-CM | POA: Diagnosis not present

## 2017-01-20 MED FILL — OXYCODONE-ACETAMINOPHEN 10-: 10-325 | 30 days supply | Qty: 120 | Fill #0

## 2017-01-20 MED FILL — tiZANidine HCL 4 MG TABS: 4 | 30 days supply | Qty: 30 | Fill #0

## 2017-01-20 MED FILL — OxyCONTIN 20 MG T12A: 20 | 30 days supply | Qty: 60 | Fill #0

## 2017-01-22 LAB — CMP14+EGFR
ALT: 30 IU/L (ref 0–44)
AST: 25 IU/L (ref 0–40)
Albumin/Globulin Ratio: 1.7 (ref 1.2–2.2)
Albumin: 4.3 g/dL (ref 3.5–5.5)
Alkaline Phosphatase: 75 IU/L (ref 39–117)
BUN/Creatinine Ratio: 11 (ref 9–20)
BUN: 8 mg/dL (ref 6–24)
Bilirubin Total: 0.3 mg/dL (ref 0.0–1.2)
CALCIUM: 10.6 mg/dL — AB (ref 8.7–10.2)
CO2: 23 mmol/L (ref 18–29)
CREATININE: 0.76 mg/dL (ref 0.76–1.27)
Chloride: 103 mmol/L (ref 96–106)
GFR, EST AFRICAN AMERICAN: 122 mL/min/{1.73_m2} (ref >59)
GFR, EST NON AFRICAN AMERICAN: 106 mL/min/{1.73_m2} (ref >59)
Globulin, Total: 2.6 g/dL (ref 1.5–4.5)
Glucose: 102 mg/dL — ABNORMAL HIGH (ref 65–99)
Potassium: 5.1 mmol/L (ref 3.5–5.2)
Sodium: 142 mmol/L (ref 134–144)
TOTAL PROTEIN: 6.9 g/dL (ref 6.0–8.5)

## 2017-01-22 LAB — CBC
HEMATOCRIT: 40.6 % (ref 37.5–51.0)
HEMOGLOBIN: 12.7 g/dL — AB (ref 13.0–17.7)
MCH: 27.3 pg (ref 26.6–33.0)
MCHC: 31.3 g/dL — AB (ref 31.5–35.7)
MCV: 87 fL (ref 79–97)
Platelets: 254 10*3/uL (ref 150–379)
RBC: 4.65 x10E6/uL (ref 4.14–5.80)
RDW: 14.8 % (ref 12.3–15.4)
WBC: 9.2 10*3/uL (ref 3.4–10.8)

## 2017-01-22 LAB — STATUS REPORT

## 2017-01-22 LAB — VITAMIN D 1,25 DIHYDROXY

## 2017-01-26 NOTE — Progress Notes (Signed)
Cardiology Office Note    Date:  01/27/2017   ID:  Charles Bergerlton D Blane, DOB 03-Dec-1965, MRN [id number]  PCP:  Sherren MochaShaw, Eva N, MD  Cardiologist: Dr. Rennis GoldenHilty  Chief Complaint  Patient presents with  . Follow-up    Atrial Flutter    History of Present Illness:    Charles Barnett is a 51 y.o. male with past medical history of morbid obesity, OSA (on CPAP), HTN, and neuromuscular disorder (s/p spinal stimulator) who presents to the office today for hospital follow-up.   He was recently [facility] from 4/[facility] - 01/09/2017 for a one week history of fatigue and progressive dyspnea. Upon arrival, she was found to be in atrial flutter with RVR. TSH, Mg, and electrolytes were within normal limits. Troponin values remained negative. Echocardiogram showed a preserved EF of 55-60% with no significant valve abnormalities. He was initially placed on IV Cardizem with this being switched to PO Cardizem CD 240mg  daily at the time of discharge. He was placed on Xarelto 20 mg dialy with plans for an outpatient DCCV in 3 weeks if he remains in atrial flutter.   In talking with the patient today, he denies any recent chest discomfort or palpitations. Reports he had initially presented to the hospital for a "severe cold". His symptoms from this have resolved. He has noted dyspnea on exertion occurring with minimal activity. He has not checked his heart rate or blood pressure regularly at home but his wife presents blood pressure and heart rate readings from prior to his hospitalization which were within normal range with a pulse in the 60's at that time.  He reports good compliance with his Xarelto and denies missing any recent doses. No melena, hematochezia, or hematuria. Does have occasional nosebleeds which were also occurring prior to the initiation of this. He has also noticed itching along his back and lower extremities since starting Cardizem and Xarelto.   Past Medical History:  Diagnosis Date  .  Arthritis   . Atrial flutter ([facility]) 12/2016  . Hypertension   . Neuromuscular disorder ([facility])   . Sleep apnea     Past Surgical History:  Procedure Laterality Date  . SPINE SURGERY      Current Medications: Outpatient Medications Prior to Visit  Medication Sig Dispense Refill  . cetirizine (ZYRTEC) 10 MG tablet Take 1 tablet (10 mg total) by mouth daily. 30 tablet 1  . oxyCODONE-acetaminophen (PERCOCET) 10-325 MG tablet Take 1 tablet by mouth every 6 (six) hours as needed for pain.     . rivaroxaban (XARELTO) 20 MG TABS tablet Take 1 tablet (20 mg total) by mouth daily with supper. 30 tablet 0  . tiZANidine (ZANAFLEX) 4 MG tablet Take 4 mg by mouth 3 (three) times daily as needed for muscle spasms.     Marland Kitchen. diltiazem (CARDIZEM CD) 240 MG 24 hr capsule Take 1 capsule (240 mg total) by mouth daily. 30 capsule 0  . oxyCODONE (OXYCONTIN) 40 mg 12 hr tablet Take 20 mg by mouth every 12 (twelve) hours.   0   No facility-administered medications prior to visit.      Allergies:   Ibuprofen; Iodine; and Iodinated diagnostic agents   Social History   Social History  . Marital status: Married    Spouse name: N/A  . Number of children: 3  . Years of education: N/A   Social History Main Topics  . Smoking status: Never Smoker  . Smokeless tobacco: Never Used  . Alcohol  use No  . Drug use: No  . Sexual activity: Yes    Partners: Female   Other Topics Concern  . None   Social History Narrative  . None     Family History:  The patient's family history includes Diabetes in his mother and sister; Heart disease in his mother and sister; Hyperlipidemia in his mother; Hypertension in his mother and sister.   Review of Systems:   Please see the history of present illness.     General:  No chills, fever, night sweats or weight changes.  Cardiovascular:  No chest pain, edema, orthopnea, palpitations, paroxysmal nocturnal dyspnea. Positive for dyspnea on exertion.  Dermatological: No rash,  lesions/masses Respiratory: No cough, dyspnea Urologic: No hematuria, dysuria Abdominal:   No nausea, vomiting, diarrhea, bright red blood per rectum, melena, or hematemesis Neurologic:  No visual changes, wkns, changes in mental status. All other systems reviewed and are otherwise negative except as noted above.   Physical Exam:    VS:  BP 110/72   Pulse (!) 108   Ht 6' (1.829 m)   Wt (!) 434 lb (196.9 kg)   BMI 58.86 kg/m    General: Well developed, obese,male appearing in no acute distress. Head: Normocephalic, atraumatic, sclera non-icteric, no xanthomas, nares are without discharge.  Neck: No carotid bruits. JVD not elevated.  Lungs: Respirations regular and unlabored, without wheezes or rales.  Heart: Tachycardiac, irregular irregular. No S3 or S4.  No murmur, no rubs, or gallops appreciated. Abdomen: Soft, non-tender, non-distended with normoactive bowel sounds. No hepatomegaly. No rebound/guarding. No obvious abdominal masses. Msk:  Strength and tone appear normal for age. No joint deformities or effusions. Extremities: No clubbing or cyanosis. No lower extremity edema.  Distal pedal pulses are 2+ bilaterally. Neuro: Alert and oriented X 3. Moves all extremities spontaneously. No focal deficits noted. Psych:  Responds to questions appropriately with a normal affect. Skin: No rashes or lesions noted  Wt Readings from Last 3 Encounters:  01/27/17 (!) 434 lb (196.9 kg)  01/13/17 (!) 431 lb (195.5 kg)  01/07/17 (!) 414 lb 7.4 oz (188 kg)     Studies/Labs Reviewed:   EKG:  EKG is ordered today.  The ekg ordered today demonstrates atrial flutter, HR 108.   Recent Labs: 01/07/2017: Magnesium 1.8; TSH 2.350 01/08/2017: Hemoglobin 13.4 01/13/2017: ALT 30; BUN 8; Creatinine, Ser 0.76; Platelets 254; Potassium 5.1; Sodium 142   Lipid Panel    Component Value Date/Time   CHOL 176 08/08/2015 1348   TRIG 96 08/08/2015 1348   HDL 43 08/08/2015 1348   CHOLHDL 4.1 08/08/2015 1348    VLDL 19 08/08/2015 1348   LDLCALC 114 08/08/2015 1348    Additional studies/ records that were reviewed today include:   Echocardiogram: 01/08/2017 Study Conclusions  - Left ventricle: The cavity size was normal. Wall thickness was   increased in a pattern of mild LVH. Systolic function was normal.   The estimated ejection fraction was in the range of 55% to 60%.   Indeterminant diastolic function (atrial fibrillation). Although   no diagnostic regional wall motion abnormality was identified,   this possibility cannot be completely excluded on the basis of   this study. - Ventricular septum: Mildly D-shaped interventricular septum,   suggestive of RV pressure/volume overload. - Aortic valve: There was no stenosis. - Mitral valve: There was no significant regurgitation. - Right ventricle: The cavity size was mildly to moderately   dilated. Systolic function was normal. - Right atrium: The  atrium was mildly dilated. - Atrial septum: Atrial septal aneurysm noted. - Pulmonary arteries: No complete TR doppler jet so unable to   estimate PA systolic pressure. - Systemic veins: IVC was not visualized.  Impressions:  - Technically difficult study with poor acoustic windows. Normal LV   size with mild LV hypertrophy. EF 55-60%. Mildly D-shaped   interventricular septum is suggestive of a degree of RV   pressure/volume overload. Mild to moderate RV dilation with   normal systolic function. Unable to estimate PA systolic pressure   on this study.  Assessment:    1. Paroxysmal atrial fibrillation (HCC)   2. Pre-operative laboratory examination   3. Current use of long term anticoagulation   4. OSA (obstructive sleep apnea)   5. Morbid obesity (HCC)      Plan:   In order of problems listed above:  1. Paroxysmal Atrial Flutter/ Use of Long-term Anticoagulation - admitted from 4/24 - 01/09/2017 for atrial flutter with RVR in the setting of recovering of a viral illness. TSH,  Mg, and electrolytes were within normal limits. Echocardiogram showed a preserved EF of 55-60% with no significant valve abnormalities. He was started on Xarelto with plans for a DCCV in 3 weeks is he remained in atrial flutter. - he remains in atrial flutter today by EKG and examination.  - This patients CHA2DS2-VASc Score and unadjusted Ischemic Stroke Rate (% per year) is equal to 0.6 % stroke rate/year from a score of 1 (HTN). Currently in Xarelto with plans for an upcoming DCCV. Reports good compliance with his medication regimen. Has been on Xarelto for 3 weeks as of tomorrow and denies missing any doses. No evidence of active bleeding. - will arrange for DCCV on Thursday. Risks and benefits reviewed with the patient and he agrees to proceed. Will schedule with Dr. Eden Emms as the patient wishes to have the procedure this week and Dr. Rennis Golden will not be in the hospital. He has a spinal stimulator in place and was informed to cut this off prior to the procedure and to bring his control for this with him to the hospital.  - switch from Cardizem CD 240mg  daily to Lopressor 50mg  BID to see if this assists with his severe itching (symptoms started after being started on Cardizem and Xarelto - unlikely side-effect of Xarelto and only a 2% chance of this with Cardizem). If no improvement with the medication adjustment, he should follow-up with his PCP.   2. OSA - Reports good compliance with his CPAP.  3. Morbid Obesity - BMI of 58.86 - importance of diet and exercise reviewed with the patient.    Medication Adjustments/Labs and Tests Ordered: Current medicines are reviewed at length with the patient today.  Concerns regarding medicines are outlined above.  Medication changes, Labs and Tests ordered today are listed in the Patient Instructions below. Patient Instructions  Medication Instructions:  STOP- Diltiazem START- Metoprolol 50 mg twice a day  Labwork: Pre- Op Lab  Testing/Procedures: Your  physician has recommended that you have a Cardioversion (DCCV). Electrical Cardioversion uses a jolt of electricity to your heart either through paddles or wired patches attached to your chest. This is a controlled, usually prescheduled, procedure. Defibrillation is done under light anesthesia in the hospital, and you usually go home the day of the procedure. This is done to get your heart back into a normal rhythm. You are not awake for the procedure. Please see the instruction sheet given to you today.  Follow-Up: Your  physician recommends that you schedule a follow-up appointment in: 2 weeks after Cardioversion with Dr Rennis Golden  Any Other Special Instructions Will Be Listed Below (If Applicable).  If you need a refill on your cardiac medications before your next appointment, please call your pharmacy.   Signed, Ellsworth Lennox, PA-C  01/27/2017 1:27 PM    [facility] [address] [place], [address] [place], [place]  [address] Phone: [phone]; Fax: [phone]  [address] [place], [place] [address] Phone: [phone]

## 2017-01-27 ENCOUNTER — Encounter: Payer: Self-pay | Admitting: Student

## 2017-01-27 ENCOUNTER — Ambulatory Visit (INDEPENDENT_AMBULATORY_CARE_PROVIDER_SITE_OTHER): Payer: 59 | Admitting: Student

## 2017-01-27 ENCOUNTER — Telehealth: Payer: Self-pay | Admitting: Student

## 2017-01-27 VITALS — BP 110/72 | HR 108 | Ht 72.0 in | Wt >= 6400 oz

## 2017-01-27 DIAGNOSIS — G4733 Obstructive sleep apnea (adult) (pediatric): Secondary | ICD-10-CM | POA: Diagnosis not present

## 2017-01-27 DIAGNOSIS — D689 Coagulation defect, unspecified: Secondary | ICD-10-CM | POA: Diagnosis not present

## 2017-01-27 DIAGNOSIS — Z7901 Long term (current) use of anticoagulants: Secondary | ICD-10-CM | POA: Diagnosis not present

## 2017-01-27 DIAGNOSIS — Z01812 Encounter for preprocedural laboratory examination: Secondary | ICD-10-CM

## 2017-01-27 DIAGNOSIS — I4892 Unspecified atrial flutter: Secondary | ICD-10-CM | POA: Diagnosis not present

## 2017-01-27 DIAGNOSIS — I480 Paroxysmal atrial fibrillation: Secondary | ICD-10-CM | POA: Diagnosis not present

## 2017-01-27 LAB — CBC
HCT: 42.1 % (ref 38.5–50.0)
Hemoglobin: 13.5 g/dL (ref 13.2–17.1)
MCH: 28 pg (ref 27.0–33.0)
MCHC: 32.1 g/dL (ref 32.0–36.0)
MCV: 87.3 fL (ref 80.0–100.0)
MPV: 9.4 fL (ref 7.5–12.5)
Platelets: 229 K/uL (ref 140–400)
RBC: 4.82 MIL/uL (ref 4.20–5.80)
RDW: 14.5 % (ref 11.0–15.0)
WBC: 8.9 K/uL (ref 3.8–10.8)

## 2017-01-27 LAB — TSH: TSH: 1.76 m[IU]/L (ref 0.40–4.50)

## 2017-01-27 MED ORDER — METOPROLOL TARTRATE 50 MG PO TABS: 50.0000 mg | ORAL_TABLET | Freq: Two times a day (BID) | ORAL | 6 refills | Status: DC

## 2017-01-27 MED FILL — METOPROLOL TARTRATE 50 MG T: 50 | 30 days supply | Qty: 60 | Fill #0

## 2017-01-27 NOTE — Telephone Encounter (Signed)
Called patients home and gave appointment date and time with Dr. Rennis GoldenHilty on 02-13-17 at 10 a.m.

## 2017-01-27 NOTE — Patient Instructions (Signed)
Medication Instructions:  STOP- Diltiazem START- Metoprolol 50 mg twice a day  Labwork: Pre- Op Lab  Testing/Procedures: Your physician has recommended that you have a Cardioversion (DCCV). Electrical Cardioversion uses a jolt of electricity to your heart either through paddles or wired patches attached to your chest. This is a controlled, usually prescheduled, procedure. Defibrillation is done under light anesthesia in the hospital, and you usually go home the day of the procedure. This is done to get your heart back into a normal rhythm. You are not awake for the procedure. Please see the instruction sheet given to you today.  Follow-Up: Your physician recommends that you schedule a follow-up appointment in: 2 weeks after Cardioversion with Dr Rennis GoldenHilty   Any Other Special Instructions Will Be Listed Below (If Applicable).   If you need a refill on your cardiac medications before your next appointment, please call your pharmacy.

## 2017-01-28 LAB — BASIC METABOLIC PANEL
BUN: 11 mg/dL (ref 7–25)
CALCIUM: 10.3 mg/dL (ref 8.6–10.3)
CHLORIDE: 104 mmol/L (ref 98–110)
CO2: 25 mmol/L (ref 20–31)
Creat: 0.97 mg/dL (ref 0.70–1.33)
GLUCOSE: 96 mg/dL (ref 65–99)
POTASSIUM: 4.7 mmol/L (ref 3.5–5.3)
SODIUM: 138 mmol/L (ref 135–146)

## 2017-01-28 LAB — PROTIME-INR
INR: 1.1
Prothrombin Time: 12.1 s — ABNORMAL HIGH (ref 9.0–11.5)

## 2017-01-28 LAB — APTT: APTT: 34 s (ref 22–34)

## 2017-01-30 ENCOUNTER — Encounter (HOSPITAL_COMMUNITY)
Admission: RE | Disposition: A | Discharge status: Home / Self Care | Payer: Self-pay | Source: Ambulatory Visit | Attending: Cardiovascular Disease

## 2017-01-30 ENCOUNTER — Ambulatory Visit (HOSPITAL_COMMUNITY)
Admission: RE | Admit: 2017-01-30 | Discharge: 2017-01-30 | Disposition: A | Discharge status: Home / Self Care | Payer: 59 | Source: Ambulatory Visit | Attending: Cardiovascular Disease | Admitting: Cardiovascular Disease

## 2017-01-30 ENCOUNTER — Encounter (HOSPITAL_COMMUNITY): Payer: Self-pay | Admitting: *Deleted

## 2017-01-30 ENCOUNTER — Ambulatory Visit (HOSPITAL_COMMUNITY): Payer: 59 | Admitting: Certified Registered"

## 2017-01-30 ENCOUNTER — Encounter: Payer: Self-pay | Admitting: Physician Assistant

## 2017-01-30 DIAGNOSIS — I4892 Unspecified atrial flutter: Secondary | ICD-10-CM | POA: Insufficient documentation

## 2017-01-30 DIAGNOSIS — I10 Essential (primary) hypertension: Secondary | ICD-10-CM | POA: Insufficient documentation

## 2017-01-30 DIAGNOSIS — I253 Aneurysm of heart: Secondary | ICD-10-CM | POA: Insufficient documentation

## 2017-01-30 DIAGNOSIS — I480 Paroxysmal atrial fibrillation: Secondary | ICD-10-CM | POA: Diagnosis not present

## 2017-01-30 DIAGNOSIS — G4733 Obstructive sleep apnea (adult) (pediatric): Secondary | ICD-10-CM | POA: Diagnosis not present

## 2017-01-30 DIAGNOSIS — M1990 Unspecified osteoarthritis, unspecified site: Secondary | ICD-10-CM | POA: Diagnosis not present

## 2017-01-30 DIAGNOSIS — G709 Myoneural disorder, unspecified: Secondary | ICD-10-CM | POA: Insufficient documentation

## 2017-01-30 DIAGNOSIS — Z6841 Body Mass Index (BMI) 40.0 and over, adult: Secondary | ICD-10-CM | POA: Diagnosis not present

## 2017-01-30 DIAGNOSIS — G4730 Sleep apnea, unspecified: Secondary | ICD-10-CM | POA: Diagnosis not present

## 2017-01-30 DIAGNOSIS — Z91041 Radiographic dye allergy status: Secondary | ICD-10-CM | POA: Diagnosis not present

## 2017-01-30 DIAGNOSIS — Z7901 Long term (current) use of anticoagulants: Secondary | ICD-10-CM | POA: Diagnosis not present

## 2017-01-30 DIAGNOSIS — I481 Persistent atrial fibrillation: Secondary | ICD-10-CM | POA: Diagnosis not present

## 2017-01-30 DIAGNOSIS — I4819 Other persistent atrial fibrillation: Secondary | ICD-10-CM

## 2017-01-30 DIAGNOSIS — G894 Chronic pain syndrome: Secondary | ICD-10-CM | POA: Insufficient documentation

## 2017-01-30 DIAGNOSIS — M47817 Spondylosis without myelopathy or radiculopathy, lumbosacral region: Secondary | ICD-10-CM | POA: Insufficient documentation

## 2017-01-30 HISTORY — PX: CARDIOVERSION: SHX1299

## 2017-01-30 SURGERY — CARDIOVERSION
Anesthesia: General

## 2017-01-30 MED ORDER — PROPOFOL 10 MG/ML IV BOLUS
INTRAVENOUS | Status: DC | PRN
  Administered 2017-01-30: 100 mg via INTRAVENOUS
  Administered 2017-01-30: 60 mg via INTRAVENOUS

## 2017-01-30 MED ORDER — SODIUM CHLORIDE 0.9 % IV SOLN
INTRAVENOUS | Status: DC
  Administered 2017-01-30: 10:00:00 via INTRAVENOUS

## 2017-01-30 MED ORDER — LIDOCAINE 2% (20 MG/ML) 5 ML SYRINGE
INTRAMUSCULAR | Status: DC | PRN
  Administered 2017-01-30: 50 mg via INTRAVENOUS

## 2017-01-30 NOTE — CV Procedure (Signed)
DCC: Anesthesia Propofol Dr Noreene LarssonJoslin  ON Rx xarelto with no missed doses  [facility] 150 Joules Converted from atrial flutter rate 115 to NSR rate 70  No immediate neurologic sequelae  Charlton HawsPeter Santana Edell, MD

## 2017-01-30 NOTE — H&P (View-Only) (Signed)
Cardiology Office Note    Date:  01/27/2017   ID:  Charles Barnett, DOB 03-Dec-1965, MRN [id number]  PCP:  Sherren MochaShaw, Eva N, MD  Cardiologist: Dr. Rennis GoldenHilty  Chief Complaint  Patient presents with  . Follow-up    Atrial Flutter    History of Present Illness:    Charles Barnett is a 51 y.o. male with past medical history of morbid obesity, OSA (on CPAP), HTN, and neuromuscular disorder (s/p spinal stimulator) who presents to the office today for hospital follow-up.   He was recently admitted to [facility] from 4/24 - 01/09/2017 for a one week history of fatigue and progressive dyspnea. Upon arrival, she was found to be in atrial flutter with RVR. TSH, Mg, and electrolytes were within normal limits. Troponin values remained negative. Echocardiogram showed a preserved EF of 55-60% with no significant valve abnormalities. He was initially placed on IV Cardizem with this being switched to PO Cardizem CD 240mg  daily at the time of discharge. He was placed on Xarelto 20 mg dialy with plans for an outpatient DCCV in 3 weeks if he remains in atrial flutter.   In talking with the patient today, he denies any recent chest discomfort or palpitations. Reports he had initially presented to the hospital for a "severe cold". His symptoms from this have resolved. He has noted dyspnea on exertion occurring with minimal activity. He has not checked his heart rate or blood pressure regularly at home but his wife presents blood pressure and heart rate readings from prior to his hospitalization which were within normal range with a pulse in the 60's at that time.  He reports good compliance with his Xarelto and denies missing any recent doses. No melena, hematochezia, or hematuria. Does have occasional nosebleeds which were also occurring prior to the initiation of this. He has also noticed itching along his back and lower extremities since starting Cardizem and Xarelto.   Past Medical History:  Diagnosis Date  .  Arthritis   . Atrial flutter (HCC) 12/2016  . Hypertension   . Neuromuscular disorder (HCC)   . Sleep apnea     Past Surgical History:  Procedure Laterality Date  . SPINE SURGERY      Current Medications: Outpatient Medications Prior to Visit  Medication Sig Dispense Refill  . cetirizine (ZYRTEC) 10 MG tablet Take 1 tablet (10 mg total) by mouth daily. 30 tablet 1  . oxyCODONE-acetaminophen (PERCOCET) 10-325 MG tablet Take 1 tablet by mouth every 6 (six) hours as needed for pain.     . rivaroxaban (XARELTO) 20 MG TABS tablet Take 1 tablet (20 mg total) by mouth daily with supper. 30 tablet 0  . tiZANidine (ZANAFLEX) 4 MG tablet Take 4 mg by mouth 3 (three) times daily as needed for muscle spasms.     Marland Kitchen. diltiazem (CARDIZEM CD) 240 MG 24 hr capsule Take 1 capsule (240 mg total) by mouth daily. 30 capsule 0  . oxyCODONE (OXYCONTIN) 40 mg 12 hr tablet Take 20 mg by mouth every 12 (twelve) hours.   0   No facility-administered medications prior to visit.      Allergies:   Ibuprofen; Iodine; and Iodinated diagnostic agents   Social History   Social History  . Marital status: Married    Spouse name: N/A  . Number of children: 3  . Years of education: N/A   Social History Main Topics  . Smoking status: Never Smoker  . Smokeless tobacco: Never Used  . Alcohol  use No  . Drug use: No  . Sexual activity: Yes    Partners: Female   Other Topics Concern  . None   Social History Narrative  . None     Family History:  The patient's family history includes Diabetes in his mother and sister; Heart disease in his mother and sister; Hyperlipidemia in his mother; Hypertension in his mother and sister.   Review of Systems:   Please see the history of present illness.     General:  No chills, fever, night sweats or weight changes.  Cardiovascular:  No chest pain, edema, orthopnea, palpitations, paroxysmal nocturnal dyspnea. Positive for dyspnea on exertion.  Dermatological: No rash,  lesions/masses Respiratory: No cough, dyspnea Urologic: No hematuria, dysuria Abdominal:   No nausea, vomiting, diarrhea, bright red blood per rectum, melena, or hematemesis Neurologic:  No visual changes, wkns, changes in mental status. All other systems reviewed and are otherwise negative except as noted above.   Physical Exam:    VS:  BP 110/72   Pulse (!) 108   Ht 6' (1.829 m)   Wt (!) 434 lb (196.9 kg)   BMI 58.86 kg/m    General: Well developed, obese,male appearing in no acute distress. Head: Normocephalic, atraumatic, sclera non-icteric, no xanthomas, nares are without discharge.  Neck: No carotid bruits. JVD not elevated.  Lungs: Respirations regular and unlabored, without wheezes or rales.  Heart: Tachycardiac, irregular irregular. No S3 or S4.  No murmur, no rubs, or gallops appreciated. Abdomen: Soft, non-tender, non-distended with normoactive bowel sounds. No hepatomegaly. No rebound/guarding. No obvious abdominal masses. Msk:  Strength and tone appear normal for age. No joint deformities or effusions. Extremities: No clubbing or cyanosis. No lower extremity edema.  Distal pedal pulses are 2+ bilaterally. Neuro: Alert and oriented X 3. Moves all extremities spontaneously. No focal deficits noted. Psych:  Responds to questions appropriately with a normal affect. Skin: No rashes or lesions noted  Wt Readings from Last 3 Encounters:  01/27/17 (!) 434 lb (196.9 kg)  01/13/17 (!) 431 lb (195.5 kg)  01/07/17 (!) 414 lb 7.4 oz (188 kg)     Studies/Labs Reviewed:   EKG:  EKG is ordered today.  The ekg ordered today demonstrates atrial flutter, HR 108.   Recent Labs: 01/07/2017: Magnesium 1.8; TSH 2.350 01/08/2017: Hemoglobin 13.4 01/13/2017: ALT 30; BUN 8; Creatinine, Ser 0.76; Platelets 254; Potassium 5.1; Sodium 142   Lipid Panel    Component Value Date/Time   CHOL 176 08/08/2015 1348   TRIG 96 08/08/2015 1348   HDL 43 08/08/2015 1348   CHOLHDL 4.1 08/08/2015 1348    VLDL 19 08/08/2015 1348   LDLCALC 114 08/08/2015 1348    Additional studies/ records that were reviewed today include:   Echocardiogram: 01/08/2017 Study Conclusions  - Left ventricle: The cavity size was normal. Wall thickness was   increased in a pattern of mild LVH. Systolic function was normal.   The estimated ejection fraction was in the range of 55% to 60%.   Indeterminant diastolic function (atrial fibrillation). Although   no diagnostic regional wall motion abnormality was identified,   this possibility cannot be completely excluded on the basis of   this study. - Ventricular septum: Mildly D-shaped interventricular septum,   suggestive of RV pressure/volume overload. - Aortic valve: There was no stenosis. - Mitral valve: There was no significant regurgitation. - Right ventricle: The cavity size was mildly to moderately   dilated. Systolic function was normal. - Right atrium: The  atrium was mildly dilated. - Atrial septum: Atrial septal aneurysm noted. - Pulmonary arteries: No complete TR doppler jet so unable to   estimate PA systolic pressure. - Systemic veins: IVC was not visualized.  Impressions:  - Technically difficult study with poor acoustic windows. Normal LV   size with mild LV hypertrophy. EF 55-60%. Mildly D-shaped   interventricular septum is suggestive of a degree of RV   pressure/volume overload. Mild to moderate RV dilation with   normal systolic function. Unable to estimate PA systolic pressure   on this study.  Assessment:    1. Paroxysmal atrial fibrillation (HCC)   2. Pre-operative laboratory examination   3. Current use of long term anticoagulation   4. OSA (obstructive sleep apnea)   5. Morbid obesity (HCC)      Plan:   In order of problems listed above:  1. Paroxysmal Atrial Flutter/ Use of Long-term Anticoagulation - admitted from 4/24 - 01/09/2017 for atrial flutter with RVR in the setting of recovering of a viral illness. TSH,  Mg, and electrolytes were within normal limits. Echocardiogram showed a preserved EF of 55-60% with no significant valve abnormalities. He was started on Xarelto with plans for a DCCV in 3 weeks is he remained in atrial flutter. - he remains in atrial flutter today by EKG and examination.  - This patients CHA2DS2-VASc Score and unadjusted Ischemic Stroke Rate (% per year) is equal to 0.6 % stroke rate/year from a score of 1 (HTN). Currently in Xarelto with plans for an upcoming DCCV. Reports good compliance with his medication regimen. Has been on Xarelto for 3 weeks as of tomorrow and denies missing any doses. No evidence of active bleeding. - will arrange for DCCV on Thursday. Risks and benefits reviewed with the patient and he agrees to proceed. Will schedule with Dr. Eden Emms as the patient wishes to have the procedure this week and Dr. Rennis Golden will not be in the hospital. He has a spinal stimulator in place and was informed to cut this off prior to the procedure and to bring his control for this with him to the hospital.  - switch from Cardizem CD 240mg  daily to Lopressor 50mg  BID to see if this assists with his severe itching (symptoms started after being started on Cardizem and Xarelto - unlikely side-effect of Xarelto and only a 2% chance of this with Cardizem). If no improvement with the medication adjustment, he should follow-up with his PCP.   2. OSA - Reports good compliance with his CPAP.  3. Morbid Obesity - BMI of 58.86 - importance of diet and exercise reviewed with the patient.    Medication Adjustments/Labs and Tests Ordered: Current medicines are reviewed at length with the patient today.  Concerns regarding medicines are outlined above.  Medication changes, Labs and Tests ordered today are listed in the Patient Instructions below. Patient Instructions  Medication Instructions:  STOP- Diltiazem START- Metoprolol 50 mg twice a day  Labwork: Pre- Op Lab  Testing/Procedures: Your  physician has recommended that you have a Cardioversion (DCCV). Electrical Cardioversion uses a jolt of electricity to your heart either through paddles or wired patches attached to your chest. This is a controlled, usually prescheduled, procedure. Defibrillation is done under light anesthesia in the hospital, and you usually go home the day of the procedure. This is done to get your heart back into a normal rhythm. You are not awake for the procedure. Please see the instruction sheet given to you today.  Follow-Up: Your  physician recommends that you schedule a follow-up appointment in: 2 weeks after Cardioversion with Dr Rennis Golden  Any Other Special Instructions Will Be Listed Below (If Applicable).  If you need a refill on your cardiac medications before your next appointment, please call your pharmacy.   Signed, Ellsworth Lennox, PA-C  01/27/2017 1:27 PM    [facility] [address] [place], [address] [place], [place]  [address] Phone: [phone]; Fax: [phone]  [address] [place], [place] [address] Phone: [phone]

## 2017-01-30 NOTE — Anesthesia Procedure Notes (Signed)
Date/Time: 01/30/2017 10:21 AM Performed by: Virgel GessHOLTZMAN, Gaylord Seydel LEFFEW Pre-anesthesia Checklist: Patient identified, Emergency Drugs available, Suction available, Timeout performed and Patient being monitored Patient Re-evaluated:Patient Re-evaluated prior to inductionOxygen Delivery Method: Ambu bag Placement Confirmation: positive ETCO2

## 2017-01-30 NOTE — Anesthesia Preprocedure Evaluation (Signed)
Anesthesia Evaluation  Patient identified by MRN, date of birth, ID band Patient awake    Reviewed: Allergy & Precautions, NPO status , Patient's Chart, lab work & pertinent test results  Airway Mallampati: III  TM Distance: >3 FB     Dental  (+) Teeth Intact   Pulmonary     + decreased breath sounds      Cardiovascular hypertension,  Rhythm:Irregular Rate:Tachycardia     Neuro/Psych    GI/Hepatic   Endo/Other    Renal/GU      Musculoskeletal   Abdominal (+) + obese,   Peds  Hematology   Anesthesia Other Findings   Reproductive/Obstetrics                             Anesthesia Physical Anesthesia Plan  ASA: III  Anesthesia Plan: General   Post-op Pain Management:    Induction: Intravenous  Airway Management Planned: Mask  Additional Equipment:   Intra-op Plan:   Post-operative Plan:   Informed Consent: I have reviewed the patients History and Physical, chart, labs and discussed the procedure including the risks, benefits and alternatives for the proposed anesthesia with the patient or authorized representative who has indicated his/her understanding and acceptance.     Plan Discussed with: Anesthesiologist and CRNA  Anesthesia Plan Comments:         Anesthesia Quick Evaluation

## 2017-01-30 NOTE — Interval H&P Note (Signed)
History and Physical Interval Note:  01/30/2017 9:51 AM  Charles BergerAlton D Barnett  has presented today for surgery, with the diagnosis of AFIB  The various methods of treatment have been discussed with the patient and family. After consideration of risks, benefits and other options for treatment, the patient has consented to  Procedure(s): CARDIOVERSION (N/A) as a surgical intervention .  The patient's history has been reviewed, patient examined, no change in status, stable for surgery.  I have reviewed the patient's chart and labs.  Questions were answered to the patient's satisfaction.     Charlton HawsPeter Jerrit Horen

## 2017-01-30 NOTE — Transfer of Care (Signed)
Immediate Anesthesia Transfer of Care Note  Patient: Charles Barnett  Procedure(s) Performed: Procedure(s): CARDIOVERSION (N/A)  Patient Location: Endoscopy Unit  Anesthesia Type:General  Level of Consciousness: awake, alert , oriented, patient cooperative and responds to stimulation  Airway & Oxygen Therapy: Patient Spontanous Breathing and Patient connected to face mask oxygen  Post-op Assessment: Report given to RN, Post -op Vital signs reviewed and stable and Patient moving all extremities X 4  Post vital signs: Reviewed and stable  Last Vitals:  Vitals:   01/30/17 1035 01/30/17 1036  BP:    Pulse: 87 84  Resp: 15 15  Temp:      Last Pain:  Vitals:   01/30/17 0935  TempSrc: Oral  PainSc: 5       Patients Stated Pain Goal: 2 (01/30/17 0935)  Complications: No apparent anesthesia complications

## 2017-01-30 NOTE — Discharge Instructions (Signed)
Electrical Cardioversion, Care After °This sheet gives you information about how to care for yourself after your procedure. Your health care provider may also give you more specific instructions. If you have problems or questions, contact your health care provider. °What can I expect after the procedure? °After the procedure, it is common to have: °· Some redness on the skin where the shocks were given. °Follow these instructions at home: °· Do not drive for 24 hours if you were given a medicine to help you relax (sedative). °· Take over-the-counter and prescription medicines only as told by your health care provider. °· Ask your health care provider how to check your pulse. Check it often. °· Rest for 48 hours after the procedure or as told by your health care provider. °· Avoid or limit your caffeine use as told by your health care provider. °Contact a health care provider if: °· You feel like your heart is beating too quickly or your pulse is not regular. °· You have a serious muscle cramp that does not go away. °Get help right away if: °· You have discomfort in your chest. °· You are dizzy or you feel faint. °· You have trouble breathing or you are short of breath. °· Your speech is slurred. °· You have trouble moving an arm or leg on one side of your body. °· Your fingers or toes turn cold or blue. °This information is not intended to replace advice given to you by your health care provider. Make sure you discuss any questions you have with your health care provider. °Document Released: 06/23/2013 Document Revised: 04/05/2016 Document Reviewed: 03/08/2016 °Elsevier Interactive Patient Education © 2017 [employer] ° °

## 2017-01-31 ENCOUNTER — Other Ambulatory Visit: Payer: Self-pay | Admitting: Family Medicine

## 2017-01-31 DIAGNOSIS — E213 Hyperparathyroidism, unspecified: Secondary | ICD-10-CM

## 2017-01-31 NOTE — Anesthesia Postprocedure Evaluation (Addendum)
Anesthesia Post Note  Patient: Charles Barnett  Procedure(s) Performed: Procedure(s) (LRB): CARDIOVERSION (N/A)  Patient location during evaluation: PACU Anesthesia Type: General Level of consciousness: awake, awake and alert and oriented Pain management: pain level controlled Vital Signs Assessment: post-procedure vital signs reviewed and stable Respiratory status: spontaneous breathing, nonlabored ventilation and respiratory function stable Cardiovascular status: blood pressure returned to baseline       Last Vitals:  Vitals:   01/30/17 1040 01/30/17 1050  BP: (!) 111/94 121/82  Pulse: 85 83  Resp: (!) 24 15  Temp: 36.8 C     Last Pain:  Vitals:   01/30/17 1040  TempSrc: Oral  PainSc:                  [name]

## 2017-02-04 ENCOUNTER — Ambulatory Visit (INDEPENDENT_AMBULATORY_CARE_PROVIDER_SITE_OTHER): Payer: 59 | Admitting: Physician Assistant

## 2017-02-04 ENCOUNTER — Encounter: Payer: Self-pay | Admitting: Physician Assistant

## 2017-02-04 NOTE — Progress Notes (Signed)
PRIMARY CARE AT [facility] [address] [place] [address] [phone]  Date:  02/04/2017   Name:  Charles Barnett   DOB:  1965-10-01   MRN:  [id number]  PCP:  Joretta Bachelor, PA    History of Present Illness:  Charles Barnett is a 51 y.o. male patient who presents to PCP with  Chief Complaint  Patient presents with  . Follow-up    not sure why      Patient is here for recheck of serum calcium and evaluation of these findings.  When he was hospitalized for atrial fibrillation, PTH was obtained after a finding of hypercalcemia.  He was then advised to follow up here.    Wt Readings from Last 3 Encounters:  02/04/17 (!) 442 lb (200.5 kg)  01/27/17 (!) 434 lb (196.9 kg)  01/13/17 (!) 431 lb (195.5 kg)    Patient Active Problem List   Diagnosis Date Noted  . Chronic pain syndrome 01/30/2017  . Lumbosacral spondylosis 01/30/2017  . Persistent atrial fibrillation ([place])   . Hypercalcemia 01/09/2017  . Atrial flutter ([place]) 01/07/2017  . Left shoulder pain 10/18/2016  . Right knee pain 10/18/2016  . Apnea, sleep 01/06/2014  . Cannot sleep 10/01/2013  . Body mass index (BMI) of 50-59.9 in adult ([place]) 06/25/2013  . Postoperative back pain 05/27/2013  . BP (high blood pressure) 05/27/2013  . H/O disease 03/22/2013    Past Medical History:  Diagnosis Date  . Arthritis   . Atrial flutter ([place]) 12/2016  . Hypertension   . Neuromuscular disorder ([place])   . Sleep apnea     Past Surgical History:  Procedure Laterality Date  . CARDIOVERSION N/A 01/30/2017   Procedure: CARDIOVERSION;  Surgeon: Josue Hector, MD;  Location: [facility] ENDOSCOPY;  Service: Cardiovascular;  Laterality: N/A;  . SPINE SURGERY      Social History  Substance Use Topics  . Smoking status: Never Smoker  . Smokeless tobacco: Never Used  . Alcohol use No    Family History  Problem Relation Age of Onset  . Diabetes Mother   . Hyperlipidemia Mother   . Hypertension Mother   . Heart disease  Mother   . Diabetes Sister   . Hypertension Sister   . Heart disease Sister     Allergies  Allergen Reactions  . Ibuprofen Other (See Comments)    "bloody stools"  . Iodine Swelling    Contrast dye  . Iodinated Diagnostic Agents Swelling    Medication list has been reviewed and updated.  Current Outpatient Prescriptions on File Prior to Visit  Medication Sig Dispense Refill  . cetirizine (ZYRTEC) 10 MG tablet Take 1 tablet (10 mg total) by mouth daily. 30 tablet 1  . metoprolol (LOPRESSOR) 50 MG tablet Take 1 tablet (50 mg total) by mouth 2 (two) times daily. 60 tablet 6  . oxyCODONE-acetaminophen (PERCOCET) 10-325 MG tablet Take 1 tablet by mouth every 6 (six) hours as needed for pain.     . OXYCONTIN 20 MG 12 hr tablet Take 1 tablet by mouth 2 (two) times daily.  0  . rivaroxaban (XARELTO) 20 MG TABS tablet Take 1 tablet (20 mg total) by mouth daily with supper. 30 tablet 0  . tiZANidine (ZANAFLEX) 4 MG tablet Take 4 mg by mouth 3 (three) times daily as needed for muscle spasms.      No current facility-administered medications on file prior to visit.     ROS ROS otherwise unremarkable unless listed  above.  Physical Examination: BP (!) 154/89   Pulse 71   Temp 98.2 F (36.8 C) (Oral)   Resp 17   Ht 6' (1.829 m)   Wt (!) 442 lb (200.5 kg)   SpO2 98%   BMI 59.95 kg/m  Ideal Body Weight: Weight in (lb) to have BMI = 25: 183.9  Physical Exam  Constitutional: He is oriented to person, place, and time. He appears well-developed and well-nourished. No distress.  HENT:  Head: Normocephalic and atraumatic.  Eyes: Conjunctivae and EOM are normal. Pupils are equal, round, and reactive to light.  Cardiovascular: Normal rate, regular rhythm, normal heart sounds and intact distal pulses.  Exam reveals no friction rub.   No murmur heard. Pulmonary/Chest: Effort normal. No accessory muscle usage. No apnea. No respiratory distress. He has no wheezes. He has no rales.   Neurological: He is alert and oriented to person, place, and time.  Skin: Skin is warm and dry. He is not diaphoretic.  Psychiatric: He has a normal mood and affect. His behavior is normal.    Assessment and Plan: Charles Barnett is a 51 y.o. male who is here today for elevated serum calcium --obtaining calcium and rechecking pth at this time.    Serum calcium elevated - Plan: CMP14+EGFR, CBC with Differential/Platelet, PTH, Intact and Calcium, VITAMIN D 25 Hydroxy (Vit-D Deficiency, Fractures), CANCELED: Basic metabolic panel  Ivar Drape, PA-C [facility] 5/24/20181:51 PM

## 2017-02-04 NOTE — Patient Instructions (Addendum)
You are awaiting contact for endocrinology visit.   I am rechecking the parathyroid hormone at this time.      IF you received an x-ray today, you will receive an invoice from [facility]. Please contact [facility] at [phone] with questions or concerns regarding your invoice.   IF you received labwork today, you will receive an invoice from [facility]. Please contact [facility] at [phone] with questions or concerns regarding your invoice.   Our billing staff will not be able to assist you with questions regarding bills from these companies.  You will be contacted with the lab results as soon as they are available. The fastest way to get your results is to activate your My Chart account. Instructions are located on the last page of this paperwork. If you have not heard from us regarding the results in 2 weeks, please contact this office.

## 2017-02-05 ENCOUNTER — Encounter: Payer: Self-pay | Admitting: Physician Assistant

## 2017-02-05 DIAGNOSIS — Z79899 Other long term (current) drug therapy: Secondary | ICD-10-CM | POA: Insufficient documentation

## 2017-02-05 LAB — CMP14+EGFR
ALBUMIN: 4.1 g/dL (ref 3.5–5.5)
ALT: 42 IU/L (ref 0–44)
AST: 36 IU/L (ref 0–40)
Albumin/Globulin Ratio: 1.6 (ref 1.2–2.2)
Alkaline Phosphatase: 69 IU/L (ref 39–117)
BILIRUBIN TOTAL: 0.4 mg/dL (ref 0.0–1.2)
BUN / CREAT RATIO: 8 — AB (ref 9–20)
BUN: 7 mg/dL (ref 6–24)
CO2: 23 mmol/L (ref 18–29)
CREATININE: 0.93 mg/dL (ref 0.76–1.27)
Calcium: 10.5 mg/dL — ABNORMAL HIGH (ref 8.7–10.2)
Chloride: 102 mmol/L (ref 96–106)
GFR calc Af Amer: 109 mL/min/{1.73_m2} (ref >59)
GFR, EST NON AFRICAN AMERICAN: 95 mL/min/{1.73_m2} (ref >59)
GLUCOSE: 94 mg/dL (ref 65–99)
Globulin, Total: 2.5 g/dL (ref 1.5–4.5)
Potassium: 4.5 mmol/L (ref 3.5–5.2)
Sodium: 140 mmol/L (ref 134–144)
TOTAL PROTEIN: 6.6 g/dL (ref 6.0–8.5)

## 2017-02-05 LAB — CBC WITH DIFFERENTIAL/PLATELET
BASOS ABS: 0 10*3/uL (ref 0.0–0.2)
BASOS: 0 %
EOS (ABSOLUTE): 0.3 10*3/uL (ref 0.0–0.4)
EOS: 3 %
HEMATOCRIT: 40.1 % (ref 37.5–51.0)
HEMOGLOBIN: 12.7 g/dL — AB (ref 13.0–17.7)
IMMATURE GRANS (ABS): 0 10*3/uL (ref 0.0–0.1)
Immature Granulocytes: 0 %
LYMPHS ABS: 2.4 10*3/uL (ref 0.7–3.1)
LYMPHS: 26 %
MCH: 27.3 pg (ref 26.6–33.0)
MCHC: 31.7 g/dL (ref 31.5–35.7)
MCV: 86 fL (ref 79–97)
MONOCYTES: 13 %
Monocytes Absolute: 1.2 10*3/uL — ABNORMAL HIGH (ref 0.1–0.9)
Neutrophils Absolute: 5.5 10*3/uL (ref 1.4–7.0)
Neutrophils: 58 %
Platelets: 230 10*3/uL (ref 150–379)
RBC: 4.65 x10E6/uL (ref 4.14–5.80)
RDW: 14.4 % (ref 12.3–15.4)
WBC: 9.5 10*3/uL (ref 3.4–10.8)

## 2017-02-05 LAB — VITAMIN D 25 HYDROXY (VIT D DEFICIENCY, FRACTURES): VIT D 25 HYDROXY: 14.6 ng/mL — AB (ref 30.0–100.0)

## 2017-02-05 LAB — PTH, INTACT AND CALCIUM

## 2017-02-07 ENCOUNTER — Other Ambulatory Visit: Payer: Self-pay | Admitting: Pharmacist

## 2017-02-07 MED ORDER — RIVAROXABAN 20 MG PO TABS: 20.0000 mg | ORAL_TABLET | Freq: Every day | ORAL | 5 refills | Status: DC

## 2017-02-07 MED FILL — XARELTO 20 MG TABLET: 20 | 30 days supply | Qty: 30 | Fill #0

## 2017-02-10 ENCOUNTER — Telehealth: Payer: Self-pay | Admitting: Physician Assistant

## 2017-02-10 NOTE — Telephone Encounter (Signed)
Did the pth come through for this lab?

## 2017-02-11 NOTE — Telephone Encounter (Signed)
To lab to check on

## 2017-02-12 ENCOUNTER — Ambulatory Visit (HOSPITAL_BASED_OUTPATIENT_CLINIC_OR_DEPARTMENT_OTHER)
Admission: RE | Admit: 2017-02-12 | Discharge: 2017-02-12 | Disposition: A | Discharge status: Home / Self Care | Payer: 59 | Source: Ambulatory Visit | Attending: Physician Assistant | Admitting: Physician Assistant

## 2017-02-12 ENCOUNTER — Other Ambulatory Visit (HOSPITAL_BASED_OUTPATIENT_CLINIC_OR_DEPARTMENT_OTHER): Payer: Self-pay | Admitting: Physician Assistant

## 2017-02-12 ENCOUNTER — Other Ambulatory Visit: Payer: 59 | Admitting: Family Medicine

## 2017-02-12 DIAGNOSIS — M549 Dorsalgia, unspecified: Secondary | ICD-10-CM | POA: Diagnosis not present

## 2017-02-12 DIAGNOSIS — M545 Low back pain: Secondary | ICD-10-CM | POA: Diagnosis not present

## 2017-02-12 DIAGNOSIS — G894 Chronic pain syndrome: Secondary | ICD-10-CM

## 2017-02-12 DIAGNOSIS — M5136 Other intervertebral disc degeneration, lumbar region: Secondary | ICD-10-CM | POA: Diagnosis not present

## 2017-02-12 DIAGNOSIS — M47817 Spondylosis without myelopathy or radiculopathy, lumbosacral region: Secondary | ICD-10-CM | POA: Diagnosis not present

## 2017-02-12 DIAGNOSIS — G8929 Other chronic pain: Secondary | ICD-10-CM

## 2017-02-12 NOTE — Progress Notes (Signed)
Back today for additional blood test note run at last visit PTH?

## 2017-02-12 NOTE — Telephone Encounter (Signed)
Redrawn today

## 2017-02-13 ENCOUNTER — Encounter: Payer: Self-pay | Admitting: Internal Medicine

## 2017-02-13 ENCOUNTER — Ambulatory Visit (INDEPENDENT_AMBULATORY_CARE_PROVIDER_SITE_OTHER): Payer: 59 | Admitting: Internal Medicine

## 2017-02-13 VITALS — BP 156/98 | HR 62 | Ht 72.0 in | Wt >= 6400 oz

## 2017-02-13 DIAGNOSIS — I4892 Unspecified atrial flutter: Secondary | ICD-10-CM | POA: Diagnosis not present

## 2017-02-13 DIAGNOSIS — G4733 Obstructive sleep apnea (adult) (pediatric): Secondary | ICD-10-CM | POA: Diagnosis not present

## 2017-02-13 DIAGNOSIS — I10 Essential (primary) hypertension: Secondary | ICD-10-CM

## 2017-02-13 LAB — PTH, INTACT AND CALCIUM
Calcium: 10.6 mg/dL — ABNORMAL HIGH (ref 8.7–10.2)
PTH: 80 pg/mL — AB (ref 15–65)

## 2017-02-13 MED ORDER — LOSARTAN POTASSIUM 50 MG PO TABS: 50.0000 mg | ORAL_TABLET | Freq: Every day | ORAL | 3 refills | Status: DC

## 2017-02-13 MED FILL — LOSARTAN POTASSIUM 50 MG TA: 50 | 90 days supply | Qty: 90 | Fill #0

## 2017-02-13 NOTE — Progress Notes (Signed)
OFFICE FOLLOW-UP NOTE  Chief Complaint:  Hospital follow-up  Primary Care Physician: Garnetta Buddy, [place]  HPI:  Charles Barnett is a 51 y.o. male with a past medial history significant for morbid obesity, obstructive sleep apnea on CPAP, arthritis, hypertension and neuromuscular disorder and chronic low back pain on narcotics and status post spinal stimulator. He reports a one-week history of chest pain and cough, but in retrospect he's had fatigue and shortness of breath for several weeks. He presented to [facility] urgent care today for evaluation and he was found to have atrial flutter with rapid ventricular response. EMS was called to urgent care where he was evaluated and given 5 mg of IV Cardizem. Initial lab work in the ER was unremarkable. Initial troponin was negative. Chest x-ray shows mild cardiomegaly and mild interstitial prominence, probably suggestive of heart failure. He was placed on by mouth Cardizem and Xarelto and discharged with plan for outpatient cardioversion. He saw [place], PA-C, as an outpatient and was switched from Cardizem to Lopressor (there was a possible Cardizem side effect). LVEF was normal on echo. He underwent successful DC cardioversion and is maintaining sinus rhythm. He says he does report some energy increased. Blood pressure remains elevated however today. He was placed on losartan HCTZ which was discontinued. He was also noted recently to have elevated parathyroid hormone and hypercalcemia. He scheduled to see Dr. Everardo All with endocrinology.  PMHx:  Past Medical History:  Diagnosis Date  . Arthritis   . Atrial flutter (HCC) 12/2016  . Hypertension   . Neuromuscular disorder (HCC)   . Sleep apnea     Past Surgical History:  Procedure Laterality Date  . CARDIOVERSION N/A 01/30/2017   Procedure: CARDIOVERSION;  Surgeon: Wendall Stade, MD;  Location: [facility] ENDOSCOPY;  Service: Cardiovascular;  Laterality: N/A;  . SPINE SURGERY       FAMHx:  Family History  Problem Relation Age of Onset  . Diabetes Mother   . Hyperlipidemia Mother   . Hypertension Mother   . Heart disease Mother   . Diabetes Sister   . Hypertension Sister   . Heart disease Sister     SOCHx:   reports that he has never smoked. He has never used smokeless tobacco. He reports that he does not drink alcohol or use drugs.  ALLERGIES:  Allergies  Allergen Reactions  . Ibuprofen Other (See Comments)    "bloody stools"  . Iodine Swelling    Contrast dye  . Iodinated Diagnostic Agents Swelling    ROS: Pertinent items noted in HPI and remainder of comprehensive ROS otherwise negative.  HOME MEDS: Current Outpatient Prescriptions on File Prior to Visit  Medication Sig Dispense Refill  . cetirizine (ZYRTEC) 10 MG tablet Take 1 tablet (10 mg total) by mouth daily. 30 tablet 1  . metoprolol (LOPRESSOR) 50 MG tablet Take 1 tablet (50 mg total) by mouth 2 (two) times daily. 60 tablet 6  . oxyCODONE-acetaminophen (PERCOCET) 10-325 MG tablet Take 1 tablet by mouth every 6 (six) hours as needed for pain.     . OXYCONTIN 20 MG 12 hr tablet Take 1 tablet by mouth 2 (two) times daily.  0  . rivaroxaban (XARELTO) 20 MG TABS tablet Take 1 tablet (20 mg total) by mouth daily with supper. 30 tablet 5  . tiZANidine (ZANAFLEX) 4 MG tablet Take 4 mg by mouth 3 (three) times daily as needed for muscle spasms.      No current facility-administered medications on file  prior to visit.     LABS/IMAGING: Results for orders placed or performed in visit on 02/12/17 (from the past 48 hour(s))  PTH, intact and calcium     Status: Abnormal   Collection Time: 02/12/17  9:09 AM  Result Value Ref Range   Calcium 10.6 (H) 8.7 - 10.2 mg/dL   PTH 80 (H) 15 - 65 pg/mL   PTH Comment     Comment: Interpretation                 Intact PTH    Calcium                                 (pg/mL)      (mg/dL) Normal                          15 - 65     8.6 - 10.2 Primary  Hyperparathyroidism         >65          >10.2 Secondary Hyperparathyroidism       >65          <10.2 Non-Parathyroid Hypercalcemia       <65          >10.2 Hypoparathyroidism                  <15          < 8.6 Non-Parathyroid Hypocalcemia    15 - 65          < 8.6    Dg Lumbar Spine 2-3 Views  Result Date: 02/12/2017 CLINICAL DATA:  Chronic low back pain greatest on the right. Patient has had spinal cord stimulator present for 10 years. EXAM: LUMBAR SPINE - 2-3 VIEW COMPARISON:  Lumbar spine CT scan dated June 10, 2013 FINDINGS: The lumbar vertebral bodies are numbered in accordance with the CT scan with the lower most lumbar type vertebral body being designated L5. The lumbar vertebral bodies are preserved in height. There is mild disc space narrowing at L4-5. There is no spondylolisthesis. There is mild facet joint hypertrophy at L 4-5. The pedicles and transverse processes are intact. The twelfth ribs are hypoplastic. A spinal cord stimulator is present with the tip terminating at the T11 level or above the superior margin of the images. IMPRESSION: Mild degenerative disc space narrowing at L4-5. No acute lumbar spine abnormality. Electronically Signed   By: David  [place] M.D.   On: 02/12/2017 08:41    LIPID PANEL:    Component Value Date/Time   CHOL 176 08/08/2015 1348   TRIG 96 08/08/2015 1348   HDL 43 08/08/2015 1348   CHOLHDL 4.1 08/08/2015 1348   VLDL 19 08/08/2015 1348   LDLCALC 114 08/08/2015 1348     WEIGHTS: Wt Readings from Last 3 Encounters:  02/13/17 (!) 430 lb 3.2 oz (195.1 kg)  02/04/17 (!) 442 lb (200.5 kg)  01/27/17 (!) 434 lb (196.9 kg)    VITALS: BP (!) 156/98   Pulse 62   Ht 6' (1.829 m)   Wt (!) 430 lb 3.2 oz (195.1 kg)   BMI 58.35 kg/m   EXAM: General appearance: alert, no distress and morbidly obese Neck: no carotid bruit and no JVD Lungs: clear to auscultation bilaterally Heart: regular rate and rhythm Abdomen: soft, non-tender; bowel sounds  normal; no masses,  no organomegaly Extremities: extremities normal,  atraumatic, no cyanosis or edema Pulses: 2+ and symmetric Skin: Skin color, texture, turgor normal. No rashes or lesions Neurologic: Grossly normal Psych: Pleasant  EKG: Sinus rhythm with first-degree AV block at 62, incomplete right bundle branch block, nonspecific ST and T-wave changes - personally reviewed  ASSESSMENT: 1. Recent paroxysmal atrial flutter status post DC CV-CHADSVASC score of 1 2. Hypertension-uncontrolled 3. Morbid obesity 4. Obstructive sleep apnea on CPAP  PLAN: 1.   Mr. Markgraf is now back in sinus rhythm and does note improvement in his energy. We discussed the possibility of either using aspirin therapy or ongoing anticoagulation with Xarelto given his CHADSVASC score of 1. Given the fact that he is significantly obese I'm concerned that aspirin may not provide enough cardioprotection for him. I would advise he remain on Xarelto long-term. Blood pressure still not at goal we'll add back 50 mg of losartan to his current regimen. I encouraged continued use of CPAP. Also encourage exercise and weight loss and discussed some healthier eating habits with him.  Follow-up annually sooner as necessary.  Chrystie Nose, MD, [facility]  [place]  [facility]  Attending Cardiologist  Direct Dial: [phone]  Fax: [phone]  Website:  [email or web address] Nicely Hilty 02/13/2017, 10:43 AM

## 2017-02-13 NOTE — Patient Instructions (Addendum)
Your physician has recommended you make the following change in your medication:  -- START losartan 50mg  once daily   Your physician wants you to follow-up in: ONE YEAR with Dr. Rennis GoldenHilty. You will receive a reminder letter in the mail two months in advance. If you don't receive a letter, please call our office to schedule the follow-up appointment.

## 2017-02-17 DIAGNOSIS — M25519 Pain in unspecified shoulder: Secondary | ICD-10-CM | POA: Diagnosis not present

## 2017-02-17 DIAGNOSIS — M25569 Pain in unspecified knee: Secondary | ICD-10-CM | POA: Diagnosis not present

## 2017-02-17 DIAGNOSIS — G894 Chronic pain syndrome: Secondary | ICD-10-CM | POA: Diagnosis not present

## 2017-02-17 DIAGNOSIS — M47817 Spondylosis without myelopathy or radiculopathy, lumbosacral region: Secondary | ICD-10-CM | POA: Diagnosis not present

## 2017-02-18 ENCOUNTER — Other Ambulatory Visit: Payer: Self-pay | Admitting: Physician Assistant

## 2017-02-18 MED ORDER — ERGOCALCIFEROL 1.25 MG (50000 UT) PO CAPS
50000.0000 [IU] | ORAL_CAPSULE | ORAL | 0 refills | Status: DC
Start: 2017-02-18 — End: 2017-03-26

## 2017-02-18 MED FILL — VIT D2 1.25 MG (50,000 UNIT: 1.25 MG | 56 days supply | Qty: 8 | Fill #0

## 2017-02-18 NOTE — Addendum Note (Signed)
Addended by: Beryle QuantBENSHINE, TARA L on: 02/18/2017 11:14 AM   Modules accepted: Orders

## 2017-02-19 MED FILL — oxyCODONE HCL ER 20 MG T12A: 20 | 30 days supply | Qty: 60 | Fill #0

## 2017-02-19 MED FILL — OXYCODONE-ACETAMINOPHEN 10-: 10-325 | 30 days supply | Qty: 120 | Fill #0

## 2017-02-20 DIAGNOSIS — R0902 Hypoxemia: Secondary | ICD-10-CM | POA: Diagnosis not present

## 2017-02-20 DIAGNOSIS — G4733 Obstructive sleep apnea (adult) (pediatric): Secondary | ICD-10-CM | POA: Diagnosis not present

## 2017-02-28 MED FILL — METOPROLOL TARTRATE 50 MG T: 50 | 30 days supply | Qty: 60 | Fill #1

## 2017-03-10 MED FILL — XARELTO 20 MG TABLET: 20 | 30 days supply | Qty: 30 | Fill #1

## 2017-03-22 DIAGNOSIS — G4733 Obstructive sleep apnea (adult) (pediatric): Secondary | ICD-10-CM | POA: Diagnosis not present

## 2017-03-22 DIAGNOSIS — R0902 Hypoxemia: Secondary | ICD-10-CM | POA: Diagnosis not present

## 2017-03-24 DIAGNOSIS — M25519 Pain in unspecified shoulder: Secondary | ICD-10-CM | POA: Diagnosis not present

## 2017-03-24 DIAGNOSIS — M47817 Spondylosis without myelopathy or radiculopathy, lumbosacral region: Secondary | ICD-10-CM | POA: Diagnosis not present

## 2017-03-24 DIAGNOSIS — M25569 Pain in unspecified knee: Secondary | ICD-10-CM | POA: Diagnosis not present

## 2017-03-24 DIAGNOSIS — G894 Chronic pain syndrome: Secondary | ICD-10-CM | POA: Diagnosis not present

## 2017-03-24 MED FILL — OXYCODONE-ACETAMINOPHEN 10-: 10-325 | 30 days supply | Qty: 120 | Fill #0

## 2017-03-24 MED FILL — oxyCODONE HCL ER 20 MG T12A: 20 | 30 days supply | Qty: 60 | Fill #0

## 2017-03-26 ENCOUNTER — Encounter: Payer: Self-pay | Admitting: Endocrinology

## 2017-03-26 ENCOUNTER — Ambulatory Visit (INDEPENDENT_AMBULATORY_CARE_PROVIDER_SITE_OTHER): Payer: 59 | Admitting: Endocrinology

## 2017-03-26 LAB — VITAMIN D 25 HYDROXY (VIT D DEFICIENCY, FRACTURES): VITD: 17.17 ng/mL — AB (ref 30.00–100.00)

## 2017-03-26 MED ORDER — ERGOCALCIFEROL 1.25 MG (50000 UT) PO CAPS: 50000.0000 [IU] | ORAL_CAPSULE | ORAL | 0 refills | Status: DC

## 2017-03-26 NOTE — Patient Instructions (Signed)
blood tests are requested for you today.  We'll let you know about the results. Based on the results, we will probably find that you have a borderline overactive parathyroid, which needs no treatment now. Please come back for a follow-up appointment in 6 months.

## 2017-03-26 NOTE — Progress Notes (Addendum)
Subjective:    Patient ID: Charles Barnett, male    DOB: 09/19/1965, 51 y.o.   MRN: [id number]  HPI Pt is referred by Trena PlattStephanie English, PA, for hypercalcemia.  Pt was noted to have hypercalcemia in 2016. He has never had osteoporosis, urolithiasis, thyroid probs, sarcoidosis, cancer, PUD, pancreatitis, depression, or bony fracture.  He does not take A supplement.  Pt denies taking antacids, Li++, or HCTZ.  Pt states mild chronic pain at the lower back, and assoc weight gain.   Past Medical History:  Diagnosis Date  . Arthritis   . Atrial flutter (HCC) 12/2016  . Hypertension   . Neuromuscular disorder (HCC)   . Sleep apnea     Past Surgical History:  Procedure Laterality Date  . CARDIOVERSION N/A 01/30/2017   Procedure: CARDIOVERSION;  Surgeon: Wendall StadeNishan, Peter C, MD;  Location[facility] ENDOSCOPY;  Service: Cardiovascular;  Laterality: N/A;  . SPINE SURGERY      Social History   Social History  . Marital status: Married    Spouse name: N/A  . Number of children: 3  . Years of education: N/A   Occupational History  . Not on file.   Social History Main Topics  . Smoking status: Never Smoker  . Smokeless tobacco: Never Used  . Alcohol use No  . Drug use: No  . Sexual activity: Yes    Partners: Female   Other Topics Concern  . Not on file   Social History Narrative  . No narrative on file    Current Outpatient Prescriptions on File Prior to Visit  Medication Sig Dispense Refill  . cetirizine (ZYRTEC) 10 MG tablet Take 1 tablet (10 mg total) by mouth daily. 30 tablet 1  . losartan (COZAAR) 50 MG tablet Take 1 tablet (50 mg total) by mouth daily. 90 tablet 3  . metoprolol (LOPRESSOR) 50 MG tablet Take 1 tablet (50 mg total) by mouth 2 (two) times daily. 60 tablet 6  . oxyCODONE-acetaminophen (PERCOCET) 10-325 MG tablet Take 1 tablet by mouth every 6 (six) hours as needed for pain.     . OXYCONTIN 20 MG 12 hr tablet Take 1 tablet by mouth 2 (two) times daily.  0  .  rivaroxaban (XARELTO) 20 MG TABS tablet Take 1 tablet (20 mg total) by mouth daily with supper. 30 tablet 5  . tiZANidine (ZANAFLEX) 4 MG tablet Take 4 mg by mouth 3 (three) times daily as needed for muscle spasms.      No current facility-administered medications on file prior to visit.     Allergies  Allergen Reactions  . Ibuprofen Other (See Comments)    "bloody stools"  . Iodine Swelling    Contrast dye  . Iodinated Diagnostic Agents Swelling    Family History  Problem Relation Age of Onset  . Diabetes Mother   . Hyperlipidemia Mother   . Hypertension Mother   . Heart disease Mother   . Diabetes Sister   . Hypertension Sister   . Heart disease Sister   . Hyperparathyroidism Neg Hx     BP 122/82   Pulse 78   Ht 6' (1.829 m)   Wt (!) 433 lb (196.4 kg)   SpO2 94%   BMI 58.73 kg/m    Review of Systems Denies gynecomastia, hematuria, memory loss, erectile dysfunction, numbness, abdominal pain, muscle weakness, urinary frequency, hypoglycemia, skin rash, visual loss, sob, diarrhea, rhinorrhea, easy bruising, and depression.  Fatigue is improved.     Objective:  Physical Exam VS: see vs page GEN: no distress HEAD: head: no deformity eyes: no periorbital swelling, no proptosis external nose and ears are normal mouth: no lesion seen NECK: supple, thyroid is not enlarged.  CHEST WALL: no deformity BREASTS: moderate bilat pseudogynecomastia.   LUNGS: clear to auscultation CV: reg rate and rhythm, no murmur ABD: abdomen is soft, nontender.  no hepatosplenomegaly.  not distended.  no hernia MUSCULOSKELETAL: muscle bulk and strength are grossly normal.  no obvious joint swelling.  gait is normal and steady EXTEMITIES: no deformity.  no ulcer on the feet.  feet are of normal color and temp.  1+ bilat leg edema.  There is hyperpigmentation of the legs.   PULSES: dorsalis pedis intact bilat.  no carotid bruit NEURO:  cn 2-12 grossly intact.   readily moves all 4's.   sensation is intact to touch on the feet SKIN:  Normal texture and temperature.  No rash or suspicious lesion is visible.   NODES:  None palpable at the neck PSYCH: alert, well-oriented.  Does not appear anxious nor depressed.   Lab Results  Component Value Date   TSH 1.76 01/27/2017   Lab Results  Component Value Date   PTH 80 (H) 02/12/2017   PTH Comment 02/12/2017   CALCIUM 10.6 (H) 02/12/2017   PHOS 3.2 01/08/2017   Lab Results  Component Value Date   ALT 42 02/04/2017   AST 36 02/04/2017   ALKPHOS 69 02/04/2017   BILITOT 0.4 02/04/2017   25-OH vit-D=17  X-rays:  Mild degenerative disc space narrowing at L4-5. No acute lumbar spine abnormality.  I personally reviewed electrocardiogram tracing (02/13/17)): Indication: h/o AF Impression: NSR, with AVB.  No MI.  No hypertrophy. ND-ST and TW-A   Compared to 01/08/17: AF is resolved     Assessment & Plan:  Hypercalcemia, new, uncertain etiology.   Hyperparathyroidism, prob primary.   Vit-D deficiency: we need to correct this in order to get accurate simultaneous readings of Ca++ and PTH.  I have sent a prescription to your pharmacy, to increase this. Low-back pain: not related to the above. Please come back for a follow-up appointment in 6 weeks.      Patient Instructions  blood tests are requested for you today.  We'll let you know about the results. Based on the results, we will probably find that you have a borderline overactive parathyroid, which needs no treatment now. Please come back for a follow-up appointment in 6 months.

## 2017-03-27 LAB — PTH, INTACT AND CALCIUM
CALCIUM: 10.4 mg/dL — AB (ref 8.6–10.3)
PTH: 124 pg/mL — ABNORMAL HIGH (ref 14–64)

## 2017-03-29 LAB — VITAMIN D 1,25 DIHYDROXY
VITAMIN D 1, 25 (OH) TOTAL: 50 pg/mL (ref 18–72)
Vitamin D2 1, 25 (OH)2: 27 pg/mL
Vitamin D3 1, 25 (OH)2: 23 pg/mL

## 2017-03-29 LAB — VITAMIN A: VITAMIN A (RETINOIC ACID): 36 ug/dL — AB (ref 38–98)

## 2017-03-31 LAB — PTH-RELATED PEPTIDE: PTH-Related Protein (PTH-RP): 16 pg/mL (ref 14–27)

## 2017-03-31 MED FILL — METOPROLOL TARTRATE 50 MG T: 50 | 30 days supply | Qty: 60 | Fill #2

## 2017-04-03 MED FILL — VIT D2 1.25 MG (50,000 UNIT: 1.25 MG | 35 days supply | Qty: 15 | Fill #0

## 2017-04-08 MED FILL — NALOXONE 2 MG/2 ML SYRINGE: 2 | 1 days supply | Qty: 4 | Fill #0

## 2017-04-09 MED FILL — XARELTO 20 MG TABLET: 20 | 30 days supply | Qty: 30 | Fill #2

## 2017-04-22 DIAGNOSIS — G4733 Obstructive sleep apnea (adult) (pediatric): Secondary | ICD-10-CM | POA: Diagnosis not present

## 2017-04-22 DIAGNOSIS — R0902 Hypoxemia: Secondary | ICD-10-CM | POA: Diagnosis not present

## 2017-04-28 MED FILL — METOPROLOL TARTRATE 50 MG T: 50 | 30 days supply | Qty: 60 | Fill #3

## 2017-05-08 ENCOUNTER — Ambulatory Visit (INDEPENDENT_AMBULATORY_CARE_PROVIDER_SITE_OTHER): Payer: 59 | Admitting: Endocrinology

## 2017-05-08 ENCOUNTER — Encounter: Payer: Self-pay | Admitting: Endocrinology

## 2017-05-08 LAB — PHOSPHORUS: Phosphorus: 2.7 mg/dL (ref 2.3–4.6)

## 2017-05-08 LAB — VITAMIN D 25 HYDROXY (VIT D DEFICIENCY, FRACTURES): VITD: 23.87 ng/mL — ABNORMAL LOW (ref 30.00–100.00)

## 2017-05-08 NOTE — Patient Instructions (Addendum)
blood tests are requested for you today.  We'll let you know about the results. With this information, we can tell if the calcium is due to an overactive parathyroid.   If we do find that, and it is mild, all we need to do is to follow it for now.  Other tests we might need to do are the bone-density and 24-HR urine for calcium.  Please come back for a follow-up appointment in 6 months.

## 2017-05-08 NOTE — Progress Notes (Signed)
Subjective:    Patient ID: Charles Barnett, male    DOB: June 26, 1966, 51 y.o.   MRN: [id number]  HPI Pt returns for f/u of hypercalcemia (dx'ed 2016; he has never had osteoporosis, urolithiasis, thyroid probs, sarcoidosis, cancer, PUD, pancreatitis, depression, or bony fracture.  He does not take A supplement; hyperparathyroidism and Vit-D deficiency were found, she he needed vit-D supplementation in order to confirm that elev PTH was primary).  main symptom is chromic bilat knee pain.  Past Medical History:  Diagnosis Date  . Arthritis   . Atrial flutter (HCC) 12/2016  . Hypertension   . Neuromuscular disorder (HCC)   . Sleep apnea     Past Surgical History:  Procedure Laterality Date  . CARDIOVERSION N/A 01/30/2017   Procedure: CARDIOVERSION;  Surgeon: Wendall Stade, MD;  Location: [facility] ENDOSCOPY;  Service: Cardiovascular;  Laterality: N/A;  . SPINE SURGERY      Social History   Social History  . Marital status: Married    Spouse name: N/A  . Number of children: 3  . Years of education: N/A   Occupational History  . Not on file.   Social History Main Topics  . Smoking status: Never Smoker  . Smokeless tobacco: Never Used  . Alcohol use No  . Drug use: No  . Sexual activity: Yes    Partners: Female   Other Topics Concern  . Not on file   Social History Narrative  . No narrative on file    Current Outpatient Prescriptions on File Prior to Visit  Medication Sig Dispense Refill  . cetirizine (ZYRTEC) 10 MG tablet Take 1 tablet (10 mg total) by mouth daily. 30 tablet 1  . losartan (COZAAR) 50 MG tablet Take 1 tablet (50 mg total) by mouth daily. 90 tablet 3  . metoprolol (LOPRESSOR) 50 MG tablet Take 1 tablet (50 mg total) by mouth 2 (two) times daily. 60 tablet 6  . oxyCODONE-acetaminophen (PERCOCET) 10-325 MG tablet Take 1 tablet by mouth every 6 (six) hours as needed for pain.     . OXYCONTIN 20 MG 12 hr tablet Take 1 tablet by mouth 2 (two) times daily.  0  .  rivaroxaban (XARELTO) 20 MG TABS tablet Take 1 tablet (20 mg total) by mouth daily with supper. 30 tablet 5  . tiZANidine (ZANAFLEX) 4 MG tablet Take 4 mg by mouth 3 (three) times daily as needed for muscle spasms.      No current facility-administered medications on file prior to visit.     Allergies  Allergen Reactions  . Ibuprofen Other (See Comments)    "bloody stools"  . Iodine Swelling    Contrast dye  . Iodinated Diagnostic Agents Swelling    Family History  Problem Relation Age of Onset  . Diabetes Mother   . Hyperlipidemia Mother   . Hypertension Mother   . Heart disease Mother   . Diabetes Sister   . Hypertension Sister   . Heart disease Sister   . Hyperparathyroidism Neg Hx     BP (!) 146/88   Pulse 60   Wt (!) 431 lb (195.5 kg)   SpO2 97%   BMI 58.45 kg/m   Review of Systems Denies leg cramps.     Objective:   Physical Exam VITAL SIGNS:  See vs page GENERAL: no distress Ext: no edema.       Assessment & Plan:  Vit-D deficiency, due for recheck Hyperparathyroidism, prob primary.  Recheck today  Patient Instructions  blood tests are requested for you today.  We'll let you know about the results. With this information, we can tell if the calcium is due to an overactive parathyroid.   If we do find that, and it is mild, all we need to do is to follow it for now.  Other tests we might need to do are the bone-density and 24-HR urine for calcium.  Please come back for a follow-up appointment in 6 months.

## 2017-05-08 NOTE — Addendum Note (Signed)
Addendum  created 05/08/17 1442 by Kipp Brood, MD   Sign clinical note

## 2017-05-09 LAB — PTH, INTACT AND CALCIUM
CALCIUM: 10.9 mg/dL — AB (ref 8.6–10.3)
PTH: 116 pg/mL — AB (ref 14–64)

## 2017-05-12 MED FILL — LOSARTAN POTASSIUM 50 MG TA: 50 | 90 days supply | Qty: 90 | Fill #1

## 2017-05-12 MED FILL — XARELTO 20 MG TABLET: 20 | 30 days supply | Qty: 30 | Fill #3

## 2017-05-16 DIAGNOSIS — G4733 Obstructive sleep apnea (adult) (pediatric): Secondary | ICD-10-CM | POA: Diagnosis not present

## 2017-05-23 DIAGNOSIS — R0902 Hypoxemia: Secondary | ICD-10-CM | POA: Diagnosis not present

## 2017-05-23 DIAGNOSIS — G4733 Obstructive sleep apnea (adult) (pediatric): Secondary | ICD-10-CM | POA: Diagnosis not present

## 2017-06-12 MED FILL — METOPROLOL TARTRATE 50 MG T: 50 | 30 days supply | Qty: 60 | Fill #4

## 2017-06-12 MED FILL — XARELTO 20 MG TABLET: 20 | 30 days supply | Qty: 30 | Fill #4

## 2017-06-22 DIAGNOSIS — G4733 Obstructive sleep apnea (adult) (pediatric): Secondary | ICD-10-CM | POA: Diagnosis not present

## 2017-06-22 DIAGNOSIS — R0902 Hypoxemia: Secondary | ICD-10-CM | POA: Diagnosis not present

## 2017-07-11 MED FILL — XARELTO 20 MG TABLET: 20 | 30 days supply | Qty: 30 | Fill #5

## 2017-07-11 MED FILL — METOPROLOL TARTRATE 50 MG T: 50 | 30 days supply | Qty: 60 | Fill #5

## 2017-07-23 DIAGNOSIS — G4733 Obstructive sleep apnea (adult) (pediatric): Secondary | ICD-10-CM | POA: Diagnosis not present

## 2017-07-23 DIAGNOSIS — R0902 Hypoxemia: Secondary | ICD-10-CM | POA: Diagnosis not present

## 2017-07-25 ENCOUNTER — Telehealth: Payer: Self-pay | Admitting: Physician Assistant

## 2017-07-25 NOTE — Telephone Encounter (Signed)
English - Pt's wife dropped off handicap drivers registration plate to be filled out.  I have placed in your box.  Call [phone] when ready to pick up.

## 2017-07-29 NOTE — Telephone Encounter (Signed)
Please advise 

## 2017-08-11 ENCOUNTER — Other Ambulatory Visit: Payer: Self-pay | Admitting: Internal Medicine

## 2017-08-11 MED FILL — METOPROLOL TARTRATE 50 MG T: 50 | 30 days supply | Qty: 60 | Fill #6

## 2017-08-11 MED FILL — LOSARTAN POTASSIUM 50 MG TA: 50 | 90 days supply | Qty: 90 | Fill #2

## 2017-08-11 MED FILL — XARELTO 20 MG TABLET: 20 | 30 days supply | Qty: 30 | Fill #0

## 2017-08-12 DIAGNOSIS — G4733 Obstructive sleep apnea (adult) (pediatric): Secondary | ICD-10-CM | POA: Diagnosis not present

## 2017-08-13 DIAGNOSIS — G4733 Obstructive sleep apnea (adult) (pediatric): Secondary | ICD-10-CM | POA: Diagnosis not present

## 2017-08-13 DIAGNOSIS — J9611 Chronic respiratory failure with hypoxia: Secondary | ICD-10-CM | POA: Diagnosis not present

## 2017-08-13 DIAGNOSIS — Z6841 Body Mass Index (BMI) 40.0 and over, adult: Secondary | ICD-10-CM | POA: Diagnosis not present

## 2017-08-14 NOTE — Telephone Encounter (Signed)
PATIENT CALLED THE [facility] TO FIND OUT THE STATUS OF THE HANDICAP FORM. STEPHANIE DID SIGN THE FORM TODAY. THE PATIENT WAS TOLD AND THE FORM WILL BE IN THE "PICK-UP DRAWER" AT [address]. MBC

## 2017-08-16 NOTE — Telephone Encounter (Signed)
Given to Tesoro Corporationmary chapman, thank you

## 2017-08-22 DIAGNOSIS — G4733 Obstructive sleep apnea (adult) (pediatric): Secondary | ICD-10-CM | POA: Diagnosis not present

## 2017-08-22 DIAGNOSIS — R0902 Hypoxemia: Secondary | ICD-10-CM | POA: Diagnosis not present

## 2017-08-25 DIAGNOSIS — G4733 Obstructive sleep apnea (adult) (pediatric): Secondary | ICD-10-CM | POA: Diagnosis not present

## 2017-08-27 DIAGNOSIS — I4891 Unspecified atrial fibrillation: Secondary | ICD-10-CM | POA: Diagnosis not present

## 2017-08-27 DIAGNOSIS — G894 Chronic pain syndrome: Secondary | ICD-10-CM | POA: Diagnosis not present

## 2017-08-27 DIAGNOSIS — Z7901 Long term (current) use of anticoagulants: Secondary | ICD-10-CM | POA: Diagnosis not present

## 2017-08-27 DIAGNOSIS — Z23 Encounter for immunization: Secondary | ICD-10-CM | POA: Diagnosis not present

## 2017-08-27 DIAGNOSIS — I10 Essential (primary) hypertension: Secondary | ICD-10-CM | POA: Diagnosis not present

## 2017-08-27 MED FILL — OXYCODONE/APAP 10/325 MG TA: 10-325 | 5 days supply | Qty: 20 | Fill #0

## 2017-09-03 DIAGNOSIS — Z0001 Encounter for general adult medical examination with abnormal findings: Secondary | ICD-10-CM | POA: Diagnosis not present

## 2017-09-03 DIAGNOSIS — R7301 Impaired fasting glucose: Secondary | ICD-10-CM | POA: Diagnosis not present

## 2017-09-03 DIAGNOSIS — I10 Essential (primary) hypertension: Secondary | ICD-10-CM | POA: Diagnosis not present

## 2017-09-03 MED FILL — SHINGRIX 50 MCG SUS: 50 | 30 days supply | Qty: 1 | Fill #0

## 2017-09-17 ENCOUNTER — Other Ambulatory Visit: Payer: Self-pay | Admitting: Student

## 2017-09-17 MED FILL — XARELTO 20 MG TABLET: 20 | 30 days supply | Qty: 30 | Fill #1

## 2017-09-18 MED FILL — METOPROLOL TARTRATE 50 MG T: 50 | 30 days supply | Qty: 60 | Fill #0

## 2017-09-18 NOTE — Telephone Encounter (Signed)
This is Dr. Hilty's pt 

## 2017-09-19 DIAGNOSIS — G894 Chronic pain syndrome: Secondary | ICD-10-CM | POA: Diagnosis not present

## 2017-09-19 DIAGNOSIS — B349 Viral infection, unspecified: Secondary | ICD-10-CM | POA: Diagnosis not present

## 2017-09-19 DIAGNOSIS — R040 Epistaxis: Secondary | ICD-10-CM | POA: Diagnosis not present

## 2017-09-19 DIAGNOSIS — I10 Essential (primary) hypertension: Secondary | ICD-10-CM | POA: Diagnosis not present

## 2017-09-19 MED FILL — BENZONATATE 100 MG CAPSULE: 100 | 10 days supply | Qty: 30 | Fill #0

## 2017-09-19 MED FILL — CETIRIZINE HCL 10 MG TABS: 10 | 100 days supply | Qty: 100 | Fill #0

## 2017-09-19 MED FILL — GABAPENTIN 300 MG CAPSULE: 300 | 30 days supply | Qty: 90 | Fill #0

## 2017-09-22 DIAGNOSIS — G4733 Obstructive sleep apnea (adult) (pediatric): Secondary | ICD-10-CM | POA: Diagnosis not present

## 2017-09-22 DIAGNOSIS — R0902 Hypoxemia: Secondary | ICD-10-CM | POA: Diagnosis not present

## 2017-09-26 ENCOUNTER — Ambulatory Visit: Payer: 59 | Admitting: Endocrinology

## 2017-10-02 DIAGNOSIS — Z1212 Encounter for screening for malignant neoplasm of rectum: Secondary | ICD-10-CM | POA: Diagnosis not present

## 2017-10-02 DIAGNOSIS — Z1211 Encounter for screening for malignant neoplasm of colon: Secondary | ICD-10-CM | POA: Diagnosis not present

## 2017-10-07 DIAGNOSIS — M5136 Other intervertebral disc degeneration, lumbar region: Secondary | ICD-10-CM | POA: Diagnosis not present

## 2017-10-07 DIAGNOSIS — Z79899 Other long term (current) drug therapy: Secondary | ICD-10-CM | POA: Diagnosis not present

## 2017-10-07 MED FILL — OXYCODONE-APAP 10-325 MG TA: 10-325 | 14 days supply | Qty: 42 | Fill #0

## 2017-10-14 DIAGNOSIS — R040 Epistaxis: Secondary | ICD-10-CM | POA: Diagnosis not present

## 2017-10-15 ENCOUNTER — Encounter: Payer: Self-pay | Admitting: Endocrinology

## 2017-10-15 ENCOUNTER — Ambulatory Visit: Payer: 59 | Admitting: Endocrinology

## 2017-10-15 LAB — VITAMIN D 25 HYDROXY (VIT D DEFICIENCY, FRACTURES): VITD: 29.49 ng/mL — AB (ref 30.00–100.00)

## 2017-10-15 NOTE — Progress Notes (Signed)
Subjective:    Patient ID: Charles Barnett, male    DOB: 25-Jan-1966, 52 y.o.   MRN: [id number]  HPI Pt returns for f/u of hypercalcemia (dx'ed 2016; he has never had osteoporosis, urolithiasis, thyroid probs, sarcoidosis, cancer, PUD, pancreatitis, depression, or bony fracture.  He does not take A supplement; hyperparathyroidism and Vit-D deficiency were found, he needed vit-D supplementation in order to confirm that elev PTH was primary).  He takes vit-D, 5000 units/d.  main symptom is chronic arthralgia.   Past Medical History:  Diagnosis Date  . Arthritis   . Atrial flutter (HCC) 12/2016  . Hypertension   . Neuromuscular disorder (HCC)   . Sleep apnea     Past Surgical History:  Procedure Laterality Date  . CARDIOVERSION N/A 01/30/2017   Procedure: CARDIOVERSION;  Surgeon: Wendall Stade, MD;  Location: [facility] ENDOSCOPY;  Service: Cardiovascular;  Laterality: N/A;  . SPINE SURGERY      Social History   Socioeconomic History  . Marital status: Married    Spouse name: Not on file  . Number of children: 3  . Years of education: Not on file  . Highest education level: Not on file  Social Needs  . Financial resource strain: Not on file  . Food insecurity - worry: Not on file  . Food insecurity - inability: Not on file  . Transportation needs - medical: Not on file  . Transportation needs - non-medical: Not on file  Occupational History  . Not on file  Tobacco Use  . Smoking status: Never Smoker  . Smokeless tobacco: Never Used  Substance and Sexual Activity  . Alcohol use: No    Alcohol/week: 0.0 oz  . Drug use: No  . Sexual activity: Yes    Partners: Female  Other Topics Concern  . Not on file  Social History Narrative  . Not on file    Current Outpatient Medications on File Prior to Visit  Medication Sig Dispense Refill  . cetirizine (ZYRTEC) 10 MG tablet Take 1 tablet (10 mg total) by mouth daily. 30 tablet 1  . metoprolol tartrate (LOPRESSOR) 50 MG tablet TAKE  1 TABLET (50 MG TOTAL) BY MOUTH 2 (TWO) TIMES DAILY. 60 tablet 6  . oxyCODONE-acetaminophen (PERCOCET) 10-325 MG tablet Take 1 tablet by mouth every 6 (six) hours as needed for pain.     . OXYCONTIN 20 MG 12 hr tablet Take 1 tablet by mouth 2 (two) times daily.  0  . tiZANidine (ZANAFLEX) 4 MG tablet Take 4 mg by mouth 3 (three) times daily as needed for muscle spasms.     Carlena Hurl 20 MG TABS tablet TAKE 1 TABLET (20 MG TOTAL) BY MOUTH DAILY WITH SUPPER. 30 tablet 3  . losartan (COZAAR) 50 MG tablet Take 1 tablet (50 mg total) by mouth daily. 90 tablet 3   No current facility-administered medications on file prior to visit.     Allergies  Allergen Reactions  . Ibuprofen Other (See Comments)    "bloody stools"  . Iodine Swelling    Contrast dye  . Iodinated Diagnostic Agents Swelling    Family History  Problem Relation Age of Onset  . Diabetes Mother   . Hyperlipidemia Mother   . Hypertension Mother   . Heart disease Mother   . Diabetes Sister   . Hypertension Sister   . Heart disease Sister   . Hyperparathyroidism Neg Hx     BP (!) 172/108 (BP Location: Left Arm, Patient Position: Sitting,  Cuff Size: Normal)   Pulse 84   Temp 97.7 F (36.5 C) (Oral)   Ht 6' (1.829 m)   Wt (!) 448 lb 12.8 oz (203.6 kg)   SpO2 94%   BMI 60.87 kg/m    Review of Systems Denies muscle cramps.     Objective:   Physical Exam VITAL SIGNS:  See vs page.  GENERAL: no distress. Chest wall: no kyphosis Gait: normal and steady      Assessment & Plan:  Hyperparathyroidism: prob primary.  Vit-D deficiency: this is contributing to, but not causing, the elevated PTH.  Patient Instructions  blood tests are requested for you today.  We'll let you know about the results. It is unlikely that the parathyroid is high just due to the low-vitamin-D.  We'll find out on these blood tests.  Therefore, the next step would be to check the bone-density test.  If that is ok, no treatment would be needed,  so please the come back for a follow-up appointment in 6 months

## 2017-10-15 NOTE — Patient Instructions (Signed)
blood tests are requested for you today.  We'll let you know about the results. It is unlikely that the parathyroid is high just due to the low-vitamin-D.  We'll find out on these blood tests.  Therefore, the next step would be to check the bone-density test.  If that is ok, no treatment would be needed, so please the come back for a follow-up appointment in 6 months

## 2017-10-17 LAB — PTH, INTACT AND CALCIUM
CALCIUM: 10.8 mg/dL — AB (ref 8.6–10.3)
PTH: 109 pg/mL — AB (ref 14–64)

## 2017-10-18 ENCOUNTER — Other Ambulatory Visit: Payer: Self-pay | Admitting: Endocrinology

## 2017-10-18 DIAGNOSIS — E210 Primary hyperparathyroidism: Secondary | ICD-10-CM | POA: Insufficient documentation

## 2017-10-20 MED FILL — METOPROLOL TARTRATE 50 MG T: 50 | 30 days supply | Qty: 60 | Fill #1

## 2017-10-20 MED FILL — XARELTO 20 MG TABLET: 20 | 30 days supply | Qty: 30 | Fill #2

## 2017-10-21 DIAGNOSIS — M5136 Other intervertebral disc degeneration, lumbar region: Secondary | ICD-10-CM | POA: Diagnosis not present

## 2017-10-21 DIAGNOSIS — Z79899 Other long term (current) drug therapy: Secondary | ICD-10-CM | POA: Diagnosis not present

## 2017-10-21 MED FILL — OXYCODONE-APAP 10-325: 10-325 | 30 days supply | Qty: 120 | Fill #0

## 2017-10-23 DIAGNOSIS — G4733 Obstructive sleep apnea (adult) (pediatric): Secondary | ICD-10-CM | POA: Diagnosis not present

## 2017-10-23 DIAGNOSIS — R0902 Hypoxemia: Secondary | ICD-10-CM | POA: Diagnosis not present

## 2017-11-05 ENCOUNTER — Telehealth: Payer: Self-pay

## 2017-11-05 NOTE — Telephone Encounter (Signed)
[facility] pt is having 2 extractions, a crown, bridge and root canal.

## 2017-11-05 NOTE — Telephone Encounter (Signed)
   [facility] Pre-operative Risk Assessment    Request for surgical clearance:  1. What type of surgery is being performed? Dental Cleaning,Radiographs,Filling,Extraction   2. When is this surgery scheduled? pending  3. What type of clearance is required (medical clearance vs. Pharmacy clearance to hold med vs. Both)? pharmacy  4. Are there any medications that need to be held prior to surgery and how long? Xarelto  5. Practice name and name of physician performing surgery? [facility]   6. What is your office phone and fax number? Phone # [phone]  Fax # [phone]  7. Anesthesia type (None, local, MAC, general) ? local   Charles Barnett 11/05/2017, 2:37 PM  _________________________________________________________________   (provider comments below)

## 2017-11-06 ENCOUNTER — Ambulatory Visit: Payer: 59 | Admitting: Endocrinology

## 2017-11-06 NOTE — Telephone Encounter (Signed)
Pharmacy recommendations faxed to Dr Llana [facility] office.

## 2017-11-06 NOTE — Telephone Encounter (Signed)
Patient with diagnosis of atrial fibrillation on Xarelto for anticoagulation.    Procedure: 2 dental extractions, root canal, bridge, crown Date of procedure: tbd  CHADS2-VASc score of  1 (HTN)  CrCl - with actual body weight 203, with IBW 77.6 Platelet count 230  We do not recommend holding Xarelto for this procedure.  However if dentist is not comfortable with that, patient can safely hold Xarelto day prior to procedure.  If a dose is held, patient should re-start Xarelto evening of the procedure or day after at dentist discretion.

## 2017-11-11 DIAGNOSIS — H524 Presbyopia: Secondary | ICD-10-CM | POA: Diagnosis not present

## 2017-11-11 DIAGNOSIS — H5203 Hypermetropia, bilateral: Secondary | ICD-10-CM | POA: Diagnosis not present

## 2017-11-13 NOTE — Telephone Encounter (Signed)
Received another faxed request for clearance. Routed this clearance noted via epic fax to [phone] ([facility])

## 2017-11-18 DIAGNOSIS — G8929 Other chronic pain: Secondary | ICD-10-CM | POA: Diagnosis not present

## 2017-11-18 DIAGNOSIS — M5136 Other intervertebral disc degeneration, lumbar region: Secondary | ICD-10-CM | POA: Diagnosis not present

## 2017-11-18 DIAGNOSIS — M545 Low back pain: Secondary | ICD-10-CM | POA: Diagnosis not present

## 2017-11-18 DIAGNOSIS — Z79899 Other long term (current) drug therapy: Secondary | ICD-10-CM | POA: Diagnosis not present

## 2017-11-18 MED FILL — OXYCODONE-APAP 10-325: 10-325 | 30 days supply | Qty: 120 | Fill #0

## 2017-11-18 MED FILL — XARELTO 20 MG TABLET: 20 | 30 days supply | Qty: 30 | Fill #3

## 2017-11-18 MED FILL — LOSARTAN POTASSIUM 50 MG TA: 50 | 90 days supply | Qty: 90 | Fill #3

## 2017-11-18 MED FILL — METOPROLOL TARTRATE 50 MG T: 50 | 30 days supply | Qty: 60 | Fill #2

## 2017-11-19 MED FILL — BUPRENORPHINE 10 MCG/HR PTW: 10 | 28 days supply | Qty: 4 | Fill #0

## 2017-11-20 DIAGNOSIS — G4733 Obstructive sleep apnea (adult) (pediatric): Secondary | ICD-10-CM | POA: Diagnosis not present

## 2017-11-20 DIAGNOSIS — R0902 Hypoxemia: Secondary | ICD-10-CM | POA: Diagnosis not present

## 2017-12-15 ENCOUNTER — Encounter: Payer: Self-pay | Admitting: Physician Assistant

## 2017-12-16 DIAGNOSIS — Z79899 Other long term (current) drug therapy: Secondary | ICD-10-CM | POA: Diagnosis not present

## 2017-12-16 DIAGNOSIS — M5136 Other intervertebral disc degeneration, lumbar region: Secondary | ICD-10-CM | POA: Diagnosis not present

## 2017-12-19 ENCOUNTER — Other Ambulatory Visit: Payer: Self-pay | Admitting: Internal Medicine

## 2017-12-19 MED FILL — BELBUCA 150 MCG FILM: 150 | 30 days supply | Qty: 60 | Fill #0

## 2017-12-19 MED FILL — XARELTO 20 MG TABLET: 20 | 90 days supply | Qty: 90 | Fill #0

## 2017-12-19 MED FILL — METOPROLOL TARTRATE 50 MG T: 50 | 30 days supply | Qty: 60 | Fill #3

## 2017-12-19 MED FILL — OXYCODONE-APAP 10-325 MG TA: 10-325 | 30 days supply | Qty: 120 | Fill #0

## 2017-12-21 DIAGNOSIS — R0902 Hypoxemia: Secondary | ICD-10-CM | POA: Diagnosis not present

## 2017-12-21 DIAGNOSIS — G4733 Obstructive sleep apnea (adult) (pediatric): Secondary | ICD-10-CM | POA: Diagnosis not present

## 2017-12-24 DIAGNOSIS — G4733 Obstructive sleep apnea (adult) (pediatric): Secondary | ICD-10-CM | POA: Diagnosis not present

## 2018-01-15 DIAGNOSIS — Z79899 Other long term (current) drug therapy: Secondary | ICD-10-CM | POA: Diagnosis not present

## 2018-01-15 DIAGNOSIS — M5136 Other intervertebral disc degeneration, lumbar region: Secondary | ICD-10-CM | POA: Diagnosis not present

## 2018-01-19 MED FILL — METOPROLOL TARTRATE 50 MG T: 50 | 30 days supply | Qty: 60 | Fill #4

## 2018-01-19 MED FILL — BELBUCA 300 MCG FILM: 300 | 30 days supply | Qty: 60 | Fill #0

## 2018-01-19 MED FILL — OXYCODONE-APAP 10-325: 10-325 | 30 days supply | Qty: 120 | Fill #0

## 2018-01-20 DIAGNOSIS — R0902 Hypoxemia: Secondary | ICD-10-CM | POA: Diagnosis not present

## 2018-01-20 DIAGNOSIS — G4733 Obstructive sleep apnea (adult) (pediatric): Secondary | ICD-10-CM | POA: Diagnosis not present

## 2018-02-10 DIAGNOSIS — Z79899 Other long term (current) drug therapy: Secondary | ICD-10-CM | POA: Diagnosis not present

## 2018-02-10 DIAGNOSIS — M5136 Other intervertebral disc degeneration, lumbar region: Secondary | ICD-10-CM | POA: Diagnosis not present

## 2018-02-17 ENCOUNTER — Encounter: Payer: Self-pay | Admitting: Internal Medicine

## 2018-02-17 ENCOUNTER — Ambulatory Visit: Payer: 59 | Admitting: Internal Medicine

## 2018-02-17 VITALS — BP 132/88 | HR 56 | Ht 72.0 in | Wt >= 6400 oz

## 2018-02-17 DIAGNOSIS — G4733 Obstructive sleep apnea (adult) (pediatric): Secondary | ICD-10-CM | POA: Diagnosis not present

## 2018-02-17 DIAGNOSIS — I4892 Unspecified atrial flutter: Secondary | ICD-10-CM | POA: Diagnosis not present

## 2018-02-17 DIAGNOSIS — I10 Essential (primary) hypertension: Secondary | ICD-10-CM

## 2018-02-17 MED ORDER — LOSARTAN POTASSIUM 50 MG PO TABS: 50.0000 mg | ORAL_TABLET | Freq: Every day | ORAL | 3 refills | Status: DC

## 2018-02-17 MED FILL — LOSARTAN POTASSIUM 50 MG TA: 50 | 90 days supply | Qty: 90 | Fill #0

## 2018-02-17 NOTE — Patient Instructions (Signed)
Your physician wants you to follow-up in: ONE YEAR with Dr. Hilty. You will receive a reminder letter in the mail two months in advance. If you don't receive a letter, please call our office to schedule the follow-up appointment.  

## 2018-02-17 NOTE — Progress Notes (Signed)
OFFICE FOLLOW-UP NOTE  Chief Complaint:  Hospital follow-up  Primary Care Physician: Dois Davenportichter, Karen L, MD  HPI:  Charles Barnett is a 52 y.o. male with a past medial history significant for morbid obesity, obstructive sleep apnea on CPAP, arthritis, hypertension and neuromuscular disorder and chronic low back pain on narcotics and status post spinal stimulator. He reports a one-week history of chest pain and cough, but in retrospect he's had fatigue and shortness of breath for several weeks. He presented to [facility] urgent care today for evaluation and he was found to have atrial flutter with rapid ventricular response. EMS was called to urgent care where he was evaluated and given 5 mg of IV Cardizem. Initial lab work in the ER was unremarkable. Initial troponin was negative. Chest x-ray shows mild cardiomegaly and mild interstitial prominence, probably suggestive of heart failure. He was placed on by mouth Cardizem and Xarelto and discharged with plan for outpatient cardioversion. He [place] as an outpatient and was switched from Cardizem to Lopressor (there was a possible Cardizem side effect). LVEF was normal on echo. He underwent successful DC cardioversion and is maintaining sinus rhythm. He says he does report some energy increased. Blood pressure remains elevated however today. He was placed on losartan HCTZ which was discontinued. He was also noted recently to have elevated parathyroid hormone and hypercalcemia. He scheduled to see Dr. Everardo AllEllison with endocrinology.  02/17/2018  Charles Barnett returns today for follow-up.  Since I last saw him he is done well with regards to atrial flutter.  He denies any recurrence.  He has no chest pain or worsening shortness of breath.  Unfortunately weight has gone up further up to 448 pounds.  He is now in the super morbid obesity category with a BMI greater than 60.  Despite this blood pressure is well controlled today.  He is seen by  [facility] with Dr. 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  He does not seem to have a diagnosis of diabetes at this point.  He reports significant knee and back pain for which she is on narcotics.  This limits his ambulation.  PMHx:  Past Medical History:  Diagnosis Date  . Arthritis   . Atrial flutter (HCC) 12/2016  . Hypertension   . Neuromuscular disorder (HCC)   . Sleep apnea     Past Surgical History:  Procedure Laterality Date  . CARDIOVERSION N/A 01/30/2017   Procedure: CARDIOVERSION;  Surgeon: Wendall StadeNishan, Peter C, MD;  [facility];  Service: Cardiovascular;  Laterality: N/A;  . SPINE SURGERY      FAMHx:  Family History  Problem Relation Age of Onset  . Diabetes Mother   . Hyperlipidemia Mother   . Hypertension Mother   . Heart disease Mother   . Diabetes Sister   . Hypertension Sister   . Heart disease Sister   . Hyperparathyroidism Neg Hx     SOCHx:   reports that he has never smoked. He has never used smokeless tobacco. He reports that he does not drink alcohol or use drugs.  ALLERGIES:  Allergies  Allergen Reactions  . Ibuprofen Other (See Comments)    "bloody stools" Other reaction(s): Other Bloody stool  . Iodine Swelling    Contrast dye  . Iodinated Diagnostic Agents Swelling    ROS: Pertinent items noted in HPI and remainder of comprehensive ROS otherwise negative.  HOME MEDS: Current Outpatient Medications on File Prior to Visit  Medication Sig Dispense Refill  . cetirizine (ZYRTEC) 10 MG tablet Take 1 tablet (10 mg total) by mouth daily. 30 tablet 1  . losartan (COZAAR) 50 MG tablet Take 1 tablet (50 mg total) by mouth daily. 90 tablet 3  . metoprolol tartrate (LOPRESSOR) 50 MG tablet TAKE 1 TABLET (50 MG TOTAL) BY MOUTH 2 (TWO) TIMES DAILY. 60 tablet 6  . oxyCODONE-acetaminophen (PERCOCET) 10-325 MG tablet Take 1 tablet by mouth every 6 (six) hours as needed for pain.      . OXYCONTIN 20 MG 12 hr tablet Take 1 tablet by mouth 2 (two) times daily.  0  . tiZANidine (ZANAFLEX) 4 MG tablet Take 4 mg by mouth 3 (three) times daily as needed for muscle spasms.     Carlena Hurl 20 MG TABS tablet TAKE 1 TABLET (20 MG TOTAL) BY MOUTH DAILY WITH SUPPER. 90 tablet 0   No current facility-administered medications on file prior to visit.     LABS/IMAGING: No results found for this or any previous visit (from the past 48 hour(s)). No results found.  LIPID PANEL:    Component Value Date/Time   CHOL 176 08/08/2015 1348   TRIG 96 08/08/2015 1348   HDL 43 08/08/2015 1348   CHOLHDL 4.1 08/08/2015 1348   VLDL 19 08/08/2015 1348   LDLCALC 114 08/08/2015 1348     WEIGHTS: Wt Readings from Last 3 Encounters:  10/15/17 (!) 448 lb 12.8 oz (203.6 kg)  05/08/17 (!) 431 lb (195.5 kg)  03/26/17 (!) 433 lb (196.4 kg)    VITALS: There were no vitals taken for this visit.  EXAM: General appearance: alert, no distress and morbidly obese Neck: no carotid bruit and no JVD Lungs: clear to auscultation bilaterally Heart: regular rate and rhythm Abdomen: soft, non-tender; bowel sounds normal; no masses,  no organomegaly Extremities: extremities normal, atraumatic, no cyanosis or edema Pulses: 2+ and symmetric Skin: Skin color, texture, turgor normal. No rashes or lesions Neurologic: Grossly normal Psych: Pleasant  EKG: Sinus bradycardia first-degree AV block at 56, low voltage QRS-personally reviewed  ASSESSMENT: 1. Recent paroxysmal atrial flutter status post DCCV-CHADSVASC score of 1 (2018) 2. Hypertension-uncontrolled 3. Super morbid obesity 4. Obstructive sleep apnea on CPAP  PLAN: 1.   Charles Barnett now is super morbidly obese with a BMI over 60.  We had a long discussion about his significant weight gain and the impact will have on his health in the future.  He says he is lost down to about 330 pounds before.  I believe he could lose more weight if he is actively  engaged in that.  That being said, it would be lifesaving for him to consider gastric bypass surgery.  He did not seem interested at this time however I was happy to refer him to comprehensive weight management clinic.  He said he would consider it.  At this point I would continue on the Xarelto given his significant weight and risk for thrombus follow-up annually sooner as necessary.  Chrystie Nose, MD, [facility], FACP  [place]  [facility]  Medical Director of the [facility] Lipid Disorders &  Cardiovascular Risk Reduction Clinic Diplomate of the American Board of Clinical Lipidology Attending Cardiologist  Direct Dial: [phone]  Fax: [phone]  Website:  [email or web address] Nicely Pistol Kessenich 02/17/2018, 8:04 AM

## 2018-02-18 MED FILL — BELBUCA 450 MCG FILM: 450 | 30 days supply | Qty: 60 | Fill #0

## 2018-02-18 MED FILL — OXYCODONE-APAP 10-325: 10-325 | 30 days supply | Qty: 120 | Fill #0

## 2018-02-19 MED FILL — METOPROLOL TARTRATE 50 MG T: 50 | 30 days supply | Qty: 60 | Fill #5

## 2018-02-20 DIAGNOSIS — R0902 Hypoxemia: Secondary | ICD-10-CM | POA: Diagnosis not present

## 2018-02-20 DIAGNOSIS — G4733 Obstructive sleep apnea (adult) (pediatric): Secondary | ICD-10-CM | POA: Diagnosis not present

## 2018-03-06 DIAGNOSIS — I10 Essential (primary) hypertension: Secondary | ICD-10-CM | POA: Diagnosis not present

## 2018-03-06 DIAGNOSIS — Z7901 Long term (current) use of anticoagulants: Secondary | ICD-10-CM | POA: Diagnosis not present

## 2018-03-06 DIAGNOSIS — E119 Type 2 diabetes mellitus without complications: Secondary | ICD-10-CM | POA: Diagnosis not present

## 2018-03-06 DIAGNOSIS — G894 Chronic pain syndrome: Secondary | ICD-10-CM | POA: Diagnosis not present

## 2018-03-06 DIAGNOSIS — Z0001 Encounter for general adult medical examination with abnormal findings: Secondary | ICD-10-CM | POA: Diagnosis not present

## 2018-03-06 DIAGNOSIS — Z23 Encounter for immunization: Secondary | ICD-10-CM | POA: Diagnosis not present

## 2018-03-06 DIAGNOSIS — I4891 Unspecified atrial fibrillation: Secondary | ICD-10-CM | POA: Diagnosis not present

## 2018-03-06 MED FILL — SHINGRIX 50 MCG SUS: 50 | 30 days supply | Qty: 1 | Fill #1

## 2018-03-12 DIAGNOSIS — G8929 Other chronic pain: Secondary | ICD-10-CM | POA: Diagnosis not present

## 2018-03-12 DIAGNOSIS — M5136 Other intervertebral disc degeneration, lumbar region: Secondary | ICD-10-CM | POA: Diagnosis not present

## 2018-03-12 DIAGNOSIS — M545 Low back pain: Secondary | ICD-10-CM | POA: Diagnosis not present

## 2018-03-12 DIAGNOSIS — Z79899 Other long term (current) drug therapy: Secondary | ICD-10-CM | POA: Diagnosis not present

## 2018-03-17 ENCOUNTER — Other Ambulatory Visit: Payer: Self-pay | Admitting: Internal Medicine

## 2018-03-17 MED FILL — METOPROLOL TARTRATE 50 MG T: 50 | 30 days supply | Qty: 60 | Fill #6

## 2018-03-18 MED FILL — OXYCODONE-APAP 10-325: 10-325 | 30 days supply | Qty: 120 | Fill #0

## 2018-03-18 MED FILL — BELBUCA 450 MCG FILM: 450 | 30 days supply | Qty: 60 | Fill #0

## 2018-03-18 MED FILL — XARELTO 20 MG TABLET: 20 | 90 days supply | Qty: 90 | Fill #0

## 2018-03-22 DIAGNOSIS — R0902 Hypoxemia: Secondary | ICD-10-CM | POA: Diagnosis not present

## 2018-03-22 DIAGNOSIS — G4733 Obstructive sleep apnea (adult) (pediatric): Secondary | ICD-10-CM | POA: Diagnosis not present

## 2018-03-31 DIAGNOSIS — G4733 Obstructive sleep apnea (adult) (pediatric): Secondary | ICD-10-CM | POA: Diagnosis not present

## 2018-04-09 DIAGNOSIS — M5136 Other intervertebral disc degeneration, lumbar region: Secondary | ICD-10-CM | POA: Diagnosis not present

## 2018-04-09 DIAGNOSIS — M1990 Unspecified osteoarthritis, unspecified site: Secondary | ICD-10-CM | POA: Diagnosis not present

## 2018-04-09 DIAGNOSIS — Z79899 Other long term (current) drug therapy: Secondary | ICD-10-CM | POA: Diagnosis not present

## 2018-04-09 MED FILL — GABAPENTIN 300 MG CAPSULE: 300 | 30 days supply | Qty: 90 | Fill #0

## 2018-04-14 ENCOUNTER — Ambulatory Visit: Payer: 59 | Admitting: Endocrinology

## 2018-04-16 IMAGING — CR DG KNEE 3 VIEWS*R*
3 series · 3 of 3 positions shown · non-contrast
Comparison: [DATE] .

CLINICAL DATA: Right knee popping.

EXAM:
RIGHT KNEE - 3 VIEW

[t knee ap right]
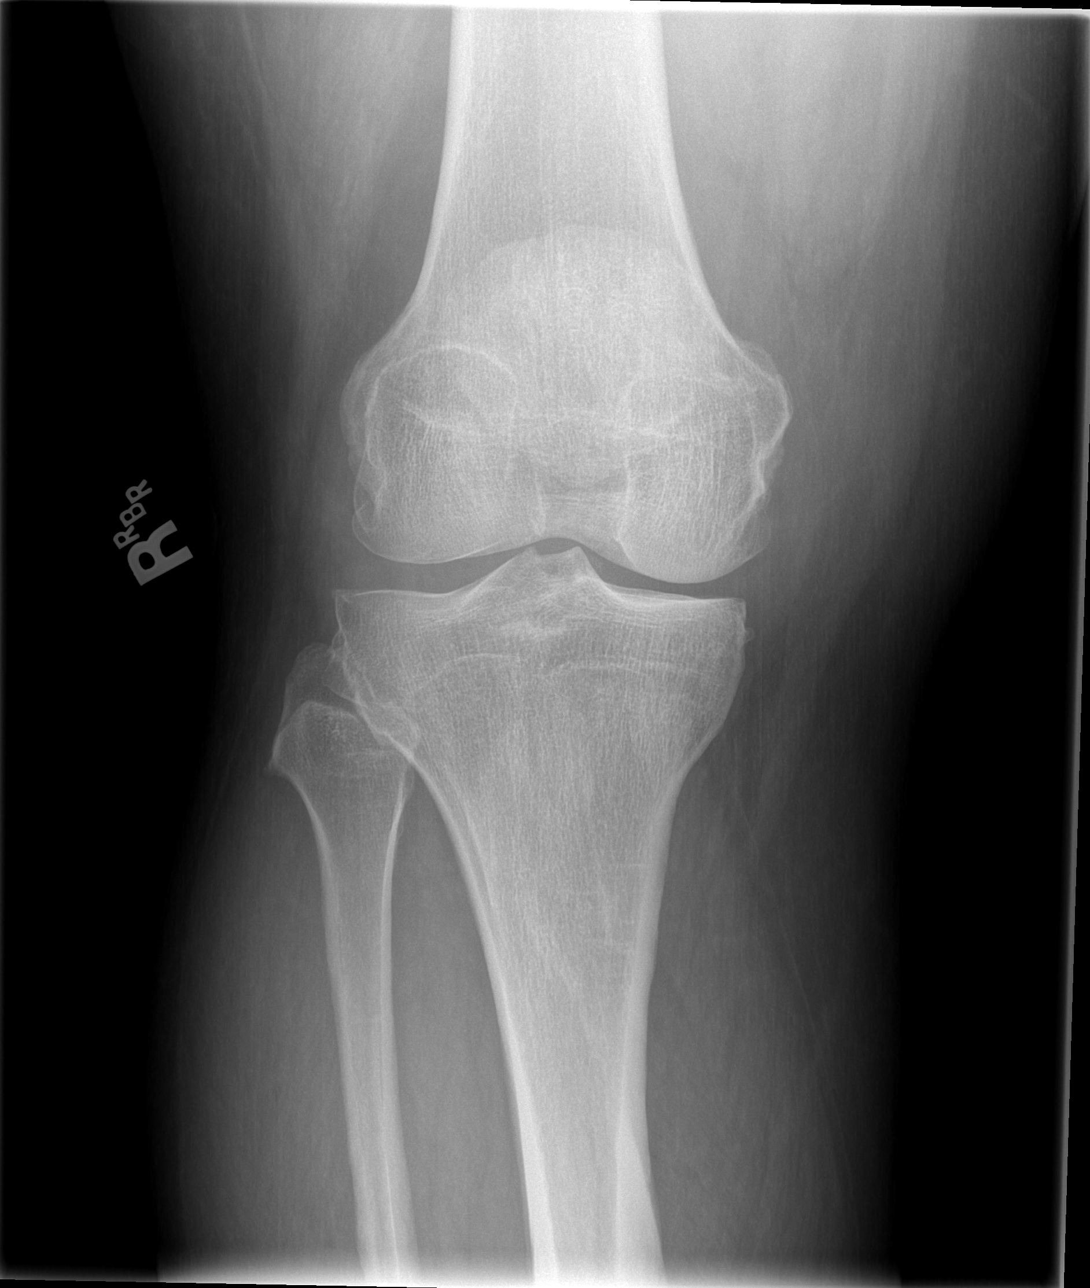

[t knee lat right]
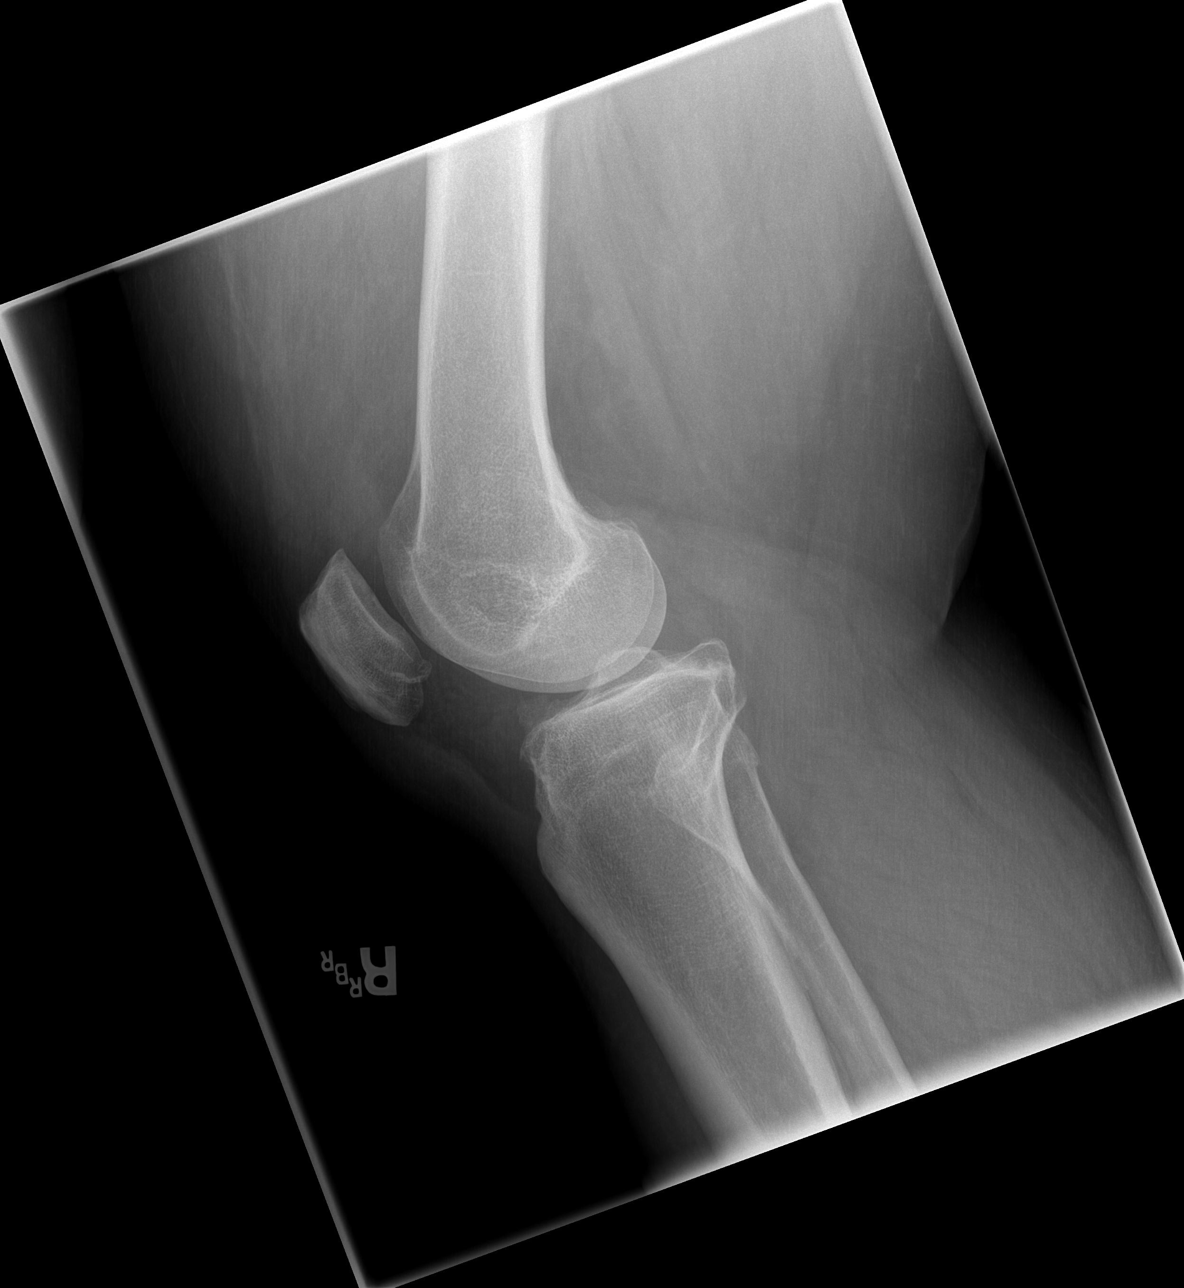

[t knee ap right *]
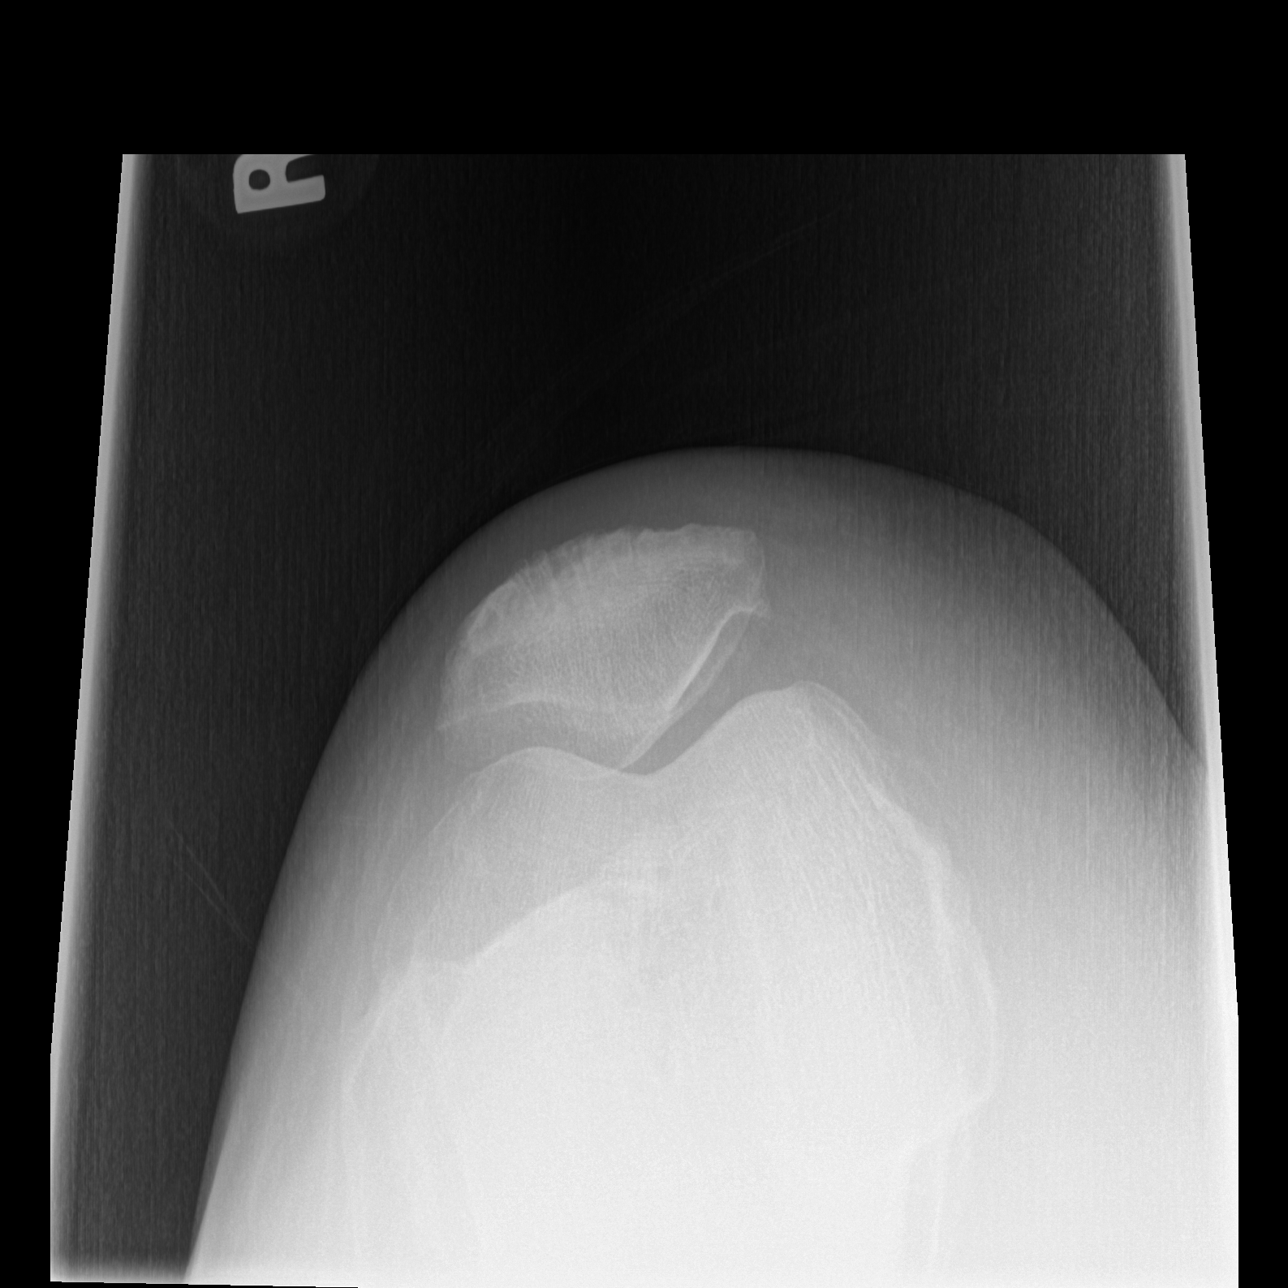

[3 of 3 positions shown; findings below may reference images not displayed]

FINDINGS: Small knee joint effusion. Tricompartment degenerative change. No
acute bony abnormality identified.
IMPRESSION: Small knee joint effusion. Tricompartment degenerative change. No
acute bony abnormality.

## 2018-04-16 IMAGING — CR DG SHOULDER 2+V*L*
2 series · 2 of 2 positions shown · non-contrast
Comparison: 05/24/2013

CLINICAL DATA: Left shoulder pain for 1 year

EXAM:
LEFT SHOULDER - 2+ VIEW

[w shoulder grashey left *]
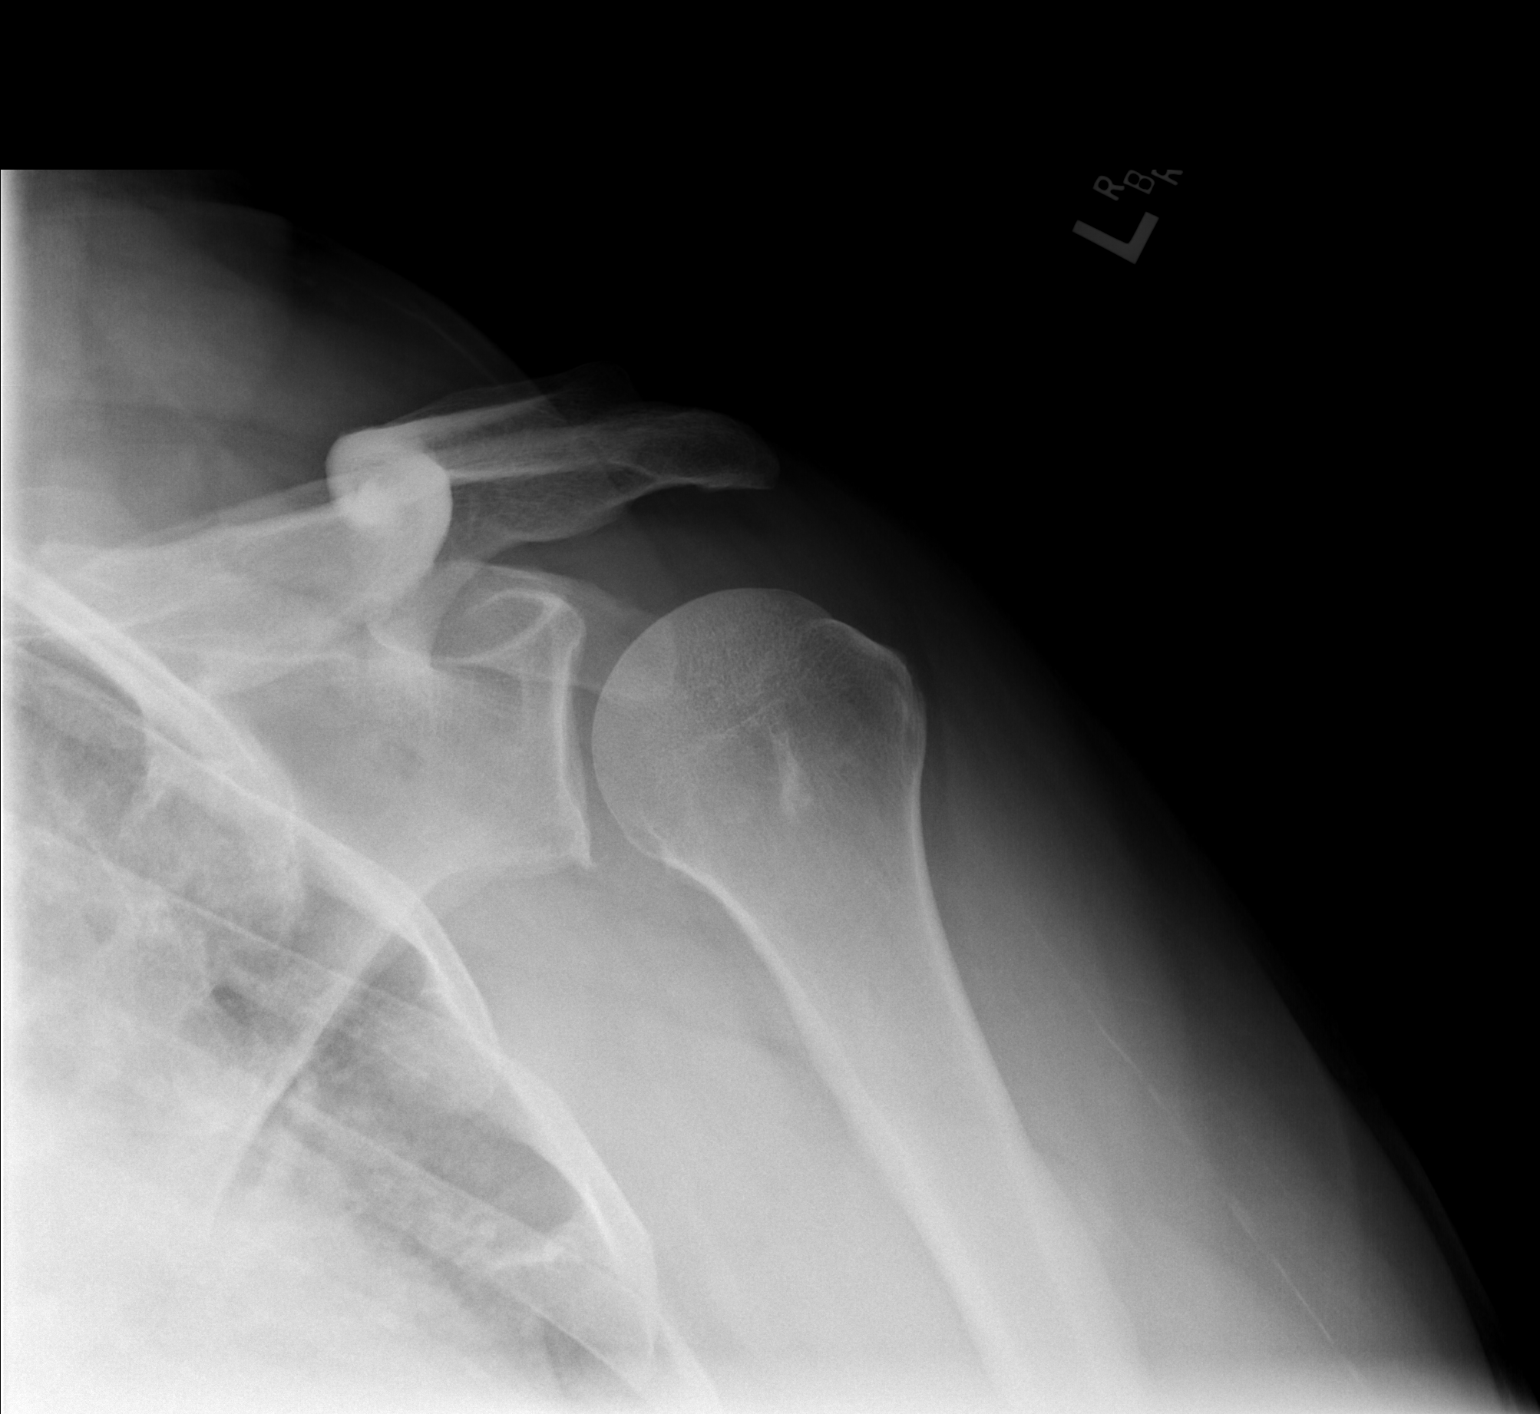

[w shoulder y view left *]
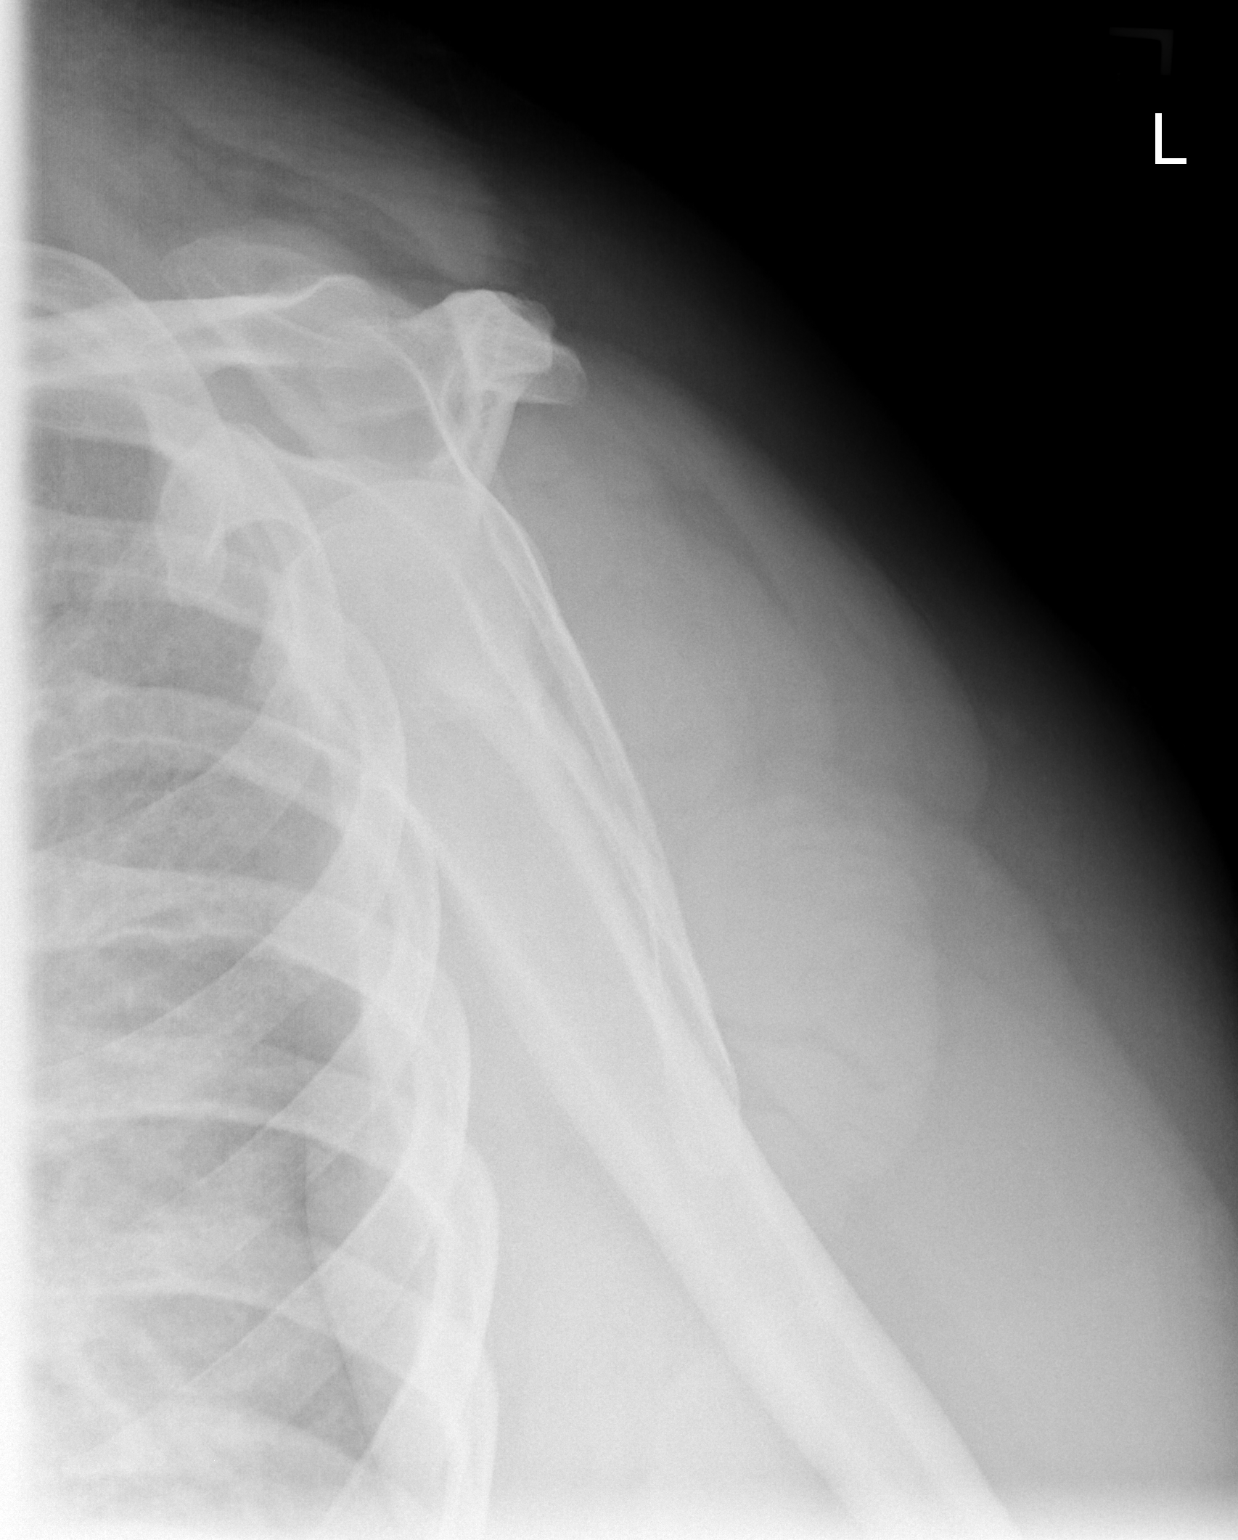

[2 of 2 positions shown; findings below may reference images not displayed]

FINDINGS: No acute bony abnormality. Specifically, no fracture, subluxation,
or dislocation. Soft tissues are intact.
IMPRESSION: No acute bony abnormality.

## 2018-04-17 MED FILL — OXYCODONE-APAP 10-325: 10-325 | 30 days supply | Qty: 120 | Fill #0

## 2018-04-17 MED FILL — BELBUCA 450 MCG FILM: 450 | 30 days supply | Qty: 60 | Fill #0

## 2018-04-22 ENCOUNTER — Other Ambulatory Visit: Payer: Self-pay | Admitting: Internal Medicine

## 2018-04-22 DIAGNOSIS — G4733 Obstructive sleep apnea (adult) (pediatric): Secondary | ICD-10-CM | POA: Diagnosis not present

## 2018-04-22 DIAGNOSIS — R0902 Hypoxemia: Secondary | ICD-10-CM | POA: Diagnosis not present

## 2018-04-22 MED FILL — METOPROLOL TARTRATE 50 MG T: 50 | 30 days supply | Qty: 60 | Fill #0

## 2018-04-22 NOTE — Telephone Encounter (Signed)
Rx sent to pharmacy   

## 2018-05-07 ENCOUNTER — Ambulatory Visit: Payer: 59 | Admitting: Endocrinology

## 2018-05-11 DIAGNOSIS — Z79899 Other long term (current) drug therapy: Secondary | ICD-10-CM | POA: Diagnosis not present

## 2018-05-11 DIAGNOSIS — G894 Chronic pain syndrome: Secondary | ICD-10-CM | POA: Diagnosis not present

## 2018-05-11 DIAGNOSIS — M5136 Other intervertebral disc degeneration, lumbar region: Secondary | ICD-10-CM | POA: Diagnosis not present

## 2018-05-11 DIAGNOSIS — M1990 Unspecified osteoarthritis, unspecified site: Secondary | ICD-10-CM | POA: Diagnosis not present

## 2018-05-11 MED FILL — GABAPENTIN 300 MG CAPSULE: 300 | 30 days supply | Qty: 90 | Fill #0

## 2018-05-19 MED FILL — LOSARTAN POTASSIUM 50 MG TA: 50 | 90 days supply | Qty: 90 | Fill #1

## 2018-05-19 MED FILL — BELBUCA 450 MCG FILM: 450 | 30 days supply | Qty: 60 | Fill #0

## 2018-05-19 MED FILL — METOPROLOL TARTRATE 50 MG T: 50 | 90 days supply | Qty: 180 | Fill #1

## 2018-05-20 MED FILL — OXYCODONE-APAP 10-325: 10-325 | 30 days supply | Qty: 120 | Fill #0

## 2018-05-23 DIAGNOSIS — R0902 Hypoxemia: Secondary | ICD-10-CM | POA: Diagnosis not present

## 2018-05-23 DIAGNOSIS — G4733 Obstructive sleep apnea (adult) (pediatric): Secondary | ICD-10-CM | POA: Diagnosis not present

## 2018-06-05 ENCOUNTER — Emergency Department (HOSPITAL_COMMUNITY): Payer: 59

## 2018-06-05 ENCOUNTER — Other Ambulatory Visit: Payer: Self-pay

## 2018-06-05 ENCOUNTER — Encounter (HOSPITAL_COMMUNITY): Payer: Self-pay | Admitting: Emergency Medicine

## 2018-06-05 ENCOUNTER — Emergency Department (HOSPITAL_COMMUNITY)
Admission: EM | Admit: 2018-06-05 | Discharge: 2018-06-06 | Disposition: A | Discharge status: Home / Self Care | Payer: 59 | Attending: Emergency Medicine | Admitting: Emergency Medicine

## 2018-06-05 DIAGNOSIS — J189 Pneumonia, unspecified organism: Secondary | ICD-10-CM | POA: Diagnosis not present

## 2018-06-05 DIAGNOSIS — R0989 Other specified symptoms and signs involving the circulatory and respiratory systems: Secondary | ICD-10-CM | POA: Diagnosis not present

## 2018-06-05 DIAGNOSIS — R0603 Acute respiratory distress: Secondary | ICD-10-CM | POA: Diagnosis not present

## 2018-06-05 DIAGNOSIS — Z5321 Procedure and treatment not carried out due to patient leaving prior to being seen by health care provider: Secondary | ICD-10-CM | POA: Insufficient documentation

## 2018-06-05 DIAGNOSIS — J9801 Acute bronchospasm: Secondary | ICD-10-CM | POA: Diagnosis not present

## 2018-06-05 DIAGNOSIS — R079 Chest pain, unspecified: Secondary | ICD-10-CM | POA: Insufficient documentation

## 2018-06-05 DIAGNOSIS — M7989 Other specified soft tissue disorders: Secondary | ICD-10-CM | POA: Diagnosis not present

## 2018-06-05 DIAGNOSIS — R0902 Hypoxemia: Secondary | ICD-10-CM | POA: Diagnosis not present

## 2018-06-05 DIAGNOSIS — I50810 Right heart failure, unspecified: Secondary | ICD-10-CM | POA: Diagnosis not present

## 2018-06-05 DIAGNOSIS — R0602 Shortness of breath: Secondary | ICD-10-CM | POA: Diagnosis not present

## 2018-06-05 DIAGNOSIS — M79671 Pain in right foot: Secondary | ICD-10-CM | POA: Diagnosis not present

## 2018-06-05 LAB — CBC
HCT: 44.9 % (ref 39.0–52.0)
Hemoglobin: 13.5 g/dL (ref 13.0–17.0)
MCH: 26.8 pg (ref 26.0–34.0)
MCHC: 30.1 g/dL (ref 30.0–36.0)
MCV: 89.1 fL (ref 78.0–100.0)
Platelets: 196 10*3/uL (ref 150–400)
RBC: 5.04 MIL/uL (ref 4.22–5.81)
RDW: 14.6 % (ref 11.5–15.5)
WBC: 11.5 10*3/uL — ABNORMAL HIGH (ref 4.0–10.5)

## 2018-06-05 LAB — BASIC METABOLIC PANEL
Anion gap: 8 (ref 5–15)
BUN: 7 mg/dL (ref 6–20)
CO2: 26 mmol/L (ref 22–32)
CREATININE: 0.73 mg/dL (ref 0.61–1.24)
Calcium: 10.5 mg/dL — ABNORMAL HIGH (ref 8.9–10.3)
Chloride: 102 mmol/L (ref 98–111)
GFR calc Af Amer: >60 mL/min (ref >60)
GLUCOSE: 114 mg/dL — AB (ref 70–99)
POTASSIUM: 4.4 mmol/L (ref 3.5–5.1)
Sodium: 136 mmol/L (ref 135–145)

## 2018-06-05 LAB — BRAIN NATRIURETIC PEPTIDE: B Natriuretic Peptide: 571.6 pg/mL — ABNORMAL HIGH (ref 0.0–100.0)

## 2018-06-05 LAB — I-STAT TROPONIN, ED: Troponin i, poc: 0 ng/mL (ref 0.00–0.08)

## 2018-06-05 NOTE — ED Triage Notes (Addendum)
Pt reports 10/10 central cp radiating to back and sob upon exertion that started today upon waking up. Reports nausea and non productive cough. Pt has rt foot swelling and 6/10 pain that started last week. PMS intact. Denies injuries but requesting Xray.  Hx of HTN and is compliant with medications.

## 2018-06-06 ENCOUNTER — Encounter (HOSPITAL_BASED_OUTPATIENT_CLINIC_OR_DEPARTMENT_OTHER): Payer: Self-pay | Admitting: Emergency Medicine

## 2018-06-06 ENCOUNTER — Emergency Department (HOSPITAL_BASED_OUTPATIENT_CLINIC_OR_DEPARTMENT_OTHER)
Admission: EM | Admit: 2018-06-06 | Discharge: 2018-06-06 | Disposition: A | Discharge status: Home / Self Care | Payer: 59 | Source: Home / Self Care | Attending: Emergency Medicine | Admitting: Emergency Medicine

## 2018-06-06 ENCOUNTER — Other Ambulatory Visit: Payer: Self-pay

## 2018-06-06 DIAGNOSIS — R0602 Shortness of breath: Secondary | ICD-10-CM

## 2018-06-06 DIAGNOSIS — R079 Chest pain, unspecified: Secondary | ICD-10-CM | POA: Diagnosis not present

## 2018-06-06 DIAGNOSIS — J9801 Acute bronchospasm: Secondary | ICD-10-CM | POA: Insufficient documentation

## 2018-06-06 DIAGNOSIS — Z7901 Long term (current) use of anticoagulants: Secondary | ICD-10-CM | POA: Insufficient documentation

## 2018-06-06 DIAGNOSIS — Z79899 Other long term (current) drug therapy: Secondary | ICD-10-CM | POA: Insufficient documentation

## 2018-06-06 DIAGNOSIS — I10 Essential (primary) hypertension: Secondary | ICD-10-CM | POA: Insufficient documentation

## 2018-06-06 DIAGNOSIS — Z5321 Procedure and treatment not carried out due to patient leaving prior to being seen by health care provider: Secondary | ICD-10-CM | POA: Diagnosis not present

## 2018-06-06 LAB — TROPONIN I: Troponin I: <0.03 ng/mL

## 2018-06-06 MED ORDER — FUROSEMIDE 10 MG/ML IJ SOLN
20.0000 mg | Freq: Once | INTRAMUSCULAR | Status: AC
  Administered 2018-06-06: 20 mg via INTRAVENOUS
  Filled 2018-06-06: qty 2

## 2018-06-06 MED ORDER — FUROSEMIDE 20 MG PO TABS: ORAL_TABLET | ORAL | 0 refills | Status: DC

## 2018-06-06 MED ORDER — IPRATROPIUM-ALBUTEROL 0.5-2.5 (3) MG/3ML IN SOLN
3.0000 mL | RESPIRATORY_TRACT | Status: DC
  Administered 2018-06-06: 3 mL via RESPIRATORY_TRACT
  Filled 2018-06-06: qty 3

## 2018-06-06 MED ORDER — ALBUTEROL SULFATE HFA 108 (90 BASE) MCG/ACT IN AERS
2.0000 | INHALATION_SPRAY | RESPIRATORY_TRACT | Status: DC | PRN
  Filled 2018-06-06: qty 6.7

## 2018-06-06 NOTE — ED Notes (Signed)
Pt on monitor 

## 2018-06-06 NOTE — ED Provider Notes (Signed)
[facility] Provider Note: [name], MD, [name]  CSN: [id number] MRN: [id number] ARRIVAL: 06/06/18 at 0204 ROOM: MH08/MH08   CHIEF COMPLAINT  Shortness of Breath   HISTORY OF PRESENT ILLNESS  06/06/18 2:14 AM Charles Barnett is a 52 y.o. male with a history of morbid obesity and hypertension.  He is here with a 2-day history of shortness of breath.  The shortness of breath is moderate to severe and occurs with exertion, even mild exertion.  It has been associated with a cough as well.  When he gets especially short of breath he has chest discomfort which he states lasts about 30 seconds until he can catch his breath.  He has had no persistent chest pain.  He was seen by his PCP yesterday who suspected pneumonia and [facility].  He waited for hours in the ED but was not seen.  He did have lab work and other diagnostic studies done while waiting.  He denies chest pain at the present time.   Past Medical History:  Diagnosis Date  . Arthritis   . Atrial flutter (HCC) 12/2016  . Hypertension   . Neuromuscular disorder (HCC)   . Sleep apnea     Past Surgical History:  Procedure Laterality Date  . CARDIOVERSION N/A 01/30/2017   Procedure: CARDIOVERSION;  Surgeon: Wendall StadeNishan, Peter C, MD;  [facility];  Service: Cardiovascular;  Laterality: N/A;  . SPINE SURGERY      Family History  Problem Relation Age of Onset  . Diabetes Mother   . Hyperlipidemia Mother   . Hypertension Mother   . Heart disease Mother   . Diabetes Sister   . Hypertension Sister   . Heart disease Sister   . Hyperparathyroidism Neg Hx     Social History   Tobacco Use  . Smoking status: Never Smoker  . Smokeless tobacco: Never Used  Substance Use Topics  . Alcohol use: No    Alcohol/week: 0.0 standard drinks  . Drug use: No    Prior to Admission medications   Medication Sig Start Date End Date Taking? Authorizing Provider  gabapentin (NEURONTIN) 300 MG  capsule Take 300 mg by mouth 3 (three) times daily.   Yes [provider]  cetirizine (ZYRTEC) 10 MG tablet Take 1 tablet (10 mg total) by mouth daily. 01/15/17   Trena PlattEnglish, Stephanie D, PA  losartan (COZAAR) 50 MG tablet Take 1 tablet (50 mg total) by mouth daily. 02/17/18 05/18/18  Chrystie NoseHilty, Kenneth C, MD  metoprolol tartrate (LOPRESSOR) 50 MG tablet TAKE ONE TABLET BY MOUTH 2 TIMES A DAY 04/22/18   Hilty, Lisette AbuKenneth C, MD  oxyCODONE-acetaminophen (PERCOCET) 10-325 MG tablet Take 1 tablet by mouth every 6 (six) hours as needed for pain.  10/15/16   [provider]  OXYCONTIN 20 MG 12 hr tablet Take 1 tablet by mouth 2 (two) times daily. 01/20/17   [provider]  tiZANidine (ZANAFLEX) 4 MG tablet Take 4 mg by mouth 3 (three) times daily as needed for muscle spasms.     [provider]  XARELTO 20 MG TABS tablet TAKE 1 TABLET (20 MG TOTAL) BY MOUTH DAILY WITH SUPPER. 03/18/18   Hilty, Lisette AbuKenneth C, MD    Allergies Ibuprofen; Iodine; and Iodinated diagnostic agents   REVIEW OF SYSTEMS  Negative except as noted here or in the History of Present Illness.   PHYSICAL EXAMINATION  Initial Vital Signs There were no vitals taken for this visit.  Examination General: Well-developed, poorly obese male in no acute distress; appearance consistent with age of record HENT: normocephalic; atraumatic Eyes: pupils equal, round and reactive to light; extraocular muscles intact Neck: supple Heart: regular rate and rhythm; no murmur Lungs: Faint inspiratory and expiratory wheezes bilaterally Abdomen: soft; nondistended; nontender; bowel sounds present Extremities: No deformity; full range of motion; pulses normal; trace edema of lower legs Neurologic: Awake, alert and oriented; motor function intact in all extremities and symmetric; no facial droop Skin: Warm and dry Psychiatric: Normal mood and affect   RESULTS  Summary of this visit's results, reviewed by myself:   EKG  Interpretation  Date/Time:  Saturday June 06 2018 02:19:38 EDT Ventricular Rate:  66 PR Interval:    QRS Duration: 111 QT Interval:  432 QTC Calculation: 453 R Axis:   -79 Text Interpretation:  Sinus rhythm Prolonged PR interval Consider left atrial enlargement Consider anterior infarct No significant change was found Confirmed by Paula Libra ([id number]) on 06/06/2018 2:30:38 AM      Laboratory Studies: Results for orders placed or performed during the hospital encounter of 06/05/18 (from the past 24 hour(s))  Basic metabolic panel     Status: Abnormal   Collection Time: 06/05/18  7:24 PM  Result Value Ref Range   Sodium 136 135 - 145 mmol/L   Potassium 4.4 3.5 - 5.1 mmol/L   Chloride 102 98 - 111 mmol/L   CO2 26 22 - 32 mmol/L   Glucose, Bld 114 (H) 70 - 99 mg/dL   BUN 7 6 - 20 mg/dL   Creatinine, Ser 6.04 0.61 - 1.24 mg/dL   Calcium 54.0 (H) 8.9 - 10.3 mg/dL   GFR calc non Af Amer >60 >60 mL/min   GFR calc Af Amer >60 >60 mL/min   Anion gap 8 5 - 15  CBC     Status: Abnormal   Collection Time: 06/05/18  7:24 PM  Result Value Ref Range   WBC 11.5 (H) 4.0 - 10.5 K/uL   RBC 5.04 4.22 - 5.81 MIL/uL   Hemoglobin 13.5 13.0 - 17.0 g/dL   HCT 98.1 19.1 - 47.8 %   MCV 89.1 78.0 - 100.0 fL   MCH 26.8 26.0 - 34.0 pg   MCHC 30.1 30.0 - 36.0 g/dL   RDW 29.5 62.1 - 30.8 %   Platelets 196 150 - 400 K/uL  Brain natriuretic peptide     Status: Abnormal   Collection Time: 06/05/18  7:24 PM  Result Value Ref Range   B Natriuretic Peptide 571.6 (H) 0.0 - 100.0 pg/mL  I-stat troponin, ED     Status: None   Collection Time: 06/05/18  7:38 PM  Result Value Ref Range   Troponin i, poc 0.00 0.00 - 0.08 ng/mL   Comment 3           Nursing notes and vitals signs, including pulse oximetry, reviewed.  Summary of this visit's results, reviewed by myself:  EKG:   Labs:  Results for orders placed or performed during the hospital encounter of 06/06/18 (from the past 24 hour(s))    Troponin I     Status: None   Collection Time: 06/06/18  2:36 AM  Result Value Ref Range   Troponin I <0.03 <0.03 ng/mL   Imaging Studies: Dg Chest 2 View  Result Date: 06/05/2018 CLINICAL DATA:  Central chest pain radiating to the back with dyspnea on exertion today. EXAM: CHEST - 2 VIEW COMPARISON:  01/07/2017 FINDINGS: Pulmonary vascular congestion is  noted, accentuated since prior. No pneumothorax nor alveolar consolidations. Stable cardiomegaly with minimal aortic atherosclerosis. Neural stimulator leads project up to the T6 level. No acute osseous abnormality. Probable small hiatal hernia. IMPRESSION: 1. Stable cardiomegaly with pulmonary vascular congestion. 2. Nonaneurysmal thoracic aorta with minimal aortic atherosclerosis. 3. Mild hiatal hernia suspected. 4. Neural stimulator lead projects up to the T6 level. Electronically Signed   By: Tollie Eth M.D.   On: 06/05/2018 19:59   Dg Foot Complete Right  Result Date: 06/05/2018 CLINICAL DATA:  Right foot pain EXAM: RIGHT FOOT COMPLETE - 3+ VIEW COMPARISON:  None. FINDINGS: Normal bone mineralization. No marginal or extra-articular erosions. Mild degenerative joint space narrowing of the DIP joints of the second through fifth digits, interphalangeal joint of the great toe and to a lesser degree the first MTP. Accessory ossicle is seen adjacent to the cuboid. Mild degenerative joint space narrowing and sclerosis about the base of the metatarsals. Midfoot articulations are otherwise intact. No joint effusions. Mild dorsal soft tissue swelling of the foot, nonspecific. Calcaneal enthesopathy is noted along the plantar dorsal aspect. No evidence of periostitis or stress reaction. IMPRESSION: 1. Generalized soft tissue swelling of the foot. 2. No acute osseous abnormality.  No joint dislocation or effusion. 3. Calcaneal enthesopathy. Electronically Signed   By: Tollie Eth M.D.   On: 06/05/2018 20:00    ED COURSE and MDM  Nursing notes and initial  vitals signs, including pulse oximetry, reviewed.  Vitals:   06/06/18 0213 06/06/18 0215 06/06/18 0218 06/06/18 0238  BP: (!) 156/91  (!) 156/91   Pulse: 74  70   Resp: 20  (!) 24   Temp:   98.2 F (36.8 C)   TempSrc:   Oral   SpO2: 93%  96% 94%  Weight:  (!) 196 kg    Height:  6' (1.829 m)     2:51 AM Wheezing resolved after DuoNeb treatment and patient feels better.  Brisk diuresis after Lasix 20 mg IV.  3:18 AM Able to ambulate without becoming dyspneic or hypoxic.  I suspect his symptoms are combination of bronchospasm and mild CHF.  We will provide him with an albuterol inhaler and a prescription for Lasix and have him follow-up with his primary care physician.  PROCEDURES    ED DIAGNOSES     ICD-10-CM   1. Acute bronchospasm J98.01   2. Shortness of breath R06.02        Maleigh Bagot, Jonny Ruiz, MD 06/06/18 [phone]

## 2018-06-06 NOTE — ED Triage Notes (Signed)
Patient presents with co shortness of breath and chest pain mid sternal and lower back pain; [facility] and left prior to being seen. States onset of sx 2 days ago.

## 2018-06-06 NOTE — ED Notes (Signed)
Pt ambulated with his cane without at issues, SpO2 93-94 and heart rate 87-90.

## 2018-06-06 NOTE — ED Notes (Signed)
Wife stated that pt was leaving to go to [facility].

## 2018-06-08 MED FILL — FUROSEMIDE 20 MG TAB: 20 | 7 days supply | Qty: 7 | Fill #0

## 2018-06-11 DIAGNOSIS — M5136 Other intervertebral disc degeneration, lumbar region: Secondary | ICD-10-CM | POA: Diagnosis not present

## 2018-06-11 DIAGNOSIS — M1990 Unspecified osteoarthritis, unspecified site: Secondary | ICD-10-CM | POA: Diagnosis not present

## 2018-06-11 DIAGNOSIS — G894 Chronic pain syndrome: Secondary | ICD-10-CM | POA: Diagnosis not present

## 2018-06-11 DIAGNOSIS — Z79899 Other long term (current) drug therapy: Secondary | ICD-10-CM | POA: Diagnosis not present

## 2018-06-17 MED FILL — XARELTO 20 MG TABLET: 20 | 90 days supply | Qty: 90 | Fill #1

## 2018-06-19 MED FILL — OXYCODONE-APAP 10-325: 10-325 | 30 days supply | Qty: 120 | Fill #0

## 2018-06-19 MED FILL — BELBUCA 450 MCG FILM: 450 | 30 days supply | Qty: 60 | Fill #0

## 2018-06-22 DIAGNOSIS — G4733 Obstructive sleep apnea (adult) (pediatric): Secondary | ICD-10-CM | POA: Diagnosis not present

## 2018-06-22 DIAGNOSIS — R0902 Hypoxemia: Secondary | ICD-10-CM | POA: Diagnosis not present

## 2018-07-06 IMAGING — CR DG CHEST 2V
2 series · 2 of 2 positions shown · non-contrast
Comparison: No prior.

CLINICAL DATA: Cough and congestion.

EXAM:
CHEST  2 VIEW

[chest pa]
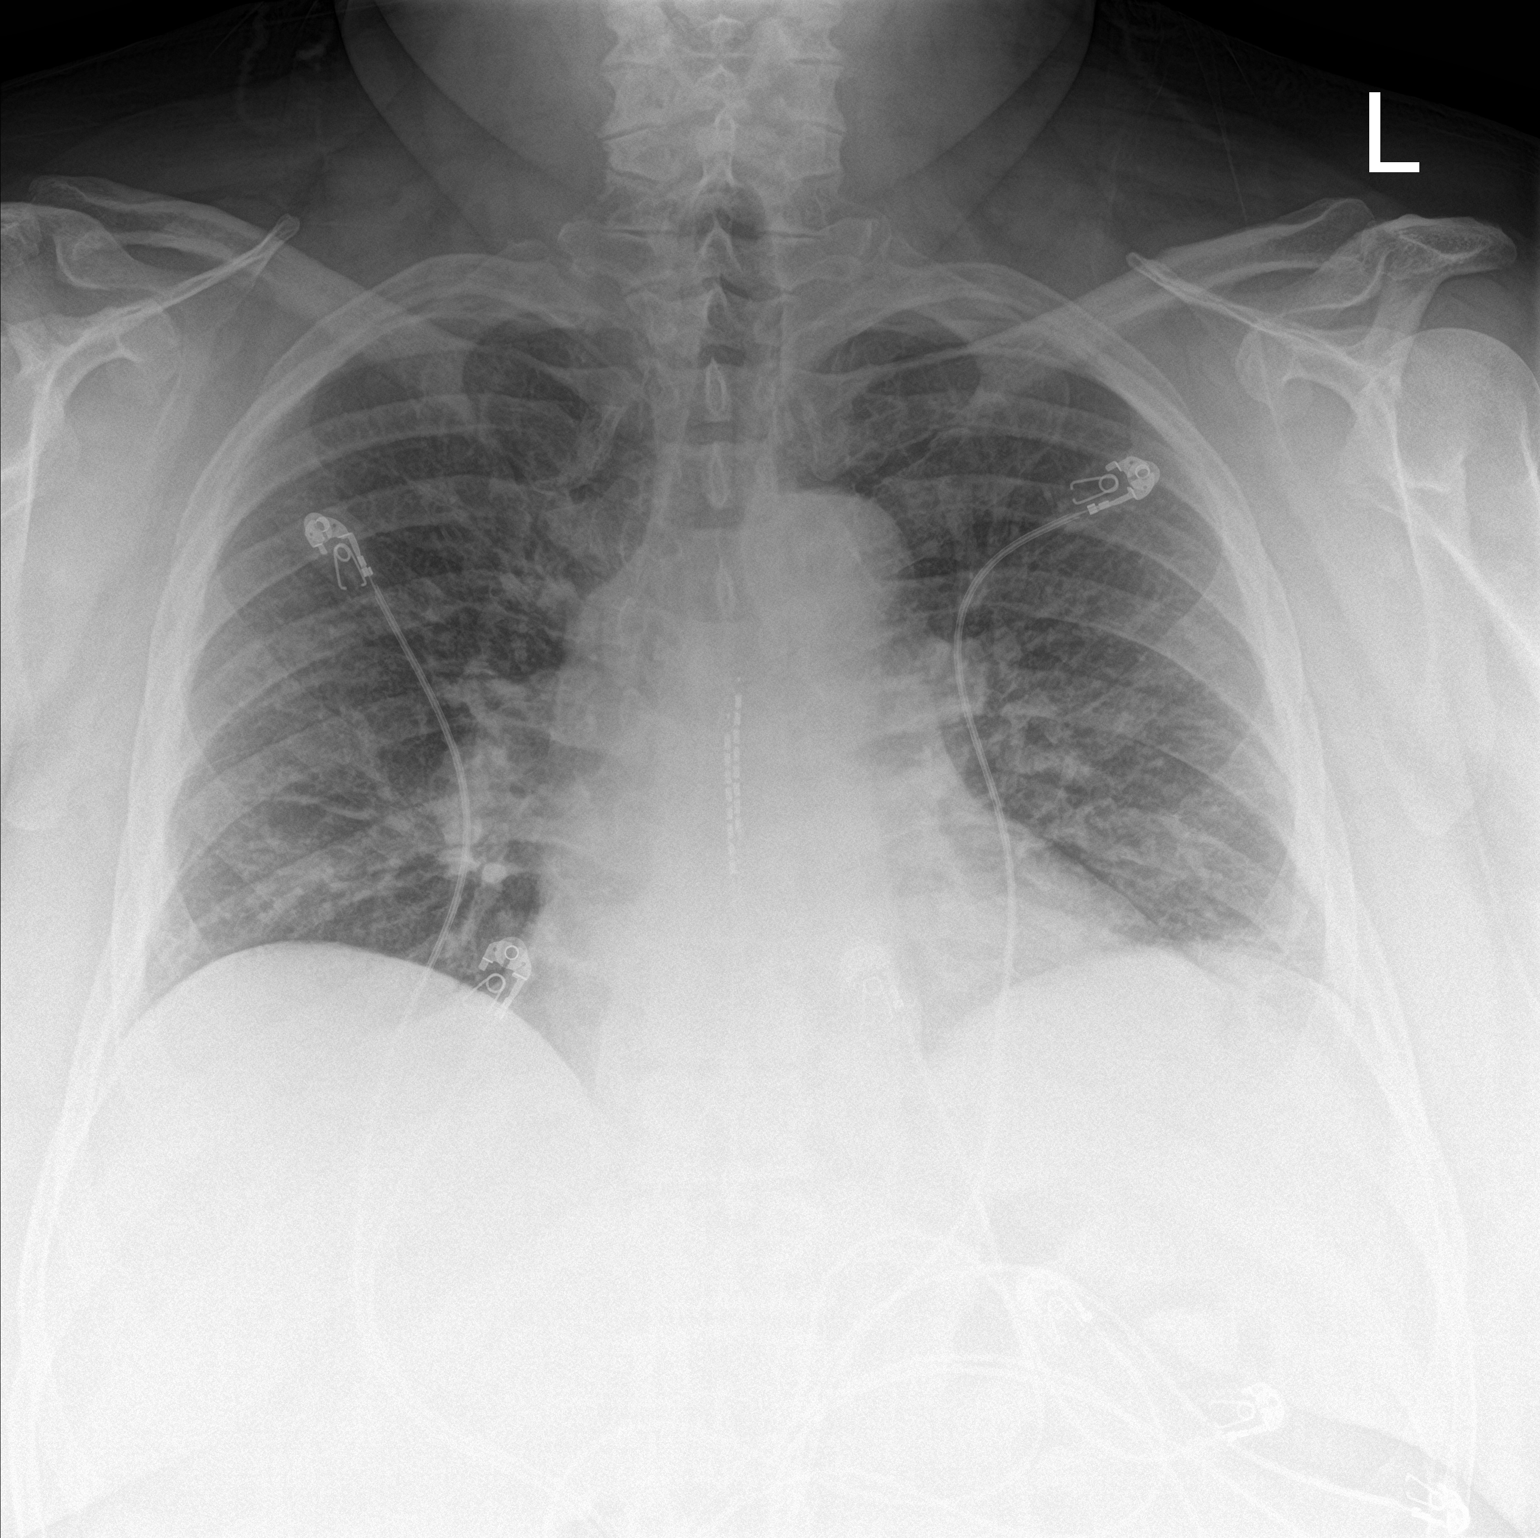

[chest lat]
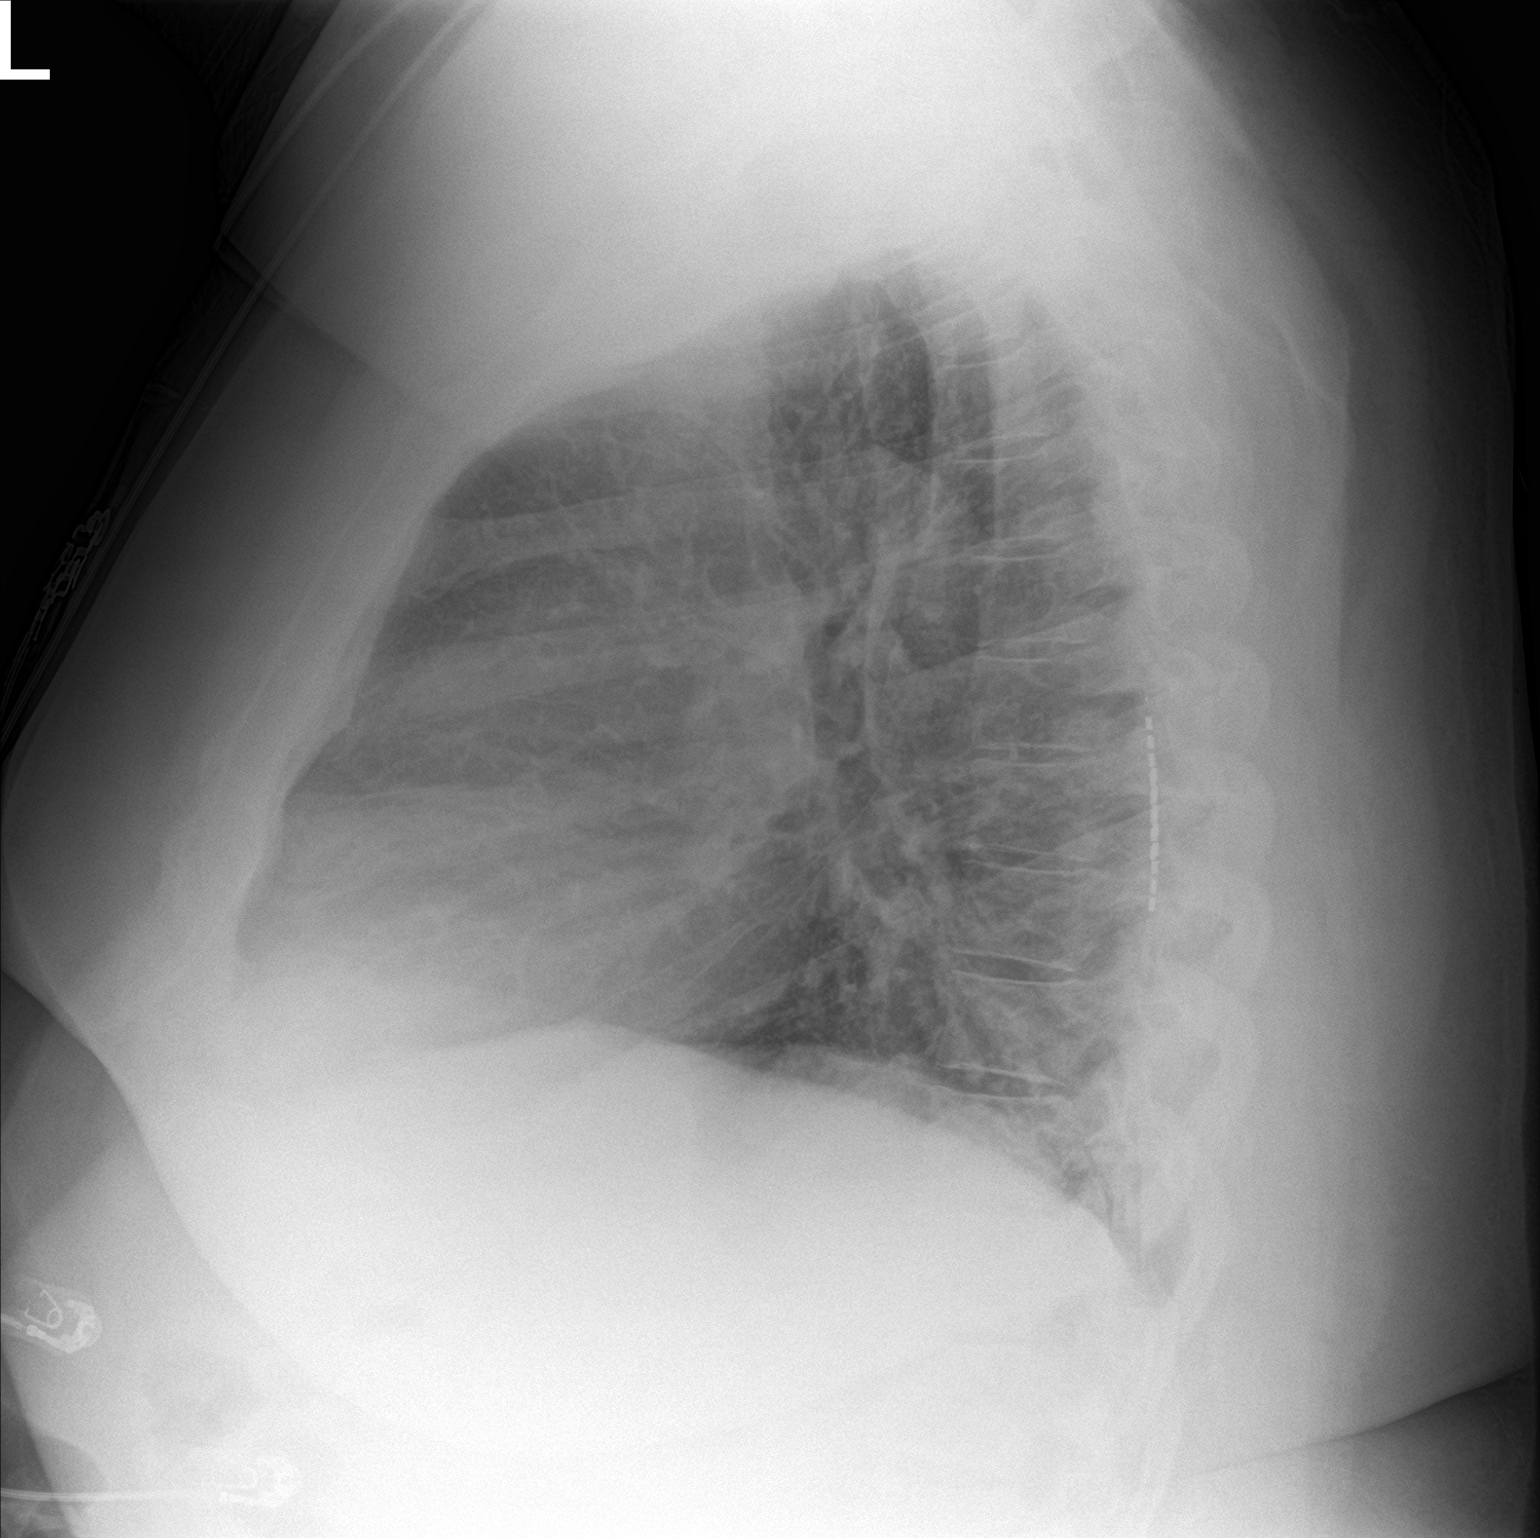

[2 of 2 positions shown; findings below may reference images not displayed]

FINDINGS: Cardiomegaly with mild pulmonary interstitial prominence. Mild
interstitial pneumonitis cannot be excluded. Low lung volumes. No
pleural effusion pneumothorax. Neurostimulator noted projected over
the thoracic spine.
IMPRESSION: 1. Low lung volumes.

2. Mild cardiomegaly and mild interstitial prominence. Mild
interstitial edema and/or pneumonitis cannot be excluded.

## 2018-07-16 DIAGNOSIS — Z79899 Other long term (current) drug therapy: Secondary | ICD-10-CM | POA: Diagnosis not present

## 2018-07-16 DIAGNOSIS — G894 Chronic pain syndrome: Secondary | ICD-10-CM | POA: Diagnosis not present

## 2018-07-16 DIAGNOSIS — M5136 Other intervertebral disc degeneration, lumbar region: Secondary | ICD-10-CM | POA: Diagnosis not present

## 2018-07-20 MED FILL — OXYCODONE-APAP 10-325: 10-325 | 30 days supply | Qty: 120 | Fill #0

## 2018-07-20 MED FILL — BELBUCA 450 MCG FILM: 450 | 30 days supply | Qty: 60 | Fill #0

## 2018-07-23 DIAGNOSIS — R0902 Hypoxemia: Secondary | ICD-10-CM | POA: Diagnosis not present

## 2018-07-23 DIAGNOSIS — G4733 Obstructive sleep apnea (adult) (pediatric): Secondary | ICD-10-CM | POA: Diagnosis not present

## 2018-08-17 MED FILL — LOSARTAN POTASSIUM 50 MG TA: 50 | 90 days supply | Qty: 90 | Fill #2

## 2018-08-17 MED FILL — METOPROLOL TARTRATE 50 MG T: 50 | 90 days supply | Qty: 180 | Fill #2

## 2018-08-17 MED FILL — OXYCODONE-APAP 10-325: 10-325 | 30 days supply | Qty: 120 | Fill #0

## 2018-08-22 DIAGNOSIS — G4733 Obstructive sleep apnea (adult) (pediatric): Secondary | ICD-10-CM | POA: Diagnosis not present

## 2018-08-22 DIAGNOSIS — R0902 Hypoxemia: Secondary | ICD-10-CM | POA: Diagnosis not present

## 2018-08-24 MED FILL — BELBUCA 450 MCG FILM: 450 | 30 days supply | Qty: 60 | Fill #0

## 2018-09-10 DIAGNOSIS — Z79899 Other long term (current) drug therapy: Secondary | ICD-10-CM | POA: Diagnosis not present

## 2018-09-10 DIAGNOSIS — M1990 Unspecified osteoarthritis, unspecified site: Secondary | ICD-10-CM | POA: Diagnosis not present

## 2018-09-10 DIAGNOSIS — M5136 Other intervertebral disc degeneration, lumbar region: Secondary | ICD-10-CM | POA: Diagnosis not present

## 2018-09-15 MED FILL — OXYCODONE-APAP 10-325: 10-325 | 30 days supply | Qty: 120 | Fill #0

## 2018-09-22 DIAGNOSIS — G4733 Obstructive sleep apnea (adult) (pediatric): Secondary | ICD-10-CM | POA: Diagnosis not present

## 2018-09-22 DIAGNOSIS — R0902 Hypoxemia: Secondary | ICD-10-CM | POA: Diagnosis not present

## 2018-09-23 ENCOUNTER — Other Ambulatory Visit: Payer: Self-pay | Admitting: Internal Medicine

## 2018-09-23 MED FILL — XARELTO 20 MG TABLET: 20 | 90 days supply | Qty: 90 | Fill #0

## 2018-09-23 MED FILL — BELBUCA 600 MCG FILM: 600 | 30 days supply | Qty: 60 | Fill #0

## 2018-10-16 MED FILL — OXYCODONE-APAP 10-325: 10-325 | 30 days supply | Qty: 120 | Fill #0

## 2018-10-23 DIAGNOSIS — G4733 Obstructive sleep apnea (adult) (pediatric): Secondary | ICD-10-CM | POA: Diagnosis not present

## 2018-10-23 DIAGNOSIS — R0902 Hypoxemia: Secondary | ICD-10-CM | POA: Diagnosis not present

## 2018-10-23 MED FILL — BELBUCA 600 MCG FILM: 600 | 30 days supply | Qty: 60 | Fill #0

## 2018-11-09 DIAGNOSIS — G894 Chronic pain syndrome: Secondary | ICD-10-CM | POA: Diagnosis not present

## 2018-11-09 DIAGNOSIS — Z79899 Other long term (current) drug therapy: Secondary | ICD-10-CM | POA: Diagnosis not present

## 2018-11-09 DIAGNOSIS — M5136 Other intervertebral disc degeneration, lumbar region: Secondary | ICD-10-CM | POA: Diagnosis not present

## 2018-11-09 DIAGNOSIS — F1120 Opioid dependence, uncomplicated: Secondary | ICD-10-CM | POA: Diagnosis not present

## 2018-11-16 ENCOUNTER — Other Ambulatory Visit: Payer: Self-pay | Admitting: Internal Medicine

## 2018-11-16 MED FILL — OXYCODONE-APAP 10-325: 10-325 | 30 days supply | Qty: 90 | Fill #0

## 2018-11-16 MED FILL — LOSARTAN POTASSIUM 50 MG TA: 50 | 90 days supply | Qty: 90 | Fill #3

## 2018-11-17 MED FILL — METOPROLOL TARTRATE 50 MG T: 50 | 30 days supply | Qty: 60 | Fill #0

## 2018-11-21 DIAGNOSIS — G4733 Obstructive sleep apnea (adult) (pediatric): Secondary | ICD-10-CM | POA: Diagnosis not present

## 2018-11-21 DIAGNOSIS — R092 Respiratory arrest: Secondary | ICD-10-CM | POA: Diagnosis not present

## 2018-11-23 MED FILL — BELBUCA 600 MCG FILM: 600 | 30 days supply | Qty: 60 | Fill #0

## 2018-12-17 MED FILL — METOPROLOL TARTRATE 50 MG T: 50 | 30 days supply | Qty: 60 | Fill #1

## 2018-12-17 MED FILL — OXYCODONE-APAP 10-325: 10-325 | 30 days supply | Qty: 90 | Fill #0

## 2018-12-22 DIAGNOSIS — R0902 Hypoxemia: Secondary | ICD-10-CM | POA: Diagnosis not present

## 2018-12-22 DIAGNOSIS — G4733 Obstructive sleep apnea (adult) (pediatric): Secondary | ICD-10-CM | POA: Diagnosis not present

## 2018-12-23 MED FILL — XARELTO 20 MG TABLET: 20 | 90 days supply | Qty: 90 | Fill #1

## 2018-12-23 MED FILL — BELBUCA 600 MCG FILM: 600 | 30 days supply | Qty: 60 | Fill #0

## 2019-01-06 DIAGNOSIS — Z79899 Other long term (current) drug therapy: Secondary | ICD-10-CM | POA: Diagnosis not present

## 2019-01-06 DIAGNOSIS — M5136 Other intervertebral disc degeneration, lumbar region: Secondary | ICD-10-CM | POA: Diagnosis not present

## 2019-01-06 DIAGNOSIS — F1120 Opioid dependence, uncomplicated: Secondary | ICD-10-CM | POA: Diagnosis not present

## 2019-01-06 DIAGNOSIS — G894 Chronic pain syndrome: Secondary | ICD-10-CM | POA: Diagnosis not present

## 2019-01-14 MED FILL — METOPROLOL TARTRATE 50 MG T: 50 | 30 days supply | Qty: 60 | Fill #2

## 2019-01-18 MED FILL — OXYCODONE-APAP 10-325: 10-325 | 30 days supply | Qty: 90 | Fill #0

## 2019-01-21 DIAGNOSIS — G4733 Obstructive sleep apnea (adult) (pediatric): Secondary | ICD-10-CM | POA: Diagnosis not present

## 2019-01-21 DIAGNOSIS — R0902 Hypoxemia: Secondary | ICD-10-CM | POA: Diagnosis not present

## 2019-01-22 MED FILL — BELBUCA 600 MCG FILM: 600 | 30 days supply | Qty: 60 | Fill #0

## 2019-01-28 DIAGNOSIS — G4733 Obstructive sleep apnea (adult) (pediatric): Secondary | ICD-10-CM | POA: Diagnosis not present

## 2019-02-17 MED FILL — METOPROLOL TARTRATE 50 MG T: 50 | 30 days supply | Qty: 60 | Fill #3

## 2019-02-19 MED FILL — OXYCODONE-APAP 10-325: 10-325 | 30 days supply | Qty: 90 | Fill #0

## 2019-02-21 DIAGNOSIS — G4733 Obstructive sleep apnea (adult) (pediatric): Secondary | ICD-10-CM | POA: Diagnosis not present

## 2019-02-21 DIAGNOSIS — R0902 Hypoxemia: Secondary | ICD-10-CM | POA: Diagnosis not present

## 2019-02-22 MED FILL — BELBUCA 600 MCG FILM: 600 | 30 days supply | Qty: 60 | Fill #0

## 2019-02-25 MED FILL — LOSARTAN POTASSIUM 50 MG TA: 50 | 90 days supply | Qty: 90 | Fill #0

## 2019-03-09 DIAGNOSIS — G894 Chronic pain syndrome: Secondary | ICD-10-CM | POA: Diagnosis not present

## 2019-03-09 DIAGNOSIS — Z79899 Other long term (current) drug therapy: Secondary | ICD-10-CM | POA: Diagnosis not present

## 2019-03-09 DIAGNOSIS — M5136 Other intervertebral disc degeneration, lumbar region: Secondary | ICD-10-CM | POA: Diagnosis not present

## 2019-03-09 MED FILL — BACLOFEN 10 MG TABS: 10 | 30 days supply | Qty: 90 | Fill #0

## 2019-03-10 ENCOUNTER — Telehealth: Payer: Self-pay | Admitting: Internal Medicine

## 2019-03-10 NOTE — Telephone Encounter (Signed)
smartphone/ consent/ my chart via emailed/ pre reg completed  °

## 2019-03-17 NOTE — Telephone Encounter (Signed)
LVM, reminding pt of his appt with Dr Debara Pickett on 03-18-19.

## 2019-03-18 ENCOUNTER — Encounter: Payer: Self-pay | Admitting: Internal Medicine

## 2019-03-18 ENCOUNTER — Telehealth (INDEPENDENT_AMBULATORY_CARE_PROVIDER_SITE_OTHER): Payer: 59 | Admitting: Internal Medicine

## 2019-03-18 ENCOUNTER — Other Ambulatory Visit: Payer: Self-pay | Admitting: Internal Medicine

## 2019-03-18 VITALS — BP 138/86 | Ht 72.0 in | Wt >= 6400 oz

## 2019-03-18 DIAGNOSIS — Z6841 Body Mass Index (BMI) 40.0 and over, adult: Secondary | ICD-10-CM

## 2019-03-18 DIAGNOSIS — G4733 Obstructive sleep apnea (adult) (pediatric): Secondary | ICD-10-CM | POA: Diagnosis not present

## 2019-03-18 DIAGNOSIS — I4892 Unspecified atrial flutter: Secondary | ICD-10-CM | POA: Diagnosis not present

## 2019-03-18 DIAGNOSIS — I10 Essential (primary) hypertension: Secondary | ICD-10-CM

## 2019-03-18 MED ORDER — METOPROLOL TARTRATE 50 MG PO TABS: ORAL_TABLET | ORAL | 3 refills | Status: DC

## 2019-03-18 MED ORDER — LOSARTAN POTASSIUM 50 MG PO TABS: 50.0000 mg | ORAL_TABLET | Freq: Every day | ORAL | 3 refills | Status: DC

## 2019-03-18 MED ORDER — RIVAROXABAN 20 MG PO TABS: ORAL_TABLET | ORAL | 3 refills | Status: DC

## 2019-03-18 MED FILL — METOPROLOL TARTRATE 50 MG T: 50 | 90 days supply | Qty: 180 | Fill #0

## 2019-03-18 MED FILL — XARELTO 20 MG TABLET: 20 | 90 days supply | Qty: 90 | Fill #0

## 2019-03-18 NOTE — Patient Instructions (Signed)
Medication Instructions:  Your physician recommends that you continue on your current medications as directed. Please refer to the Current Medication list given to you today.   Follow-Up: At [facility], you and your health needs are our priority.  As part of our continuing mission to provide you with exceptional heart care, we have created designated Provider Care Teams.  These Care Teams include your primary Cardiologist (physician) and Advanced Practice Providers (APPs -  Physician Assistants and Nurse Practitioners) who all work together to provide you with the care you need, when you need it. You will need a follow up appointment in 12 months.  Please call our office 2 months in advance to schedule this appointment.  You may see Dr. Debara Pickett or one of the following Advanced Practice Providers on your designated Care Team: Almyra Deforest, [place] . Fabian Sharp, PA-C

## 2019-03-18 NOTE — Progress Notes (Signed)
Virtual Visit via Telephone Note   This visit type was conducted due to national recommendations for restrictions regarding the COVID-19 Pandemic (e.g. social distancing) in an effort to limit this patient's exposure and mitigate transmission in our community.  Due to his co-morbid illnesses, this patient is at least at moderate risk for complications without adequate follow up.  This format is felt to be most appropriate for this patient at this time.  The patient did not have access to video technology/had technical difficulties with video requiring transitioning to audio format only (telephone).  All issues noted in this document were discussed and addressed.  No physical exam could be performed with this format.  Please refer to the patient's chart for his  consent to telehealth for [facility].   Evaluation Performed:  Telephone follow-up  Date:  03/18/2019   ID:  Charles Barnett, DOB 07-22-1966, MRN [id number]  Patient Location:  [facility] [address]  Provider location:   [address] [place], [place] [address]  PCP:  Hayden Rasmussen, MD  Cardiologist:  No primary care provider on file. Electrophysiologist:  None   Chief Complaint:  No complaints  History of Present Illness:    Charles Barnett is a 53 y.o. male who presents via audio/video conferencing for a telehealth visit today.  Charles Barnett is a 53 year old morbidly obese male with a history of hypertension, obstructive sleep apnea on CPAP and atrial flutter.  He underwent cardioversion in 2018 and has been on Xarelto since then for CHADSVASC score of 1 however given his significant obesity, I felt that aspirin may not be cardioprotective from stroke.  He denies any recurrent palpitations, flutter or other cardiac symptoms such as chest pain or worsening shortness of breath.  Unfortunately is not been able to lose much weight.  He did have some success when being in a pool and was at one  point down to about 330 however now weighs 435 pounds.  Obviously this is a significant stressor on his body we have again discussed options for weight management which are limited now in this setting of a COVID-19 pandemic.  He has limited access to the pool.  He would be possibly a candidate for bariatric surgery if he wished to entertain that.  The patient does not have symptoms concerning for COVID-19 infection (fever, chills, cough, or new SHORTNESS OF BREATH).    Prior CV studies:   The following studies were reviewed today:  Chart review Lab work  PMHx:  Past Medical History:  Diagnosis Date  . Arthritis   . Atrial flutter ([place]) 12/2016  . Hypertension   . Neuromuscular disorder ([place])   . Sleep apnea     Past Surgical History:  Procedure Laterality Date  . CARDIOVERSION N/A 01/30/2017   Procedure: CARDIOVERSION;  Surgeon: Josue Hector, MD;  Location: [facility] ENDOSCOPY;  Service: Cardiovascular;  Laterality: N/A;  . SPINE SURGERY      FAMHx:  Family History  Problem Relation Age of Onset  . Diabetes Mother   . Hyperlipidemia Mother   . Hypertension Mother   . Heart disease Mother   . Diabetes Sister   . Hypertension Sister   . Heart disease Sister   . Hyperparathyroidism Neg Hx     SOCHx:   reports that he has never smoked. He has never used smokeless tobacco. He reports that he does not drink alcohol or use drugs.  ALLERGIES:  Allergies  Allergen Reactions  .  Ibuprofen Other (See Comments)    "bloody stools" Other reaction(s): Other Bloody stool  . Iodine Swelling    Contrast dye  . Iodinated Diagnostic Agents Swelling    MEDS:  Current Meds  Medication Sig  . cetirizine (ZYRTEC) 10 MG tablet Take 1 tablet (10 mg total) by mouth daily.  . furosemide (LASIX) 20 MG tablet Take 1 tablet daily as needed for shortness of breath.  . gabapentin (NEURONTIN) 300 MG capsule Take 300 mg by mouth 3 (three) times daily.  Marland Kitchen. losartan (COZAAR) 50 MG tablet Take 1  tablet (50 mg total) by mouth daily.  . metoprolol tartrate (LOPRESSOR) 50 MG tablet TAKE ONE TABLET BY MOUTH 2 TIMES A DAY  . oxyCODONE-acetaminophen (PERCOCET) 10-325 MG tablet Take 1 tablet by mouth every 6 (six) hours as needed for pain.   Marland Kitchen. XARELTO 20 MG TABS tablet TAKE 1 TABLET (20 MG TOTAL) BY MOUTH DAILY WITH SUPPER.     ROS: Pertinent items noted in HPI and remainder of comprehensive ROS otherwise negative.  Labs/Other Tests and Data Reviewed:    Recent Labs: 06/05/2018: B Natriuretic Peptide 571.6; BUN 7; Creatinine, Ser 0.73; Hemoglobin 13.5; Platelets 196; Potassium 4.4; Sodium 136   Recent Lipid Panel Lab Results  Component Value Date/Time   CHOL 176 08/08/2015 01:48 PM   TRIG 96 08/08/2015 01:48 PM   HDL 43 08/08/2015 01:48 PM   CHOLHDL 4.1 08/08/2015 01:48 PM   LDLCALC 114 08/08/2015 01:48 PM    Wt Readings from Last 3 Encounters:  03/18/19 (!) 435 lb (197.3 kg)  06/06/18 (!) 432 lb (196 kg)  06/05/18 (!) 432 lb (196 kg)     Exam:    Vital Signs:  BP 138/86   Ht 6' (1.829 m)   Wt (!) 435 lb (197.3 kg)   BMI 59.00 kg/m    Exam not performed due to telephone visit  ASSESSMENT & PLAN:    1. Paroxysmal atrial flutter status post DCCV-CHADSVASC score of 1 (2018) 2. Hypertension 3. Super morbid obesity 4. Obstructive sleep apnea on CPAP  Charles Barnett is doing well without any recurrent atrial flutter that he is aware of.  He remains on Xarelto for anticoagulation without any bleeding difficulty.  His blood pressure is now reasonably well controlled.  Unfortunately, weight is still an issue at 435 pounds.  I encouraged him again to consider weight management or evaluation for bariatric surgery.  Unfortunately cannot exercise much now due to restrictions and inability to get into the pool.  He is compliant with CPAP and reports reasonable energy levels with that.  Plan to refill his medications today and follow-up with me annually or sooner as necessary.   COVID-19 Education: The signs and symptoms of COVID-19 were discussed with the patient and how to seek care for testing (follow up with PCP or arrange E-visit).  The importance of social distancing was discussed today.  Patient Risk:   After full review of this patients clinical status, I feel that they are at least moderate risk at this time.  Time:   Today, I have spent 25 minutes with the patient with telehealth technology discussing atrial flutter, hypertension, weight loss and obstructive sleep apnea.     Medication Adjustments/Labs and Tests Ordered: Current medicines are reviewed at length with the patient today.  Concerns regarding medicines are outlined above.   Tests Ordered: No orders of the defined types were placed in this encounter.   Medication Changes: No orders of the defined types  were placed in this encounter.   Disposition:  in 1 year(s)  Chrystie NoseKenneth C. Navarre Diana, MD, [facility] LtdFACC, FACP  [place]  [facility]  Medical Director of the Advanced Lipid Disorders &  Cardiovascular Risk Reduction Clinic Diplomate of the American Board of Clinical Lipidology Attending Cardiologist  Direct Dial: [phone]  Fax: [phone]  Website:  [email or web address]  Chrystie NoseKenneth C Nikalas Bramel, MD  03/18/2019 8:16 AM

## 2019-03-19 MED FILL — OXYCODONE-APAP 10-325: 10-325 | 30 days supply | Qty: 90 | Fill #0

## 2019-03-23 DIAGNOSIS — R0902 Hypoxemia: Secondary | ICD-10-CM | POA: Diagnosis not present

## 2019-03-23 DIAGNOSIS — G4733 Obstructive sleep apnea (adult) (pediatric): Secondary | ICD-10-CM | POA: Diagnosis not present

## 2019-03-24 MED FILL — BELBUCA 600 MCG FILM: 600 | 30 days supply | Qty: 60 | Fill #0

## 2019-04-21 MED FILL — OXYCODONE-APAP 10-325: 10-325 | 30 days supply | Qty: 90 | Fill #0

## 2019-04-23 DIAGNOSIS — R0902 Hypoxemia: Secondary | ICD-10-CM | POA: Diagnosis not present

## 2019-04-23 DIAGNOSIS — G4733 Obstructive sleep apnea (adult) (pediatric): Secondary | ICD-10-CM | POA: Diagnosis not present

## 2019-04-23 MED FILL — BELBUCA 600 MCG FILM: 600 | 30 days supply | Qty: 60 | Fill #0

## 2019-05-10 DIAGNOSIS — M5136 Other intervertebral disc degeneration, lumbar region: Secondary | ICD-10-CM | POA: Diagnosis not present

## 2019-05-10 DIAGNOSIS — Z760 Encounter for issue of repeat prescription: Secondary | ICD-10-CM | POA: Diagnosis not present

## 2019-05-10 DIAGNOSIS — G894 Chronic pain syndrome: Secondary | ICD-10-CM | POA: Diagnosis not present

## 2019-05-10 DIAGNOSIS — Z79899 Other long term (current) drug therapy: Secondary | ICD-10-CM | POA: Diagnosis not present

## 2019-05-10 MED FILL — BACLOFEN 10 MG TABS: 10 | 30 days supply | Qty: 90 | Fill #0

## 2019-05-21 MED FILL — OXYCODONE-APAP 10-325: 10-325 | 30 days supply | Qty: 90 | Fill #0

## 2019-05-21 MED FILL — BELBUCA 600 MCG FILM: 600 | 30 days supply | Qty: 60 | Fill #0

## 2019-05-24 DIAGNOSIS — G4733 Obstructive sleep apnea (adult) (pediatric): Secondary | ICD-10-CM | POA: Diagnosis not present

## 2019-05-24 DIAGNOSIS — R0902 Hypoxemia: Secondary | ICD-10-CM | POA: Diagnosis not present

## 2019-06-09 DIAGNOSIS — G4733 Obstructive sleep apnea (adult) (pediatric): Secondary | ICD-10-CM | POA: Diagnosis not present

## 2019-06-18 MED FILL — XARELTO 20 MG TABLET: 20 | 90 days supply | Qty: 90 | Fill #1

## 2019-06-18 MED FILL — BACLOFEN 10 MG TABS: 10 | 30 days supply | Qty: 90 | Fill #1

## 2019-06-18 MED FILL — OXYCODONE-APAP 10-325: 10-325 | 30 days supply | Qty: 90 | Fill #0

## 2019-06-18 MED FILL — BELBUCA 600 MCG FILM: 600 | 30 days supply | Qty: 60 | Fill #0

## 2019-06-18 MED FILL — METOPROLOL TARTRATE 50 MG T: 50 | 90 days supply | Qty: 180 | Fill #1

## 2019-06-23 DIAGNOSIS — R0902 Hypoxemia: Secondary | ICD-10-CM | POA: Diagnosis not present

## 2019-06-23 DIAGNOSIS — G4733 Obstructive sleep apnea (adult) (pediatric): Secondary | ICD-10-CM | POA: Diagnosis not present

## 2019-06-23 MED FILL — BELBUCA 600 MCG FILM: 600 | 30 days supply | Qty: 60 | Fill #0

## 2019-07-07 DIAGNOSIS — Z79899 Other long term (current) drug therapy: Secondary | ICD-10-CM | POA: Diagnosis not present

## 2019-07-07 DIAGNOSIS — G894 Chronic pain syndrome: Secondary | ICD-10-CM | POA: Diagnosis not present

## 2019-07-07 DIAGNOSIS — M5136 Other intervertebral disc degeneration, lumbar region: Secondary | ICD-10-CM | POA: Diagnosis not present

## 2019-07-19 MED FILL — OXYCODONE-APAP 10-325: 10-325 | 30 days supply | Qty: 90 | Fill #0

## 2019-07-23 MED FILL — BELBUCA 600 MCG FILM: 600 | 30 days supply | Qty: 60 | Fill #0

## 2019-07-24 DIAGNOSIS — R0902 Hypoxemia: Secondary | ICD-10-CM | POA: Diagnosis not present

## 2019-07-24 DIAGNOSIS — G4733 Obstructive sleep apnea (adult) (pediatric): Secondary | ICD-10-CM | POA: Diagnosis not present

## 2019-08-02 DIAGNOSIS — I10 Essential (primary) hypertension: Secondary | ICD-10-CM | POA: Diagnosis not present

## 2019-08-02 DIAGNOSIS — E119 Type 2 diabetes mellitus without complications: Secondary | ICD-10-CM | POA: Diagnosis not present

## 2019-08-02 DIAGNOSIS — G4730 Sleep apnea, unspecified: Secondary | ICD-10-CM | POA: Diagnosis not present

## 2019-08-02 DIAGNOSIS — Z0001 Encounter for general adult medical examination with abnormal findings: Secondary | ICD-10-CM | POA: Diagnosis not present

## 2019-08-02 MED FILL — LOSARTAN POTASSIUM 50 MG TA: 50 | 90 days supply | Qty: 90 | Fill #0

## 2019-08-18 MED FILL — OXYCODONE-APAP 10-325: 10-325 | 30 days supply | Qty: 90 | Fill #0

## 2019-08-23 DIAGNOSIS — G4733 Obstructive sleep apnea (adult) (pediatric): Secondary | ICD-10-CM | POA: Diagnosis not present

## 2019-08-23 DIAGNOSIS — R0902 Hypoxemia: Secondary | ICD-10-CM | POA: Diagnosis not present

## 2019-08-23 MED FILL — BELBUCA 600 MCG FILM: 600 | 30 days supply | Qty: 60 | Fill #0

## 2019-09-06 DIAGNOSIS — M5136 Other intervertebral disc degeneration, lumbar region: Secondary | ICD-10-CM | POA: Diagnosis not present

## 2019-09-06 DIAGNOSIS — Z79899 Other long term (current) drug therapy: Secondary | ICD-10-CM | POA: Diagnosis not present

## 2019-09-06 DIAGNOSIS — G894 Chronic pain syndrome: Secondary | ICD-10-CM | POA: Diagnosis not present

## 2019-09-06 MED FILL — BACLOFEN 10 MG TABS: 10 | 30 days supply | Qty: 90 | Fill #0

## 2019-09-20 DIAGNOSIS — G4733 Obstructive sleep apnea (adult) (pediatric): Secondary | ICD-10-CM | POA: Diagnosis not present

## 2019-09-20 MED FILL — METOPROLOL TARTRATE 50 MG T: 50 | 90 days supply | Qty: 180 | Fill #2

## 2019-09-20 MED FILL — XARELTO 20 MG TABLET: 20 | 90 days supply | Qty: 90 | Fill #2

## 2019-09-20 MED FILL — BELBUCA 600 MCG FILM: 600 | 30 days supply | Qty: 60 | Fill #0

## 2019-09-20 MED FILL — OXYCODONE-APAP 10-325: 10-325 | 30 days supply | Qty: 90 | Fill #0

## 2019-09-23 DIAGNOSIS — G4733 Obstructive sleep apnea (adult) (pediatric): Secondary | ICD-10-CM | POA: Diagnosis not present

## 2019-09-23 DIAGNOSIS — R0902 Hypoxemia: Secondary | ICD-10-CM | POA: Diagnosis not present

## 2019-10-22 MED FILL — OXYCODONE-APAP 10-325: 10-325 | 30 days supply | Qty: 90 | Fill #0

## 2019-10-22 MED FILL — BACLOFEN 10 MG TABS: 10 | 30 days supply | Qty: 90 | Fill #0

## 2019-10-22 MED FILL — BELBUCA 600 MCG FILM: 600 | 30 days supply | Qty: 60 | Fill #0

## 2019-10-24 DIAGNOSIS — G4733 Obstructive sleep apnea (adult) (pediatric): Secondary | ICD-10-CM | POA: Diagnosis not present

## 2019-10-24 DIAGNOSIS — R0902 Hypoxemia: Secondary | ICD-10-CM | POA: Diagnosis not present

## 2019-11-17 DIAGNOSIS — H5203 Hypermetropia, bilateral: Secondary | ICD-10-CM | POA: Diagnosis not present

## 2019-11-17 DIAGNOSIS — H2513 Age-related nuclear cataract, bilateral: Secondary | ICD-10-CM | POA: Diagnosis not present

## 2019-11-17 DIAGNOSIS — H40023 Open angle with borderline findings, high risk, bilateral: Secondary | ICD-10-CM | POA: Diagnosis not present

## 2019-11-18 DIAGNOSIS — M5136 Other intervertebral disc degeneration, lumbar region: Secondary | ICD-10-CM | POA: Diagnosis not present

## 2019-11-18 DIAGNOSIS — G894 Chronic pain syndrome: Secondary | ICD-10-CM | POA: Diagnosis not present

## 2019-11-18 DIAGNOSIS — Z79899 Other long term (current) drug therapy: Secondary | ICD-10-CM | POA: Diagnosis not present

## 2019-11-18 MED FILL — BACLOFEN 10 MG TABS: 10 | 30 days supply | Qty: 90 | Fill #0

## 2019-11-21 DIAGNOSIS — G4733 Obstructive sleep apnea (adult) (pediatric): Secondary | ICD-10-CM | POA: Diagnosis not present

## 2019-11-21 DIAGNOSIS — R0902 Hypoxemia: Secondary | ICD-10-CM | POA: Diagnosis not present

## 2019-11-22 MED FILL — OXYCODONE-APAP 10-325: 10-325 | 30 days supply | Qty: 90 | Fill #0

## 2019-11-22 MED FILL — BELBUCA 600 MCG FILM: 600 | 30 days supply | Qty: 60 | Fill #0

## 2019-12-13 DIAGNOSIS — G4733 Obstructive sleep apnea (adult) (pediatric): Secondary | ICD-10-CM | POA: Diagnosis not present

## 2019-12-22 DIAGNOSIS — G4733 Obstructive sleep apnea (adult) (pediatric): Secondary | ICD-10-CM | POA: Diagnosis not present

## 2019-12-22 DIAGNOSIS — R0902 Hypoxemia: Secondary | ICD-10-CM | POA: Diagnosis not present

## 2019-12-23 MED FILL — BELBUCA 600 MCG FILM: 600 | 30 days supply | Qty: 60 | Fill #0

## 2019-12-23 MED FILL — OXYCODONE-APAP 10-325: 10-325 | 30 days supply | Qty: 90 | Fill #0

## 2019-12-27 MED FILL — METOPROLOL TARTRATE 50 MG T: 50 | 90 days supply | Qty: 180 | Fill #3

## 2019-12-27 MED FILL — XARELTO 20 MG TABLET: 20 | 90 days supply | Qty: 90 | Fill #3

## 2020-01-18 DIAGNOSIS — Z79899 Other long term (current) drug therapy: Secondary | ICD-10-CM | POA: Diagnosis not present

## 2020-01-18 DIAGNOSIS — G8929 Other chronic pain: Secondary | ICD-10-CM | POA: Diagnosis not present

## 2020-01-18 DIAGNOSIS — M545 Low back pain: Secondary | ICD-10-CM | POA: Diagnosis not present

## 2020-01-18 DIAGNOSIS — E559 Vitamin D deficiency, unspecified: Secondary | ICD-10-CM | POA: Diagnosis not present

## 2020-01-18 DIAGNOSIS — G894 Chronic pain syndrome: Secondary | ICD-10-CM | POA: Diagnosis not present

## 2020-01-18 MED FILL — BACLOFEN 10 MG TABS: 10 | 30 days supply | Qty: 90 | Fill #0

## 2020-01-21 DIAGNOSIS — R0902 Hypoxemia: Secondary | ICD-10-CM | POA: Diagnosis not present

## 2020-01-21 DIAGNOSIS — G4733 Obstructive sleep apnea (adult) (pediatric): Secondary | ICD-10-CM | POA: Diagnosis not present

## 2020-01-21 MED FILL — OXYCODONE-APAP 10-325: 10-325 | 30 days supply | Qty: 90 | Fill #0

## 2020-01-21 MED FILL — BELBUCA 600 MCG FILM: 600 | 30 days supply | Qty: 60 | Fill #0

## 2020-02-21 DIAGNOSIS — R0902 Hypoxemia: Secondary | ICD-10-CM | POA: Diagnosis not present

## 2020-02-21 DIAGNOSIS — G4733 Obstructive sleep apnea (adult) (pediatric): Secondary | ICD-10-CM | POA: Diagnosis not present

## 2020-02-25 ENCOUNTER — Other Ambulatory Visit (HOSPITAL_BASED_OUTPATIENT_CLINIC_OR_DEPARTMENT_OTHER): Payer: Self-pay | Admitting: Nurse Practitioner

## 2020-03-13 DIAGNOSIS — G4733 Obstructive sleep apnea (adult) (pediatric): Secondary | ICD-10-CM | POA: Diagnosis not present

## 2020-03-22 DIAGNOSIS — R0902 Hypoxemia: Secondary | ICD-10-CM | POA: Diagnosis not present

## 2020-03-22 DIAGNOSIS — G4733 Obstructive sleep apnea (adult) (pediatric): Secondary | ICD-10-CM | POA: Diagnosis not present

## 2020-04-03 DIAGNOSIS — G4733 Obstructive sleep apnea (adult) (pediatric): Secondary | ICD-10-CM | POA: Diagnosis not present

## 2020-04-22 DIAGNOSIS — G4733 Obstructive sleep apnea (adult) (pediatric): Secondary | ICD-10-CM | POA: Diagnosis not present

## 2020-04-22 DIAGNOSIS — R0902 Hypoxemia: Secondary | ICD-10-CM | POA: Diagnosis not present

## 2020-05-01 DIAGNOSIS — G4733 Obstructive sleep apnea (adult) (pediatric): Secondary | ICD-10-CM | POA: Diagnosis not present

## 2020-05-02 DIAGNOSIS — R092 Respiratory arrest: Secondary | ICD-10-CM | POA: Diagnosis not present

## 2020-05-02 DIAGNOSIS — G4733 Obstructive sleep apnea (adult) (pediatric): Secondary | ICD-10-CM | POA: Diagnosis not present

## 2020-05-02 DIAGNOSIS — R0902 Hypoxemia: Secondary | ICD-10-CM | POA: Diagnosis not present

## 2020-05-12 ENCOUNTER — Ambulatory Visit: Payer: 59 | Admitting: Internal Medicine

## 2020-05-16 ENCOUNTER — Ambulatory Visit: Payer: 59 | Attending: Internal Medicine

## 2020-05-16 ENCOUNTER — Other Ambulatory Visit: Payer: Self-pay | Admitting: Internal Medicine

## 2020-05-16 ENCOUNTER — Other Ambulatory Visit: Payer: Self-pay

## 2020-05-16 ENCOUNTER — Ambulatory Visit: Payer: 59 | Admitting: Internal Medicine

## 2020-05-16 ENCOUNTER — Encounter: Payer: Self-pay | Admitting: Internal Medicine

## 2020-05-16 VITALS — BP 150/86 | HR 63 | Ht 72.0 in | Wt >= 6400 oz

## 2020-05-16 DIAGNOSIS — I4892 Unspecified atrial flutter: Secondary | ICD-10-CM | POA: Diagnosis not present

## 2020-05-16 DIAGNOSIS — G4733 Obstructive sleep apnea (adult) (pediatric): Secondary | ICD-10-CM | POA: Diagnosis not present

## 2020-05-16 DIAGNOSIS — I10 Essential (primary) hypertension: Secondary | ICD-10-CM

## 2020-05-16 DIAGNOSIS — Z23 Encounter for immunization: Secondary | ICD-10-CM

## 2020-05-16 MED FILL — METOPROLOL TARTRATE 50 MG T: 50 | 90 days supply | Qty: 180 | Fill #0

## 2020-05-16 MED FILL — BACLOFEN 10 MG TABS: 10 | 30 days supply | Qty: 90 | Fill #2

## 2020-05-16 MED FILL — XARELTO 20 MG TABLET: 20 | 90 days supply | Qty: 90 | Fill #0

## 2020-05-16 NOTE — Progress Notes (Signed)
OFFICE FOLLOW-UP NOTE  Chief Complaint:  Routine follow-up  Primary Care Physician: Dois Davenport, MD  HPI:  Charles Barnett is a 54 y.o. male with a past medial history significant for morbid obesity, obstructive sleep apnea on CPAP, arthritis, hypertension and neuromuscular disorder and chronic low back pain on narcotics and status post spinal stimulator. He reports a one-week history of chest pain and cough, but in retrospect he's had fatigue and shortness of breath for several weeks. He presented to [facility] urgent care today for evaluation and he was found to have atrial flutter with rapid ventricular response. EMS was called to urgent care where he was evaluated and given 5 mg of IV Cardizem. Initial lab work in the ER was unremarkable. Initial troponin was negative. Chest x-ray shows mild cardiomegaly and mild interstitial prominence, probably suggestive of heart failure. He was placed on by mouth Cardizem and Xarelto and discharged with plan for outpatient cardioversion. He saw [place], PA-C, as an outpatient and was switched from Cardizem to Lopressor (there was a possible Cardizem side effect). LVEF was normal on echo. He underwent successful DC cardioversion and is maintaining sinus rhythm. He says he does report some energy increased. Blood pressure remains elevated however today. He was placed on losartan HCTZ which was discontinued. He was also noted recently to have elevated parathyroid hormone and hypercalcemia. He scheduled to see Dr. Everardo All with endocrinology.  02/17/2018  Charles Barnett returns today for follow-up.  Since I last saw him he is done well with regards to atrial flutter.  He denies any recurrence.  He has no chest pain or worsening shortness of breath.  Unfortunately weight has gone up further up to 448 pounds.  He is now in the super morbid obesity category with a BMI greater than 60.  Despite this blood pressure is well controlled today.  He is seen by  [facility] endocrinology with Dr. 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  He does not seem to have a diagnosis of diabetes at this point.  He reports significant knee and back pain for which she is on narcotics.  This limits his ambulation.  05/16/2020  Charles Barnett returns today for routine follow-up.  He denies any recurrent atrial fibrillation.  He reports strict compliance with the CPAP.  Unfortunately weight is variable and was as low as 380 pounds but now is back up to 448 pounds.  He is not established with any weight management clinic and is not considering gastric bypass surgery or procedures at this point.  He is working with Dr. Everardo All.  Blood pressure was a little elevated today.  EKG shows sinus rhythm.  He is due for some repeat labs next month.  He denies bleeding issues on Xarelto.  PMHx:  Past Medical History:  Diagnosis Date  . Arthritis   . Atrial flutter ([facility]) 12/2016  . Hypertension   . Neuromuscular disorder ([facility])   . Sleep apnea     Past Surgical History:  Procedure Laterality Date  . CARDIOVERSION N/A 01/30/2017   Procedure: CARDIOVERSION;  Surgeon: Wendall Stade, MD;  Location: [facility] ENDOSCOPY;  Service: Cardiovascular;  Laterality: N/A;  . SPINE SURGERY      FAMHx:  Family History  Problem Relation Age of Onset  . Diabetes Mother   . Hyperlipidemia Mother   . Hypertension Mother   . Heart disease Mother   . Diabetes Sister   . Hypertension Sister   .  Heart disease Sister   . Hyperparathyroidism Neg Hx     SOCHx:   reports that he has never smoked. He has never used smokeless tobacco. He reports that he does not drink alcohol and does not use drugs.  ALLERGIES:  Allergies  Allergen Reactions  . Ibuprofen Other (See Comments)    "bloody stools" Other reaction(s): Other Bloody stool  . Iodine Swelling    Contrast dye  . Iodinated Diagnostic Agents Swelling    ROS: Pertinent items noted in  HPI and remainder of comprehensive ROS otherwise negative.  HOME MEDS: Current Outpatient Medications on File Prior to Visit  Medication Sig Dispense Refill  . baclofen (LIORESAL) 10 MG tablet Take 1 tablet by mouth 3 (three) times daily as needed.    . BELBUCA 600 MCG FILM Take 1 Film by mouth 2 (two) times daily.    . cetirizine (ZYRTEC) 10 MG tablet Take 1 tablet (10 mg total) by mouth daily. 30 tablet 1  . furosemide (LASIX) 20 MG tablet Take 1 tablet daily as needed for shortness of breath. 7 tablet 0  . gabapentin (NEURONTIN) 300 MG capsule Take 300 mg by mouth 3 (three) times daily.    Marland Kitchen losartan (COZAAR) 50 MG tablet Take 1 tablet (50 mg total) by mouth daily. 90 tablet 3  . metoprolol tartrate (LOPRESSOR) 50 MG tablet TAKE ONE TABLET BY MOUTH 2 TIMES A DAY 180 tablet 3  . oxyCODONE-acetaminophen (PERCOCET) 10-325 MG tablet Take 1 tablet by mouth every 6 (six) hours as needed for pain.     . rivaroxaban (XARELTO) 20 MG TABS tablet TAKE 1 TABLET (20 MG TOTAL) BY MOUTH DAILY WITH SUPPER. 90 tablet 3   No current facility-administered medications on file prior to visit.    LABS/IMAGING: No results found for this or any previous visit (from the past 48 hour(s)). No results found.  LIPID PANEL:    Component Value Date/Time   CHOL 176 08/08/2015 1348   TRIG 96 08/08/2015 1348   HDL 43 08/08/2015 1348   CHOLHDL 4.1 08/08/2015 1348   VLDL 19 08/08/2015 1348   LDLCALC 114 08/08/2015 1348     WEIGHTS: Wt Readings from Last 3 Encounters:  05/16/20 (!) 448 lb 12.8 oz (203.6 kg)  03/18/19 (!) 435 lb (197.3 kg)  06/06/18 (!) 432 lb (196 kg)    VITALS: BP (!) 150/86   Pulse 63   Ht 6' (1.829 m)   Wt (!) 448 lb 12.8 oz (203.6 kg)   SpO2 95%   BMI 60.87 kg/m   EXAM: General appearance: alert, no distress and morbidly obese Neck: no carotid bruit and no JVD Lungs: clear to auscultation bilaterally Heart: regular rate and rhythm Abdomen: soft, non-tender; bowel sounds  normal; no masses,  no organomegaly Extremities: extremities normal, atraumatic, no cyanosis or edema Pulses: 2+ and symmetric Skin: Skin color, texture, turgor normal. No rashes or lesions Neurologic: Grossly normal Psych: Pleasant  EKG: Sinus rhythm first-degree AV block at 62, possible left atrial enlargement, incomplete right bundle branch block-personally reviewed  ASSESSMENT: 1. Recent paroxysmal atrial flutter status post DCCV-CHADSVASC score of 1 (2018) 2. Hypertension-uncontrolled 3. Super morbid obesity 4. Obstructive sleep apnea on CPAP  PLAN: 1.   Charles Barnett continues to vacillate with regards to his weight.  He denies any recurrent atrial flutter or fibrillation.  He reports strict compliance with CPAP.  Unfortunately weight is still an issue.  Blood pressure is still not well controlled although is variable he says  is better at home.  He wants to start more exercise and activity but is limited by pain.  He is seeing a pain management specialist through [facility].  Plan follow-up with me annually or sooner as necessary.  Chrystie Nose, MD, [facility], FACP  [place]  [facility]  Medical Director of the Advanced Lipid Disorders &  Cardiovascular Risk Reduction Clinic Diplomate of the American Board of Clinical Lipidology Attending Cardiologist  Direct Dial: [phone]  Fax: [phone]  Website:  [email or web address] Nicely Evalena Fujii 05/16/2020, 11:04 AM

## 2020-05-16 NOTE — Patient Instructions (Signed)
Medication Instructions:  No Changes In Medications at this time.  *If you need a refill on your cardiac medications before your next appointment, please call your pharmacy*   Lab Work: None Ordered At This Time.  If you have labs (blood work) drawn today and your tests are completely normal, you will receive your results only by: . MyChart Message (if you have MyChart) OR . A paper copy in the mail If you have any lab test that is abnormal or we need to change your treatment, we will call you to review the results.   Testing/Procedures: None Ordered At This Time.   Follow-Up: At [name], you and your health needs are our priority.  As part of our continuing mission to provide you with exceptional heart care, we have created designated Provider Care Teams.  These Care Teams include your primary Cardiologist (physician) and Advanced Practice Providers (APPs -  Physician Assistants and Nurse Practitioners) who all work together to provide you with the care you need, when you need it.  Your next appointment:   12 month(s)  The format for your next appointment:   In Person  Provider:   K. Chad Hilty, MD 

## 2020-05-16 NOTE — Progress Notes (Signed)
   Covid-19 Vaccination Clinic  Name:  Charles Barnett    MRN: [id number] DOB: [birth year]  05/16/2020  Charles Barnett was observed post Covid-19 immunization for 15 minutes without incident. He was provided with Vaccine Information Sheet and instruction to access the V-Safe system.   Charles Barnett was instructed to call 911 with any severe reactions post vaccine: Marland Kitchen Difficulty breathing  . Swelling of face and throat  . A fast heartbeat  . A bad rash all over body  . Dizziness and weakness   Immunizations Administered    Name Date Dose VIS Date Route   Moderna COVID-19 Vaccine 05/16/2020  1:04 PM 0.5 mL 08/2019 Intramuscular   Manufacturer: Moderna   Lot: [id number]   NDC: 01314-388-87

## 2020-05-23 DIAGNOSIS — R0902 Hypoxemia: Secondary | ICD-10-CM | POA: Diagnosis not present

## 2020-05-23 DIAGNOSIS — G4733 Obstructive sleep apnea (adult) (pediatric): Secondary | ICD-10-CM | POA: Diagnosis not present

## 2020-06-02 DIAGNOSIS — R092 Respiratory arrest: Secondary | ICD-10-CM | POA: Diagnosis not present

## 2020-06-02 DIAGNOSIS — G4733 Obstructive sleep apnea (adult) (pediatric): Secondary | ICD-10-CM | POA: Diagnosis not present

## 2020-06-02 DIAGNOSIS — R0902 Hypoxemia: Secondary | ICD-10-CM | POA: Diagnosis not present

## 2020-06-05 DIAGNOSIS — G4733 Obstructive sleep apnea (adult) (pediatric): Secondary | ICD-10-CM | POA: Diagnosis not present

## 2020-06-13 ENCOUNTER — Ambulatory Visit: Payer: 59 | Attending: Internal Medicine

## 2020-06-13 DIAGNOSIS — Z23 Encounter for immunization: Secondary | ICD-10-CM

## 2020-06-13 NOTE — Progress Notes (Signed)
   Covid-19 Vaccination Clinic  Name:  Charles Barnett    MRN: [id number] DOB: [birth year]  06/13/2020  Mr. Sievers was observed post Covid-19 immunization for 15 minutes without incident. He was provided with Vaccine Information Sheet and instruction to access the V-Safe system.  Vaccinated by Delight Hoh Fidelis  Mr. Keetch was instructed to call 911 with any severe reactions post vaccine: Marland Kitchen Difficulty breathing  . Swelling of face and throat  . A fast heartbeat  . A bad rash all over body  . Dizziness and weakness   Immunizations Administered    Name Date Dose VIS Date Route   Moderna COVID-19 Vaccine 06/13/2020  1:29 PM 0.5 mL 08/2019 Intramuscular   Manufacturer: Moderna   Lot: [id number]   NDC: 40347-425-95

## 2020-06-22 DIAGNOSIS — R0902 Hypoxemia: Secondary | ICD-10-CM | POA: Diagnosis not present

## 2020-06-22 DIAGNOSIS — G4733 Obstructive sleep apnea (adult) (pediatric): Secondary | ICD-10-CM | POA: Diagnosis not present

## 2020-07-02 DIAGNOSIS — R0902 Hypoxemia: Secondary | ICD-10-CM | POA: Diagnosis not present

## 2020-07-02 DIAGNOSIS — R092 Respiratory arrest: Secondary | ICD-10-CM | POA: Diagnosis not present

## 2020-07-02 DIAGNOSIS — G4733 Obstructive sleep apnea (adult) (pediatric): Secondary | ICD-10-CM | POA: Diagnosis not present

## 2020-07-23 DIAGNOSIS — G4733 Obstructive sleep apnea (adult) (pediatric): Secondary | ICD-10-CM | POA: Diagnosis not present

## 2020-07-23 DIAGNOSIS — R0902 Hypoxemia: Secondary | ICD-10-CM | POA: Diagnosis not present

## 2020-08-02 DIAGNOSIS — R092 Respiratory arrest: Secondary | ICD-10-CM | POA: Diagnosis not present

## 2020-08-02 DIAGNOSIS — G4733 Obstructive sleep apnea (adult) (pediatric): Secondary | ICD-10-CM | POA: Diagnosis not present

## 2020-08-02 DIAGNOSIS — R0902 Hypoxemia: Secondary | ICD-10-CM | POA: Diagnosis not present

## 2020-08-18 MED FILL — METOPROLOL TARTRATE 50 MG T: 50 | 90 days supply | Qty: 180 | Fill #1

## 2020-08-18 MED FILL — XARELTO 20 MG TABLET: 20 | 90 days supply | Qty: 90 | Fill #1

## 2020-08-22 DIAGNOSIS — G4733 Obstructive sleep apnea (adult) (pediatric): Secondary | ICD-10-CM | POA: Diagnosis not present

## 2020-08-22 DIAGNOSIS — R0902 Hypoxemia: Secondary | ICD-10-CM | POA: Diagnosis not present

## 2020-08-29 DIAGNOSIS — G4733 Obstructive sleep apnea (adult) (pediatric): Secondary | ICD-10-CM | POA: Diagnosis not present

## 2020-09-22 DIAGNOSIS — G4733 Obstructive sleep apnea (adult) (pediatric): Secondary | ICD-10-CM | POA: Diagnosis not present

## 2020-09-22 DIAGNOSIS — R0902 Hypoxemia: Secondary | ICD-10-CM | POA: Diagnosis not present

## 2020-10-23 DIAGNOSIS — R0902 Hypoxemia: Secondary | ICD-10-CM | POA: Diagnosis not present

## 2020-10-23 DIAGNOSIS — G4733 Obstructive sleep apnea (adult) (pediatric): Secondary | ICD-10-CM | POA: Diagnosis not present

## 2020-10-30 DIAGNOSIS — E559 Vitamin D deficiency, unspecified: Secondary | ICD-10-CM | POA: Diagnosis not present

## 2020-10-30 DIAGNOSIS — Z0001 Encounter for general adult medical examination with abnormal findings: Secondary | ICD-10-CM | POA: Diagnosis not present

## 2020-10-30 DIAGNOSIS — Z7901 Long term (current) use of anticoagulants: Secondary | ICD-10-CM | POA: Diagnosis not present

## 2020-10-30 DIAGNOSIS — G4730 Sleep apnea, unspecified: Secondary | ICD-10-CM | POA: Diagnosis not present

## 2020-10-30 DIAGNOSIS — I10 Essential (primary) hypertension: Secondary | ICD-10-CM | POA: Diagnosis not present

## 2020-10-30 DIAGNOSIS — G894 Chronic pain syndrome: Secondary | ICD-10-CM | POA: Diagnosis not present

## 2020-10-30 DIAGNOSIS — I4891 Unspecified atrial fibrillation: Secondary | ICD-10-CM | POA: Diagnosis not present

## 2020-10-31 ENCOUNTER — Other Ambulatory Visit (HOSPITAL_BASED_OUTPATIENT_CLINIC_OR_DEPARTMENT_OTHER): Payer: Self-pay | Admitting: Family Medicine

## 2020-10-31 MED FILL — LOSARTAN POTASSIUM 50 MG TA: 50 | 90 days supply | Qty: 90 | Fill #0

## 2020-11-01 ENCOUNTER — Other Ambulatory Visit (HOSPITAL_BASED_OUTPATIENT_CLINIC_OR_DEPARTMENT_OTHER): Payer: Self-pay | Admitting: Family Medicine

## 2020-11-01 MED FILL — VIT D2 1.25 MG (50,000 UNIT: 1.25 MG | 84 days supply | Qty: 12 | Fill #0

## 2020-11-20 DIAGNOSIS — G4733 Obstructive sleep apnea (adult) (pediatric): Secondary | ICD-10-CM | POA: Diagnosis not present

## 2020-11-20 DIAGNOSIS — R0902 Hypoxemia: Secondary | ICD-10-CM | POA: Diagnosis not present

## 2020-11-21 DIAGNOSIS — G4733 Obstructive sleep apnea (adult) (pediatric): Secondary | ICD-10-CM | POA: Diagnosis not present

## 2020-12-21 DIAGNOSIS — G4733 Obstructive sleep apnea (adult) (pediatric): Secondary | ICD-10-CM | POA: Diagnosis not present

## 2020-12-21 DIAGNOSIS — R0902 Hypoxemia: Secondary | ICD-10-CM | POA: Diagnosis not present

## 2020-12-26 ENCOUNTER — Other Ambulatory Visit (HOSPITAL_BASED_OUTPATIENT_CLINIC_OR_DEPARTMENT_OTHER): Payer: Self-pay

## 2020-12-26 DIAGNOSIS — I10 Essential (primary) hypertension: Secondary | ICD-10-CM | POA: Diagnosis not present

## 2020-12-26 DIAGNOSIS — G4730 Sleep apnea, unspecified: Secondary | ICD-10-CM | POA: Diagnosis not present

## 2020-12-26 DIAGNOSIS — R0603 Acute respiratory distress: Secondary | ICD-10-CM | POA: Diagnosis not present

## 2020-12-26 DIAGNOSIS — B349 Viral infection, unspecified: Secondary | ICD-10-CM | POA: Diagnosis not present

## 2020-12-26 DIAGNOSIS — Z20822 Contact with and (suspected) exposure to covid-19: Secondary | ICD-10-CM | POA: Diagnosis not present

## 2020-12-26 DIAGNOSIS — R0902 Hypoxemia: Secondary | ICD-10-CM | POA: Diagnosis not present

## 2020-12-26 DIAGNOSIS — E8770 Fluid overload, unspecified: Secondary | ICD-10-CM | POA: Diagnosis not present

## 2020-12-26 MED ORDER — IPRATROPIUM-ALBUTEROL 0.5-2.5 (3) MG/3ML IN SOLN
RESPIRATORY_TRACT | 0 refills | Status: DC
  Filled 2020-12-26: qty 90, 7d supply, fill #0

## 2020-12-26 MED ORDER — DOXYCYCLINE HYCLATE 100 MG PO CAPS
ORAL_CAPSULE | ORAL | 0 refills | Status: DC
  Filled 2020-12-26: qty 20, 10d supply, fill #0

## 2020-12-26 MED ORDER — PREDNISONE 10 MG PO TABS
ORAL_TABLET | ORAL | 0 refills | Status: DC
  Filled 2020-12-26: qty 7, 7d supply, fill #0

## 2020-12-29 ENCOUNTER — Other Ambulatory Visit (HOSPITAL_BASED_OUTPATIENT_CLINIC_OR_DEPARTMENT_OTHER): Payer: Self-pay

## 2020-12-29 DIAGNOSIS — J449 Chronic obstructive pulmonary disease, unspecified: Secondary | ICD-10-CM | POA: Diagnosis not present

## 2020-12-29 MED ORDER — SPIRONOLACTONE 25 MG PO TABS
ORAL_TABLET | ORAL | 0 refills | Status: DC
  Filled 2020-12-29: qty 30, 30d supply, fill #0

## 2021-01-02 ENCOUNTER — Other Ambulatory Visit (HOSPITAL_BASED_OUTPATIENT_CLINIC_OR_DEPARTMENT_OTHER): Payer: Self-pay

## 2021-01-02 DIAGNOSIS — G4730 Sleep apnea, unspecified: Secondary | ICD-10-CM | POA: Diagnosis not present

## 2021-01-02 DIAGNOSIS — E8770 Fluid overload, unspecified: Secondary | ICD-10-CM | POA: Diagnosis not present

## 2021-01-02 DIAGNOSIS — I10 Essential (primary) hypertension: Secondary | ICD-10-CM | POA: Diagnosis not present

## 2021-01-02 DIAGNOSIS — J441 Chronic obstructive pulmonary disease with (acute) exacerbation: Secondary | ICD-10-CM | POA: Diagnosis not present

## 2021-01-20 DIAGNOSIS — G4733 Obstructive sleep apnea (adult) (pediatric): Secondary | ICD-10-CM | POA: Diagnosis not present

## 2021-01-20 DIAGNOSIS — R0902 Hypoxemia: Secondary | ICD-10-CM | POA: Diagnosis not present

## 2021-01-28 DIAGNOSIS — J449 Chronic obstructive pulmonary disease, unspecified: Secondary | ICD-10-CM | POA: Diagnosis not present

## 2021-02-02 ENCOUNTER — Other Ambulatory Visit (HOSPITAL_BASED_OUTPATIENT_CLINIC_OR_DEPARTMENT_OTHER): Payer: Self-pay

## 2021-02-02 MED ORDER — LOSARTAN POTASSIUM 50 MG PO TABS
ORAL_TABLET | ORAL | 0 refills | Status: DC
  Filled 2021-02-02: qty 90, 90d supply, fill #0

## 2021-02-02 MED ORDER — IPRATROPIUM-ALBUTEROL 0.5-2.5 (3) MG/3ML IN SOLN
RESPIRATORY_TRACT | 11 refills | Status: DC
  Filled 2021-02-02: qty 90, 7d supply, fill #0
  Filled 2021-05-24: qty 90, 7d supply, fill #1
  Filled 2021-08-02: qty 90, 7d supply, fill #2
  Filled 2021-11-20 – 2022-01-17 (×2): qty 90, 7d supply, fill #3

## 2021-02-13 DIAGNOSIS — G4733 Obstructive sleep apnea (adult) (pediatric): Secondary | ICD-10-CM | POA: Diagnosis not present

## 2021-02-20 DIAGNOSIS — R0902 Hypoxemia: Secondary | ICD-10-CM | POA: Diagnosis not present

## 2021-02-20 DIAGNOSIS — G4733 Obstructive sleep apnea (adult) (pediatric): Secondary | ICD-10-CM | POA: Diagnosis not present

## 2021-03-02 ENCOUNTER — Other Ambulatory Visit (HOSPITAL_BASED_OUTPATIENT_CLINIC_OR_DEPARTMENT_OTHER): Payer: Self-pay

## 2021-03-02 MED ORDER — SPIRONOLACTONE 25 MG PO TABS
ORAL_TABLET | ORAL | 0 refills | Status: DC
  Filled 2021-03-02: qty 30, 30d supply, fill #0

## 2021-03-02 MED FILL — Metoprolol Tartrate Tab 50 MG: ORAL | 90 days supply | Qty: 180 | Fill #0 | Status: AC

## 2021-03-02 MED FILL — Rivaroxaban Tab 20 MG: ORAL | 90 days supply | Qty: 90 | Fill #0 | Status: AC

## 2021-03-22 DIAGNOSIS — R0902 Hypoxemia: Secondary | ICD-10-CM | POA: Diagnosis not present

## 2021-03-22 DIAGNOSIS — G4733 Obstructive sleep apnea (adult) (pediatric): Secondary | ICD-10-CM | POA: Diagnosis not present

## 2021-04-03 DIAGNOSIS — G4733 Obstructive sleep apnea (adult) (pediatric): Secondary | ICD-10-CM | POA: Diagnosis not present

## 2021-04-03 DIAGNOSIS — Z6841 Body Mass Index (BMI) 40.0 and over, adult: Secondary | ICD-10-CM | POA: Diagnosis not present

## 2021-04-22 DIAGNOSIS — G4733 Obstructive sleep apnea (adult) (pediatric): Secondary | ICD-10-CM | POA: Diagnosis not present

## 2021-04-22 DIAGNOSIS — R0902 Hypoxemia: Secondary | ICD-10-CM | POA: Diagnosis not present

## 2021-05-03 ENCOUNTER — Other Ambulatory Visit (HOSPITAL_BASED_OUTPATIENT_CLINIC_OR_DEPARTMENT_OTHER): Payer: Self-pay

## 2021-05-04 ENCOUNTER — Other Ambulatory Visit (HOSPITAL_BASED_OUTPATIENT_CLINIC_OR_DEPARTMENT_OTHER): Payer: Self-pay

## 2021-05-07 DIAGNOSIS — I4891 Unspecified atrial fibrillation: Secondary | ICD-10-CM | POA: Diagnosis not present

## 2021-05-07 DIAGNOSIS — I10 Essential (primary) hypertension: Secondary | ICD-10-CM | POA: Diagnosis not present

## 2021-05-07 DIAGNOSIS — E559 Vitamin D deficiency, unspecified: Secondary | ICD-10-CM | POA: Diagnosis not present

## 2021-05-07 DIAGNOSIS — Z0001 Encounter for general adult medical examination with abnormal findings: Secondary | ICD-10-CM | POA: Diagnosis not present

## 2021-05-07 DIAGNOSIS — Z7901 Long term (current) use of anticoagulants: Secondary | ICD-10-CM | POA: Diagnosis not present

## 2021-05-07 DIAGNOSIS — G894 Chronic pain syndrome: Secondary | ICD-10-CM | POA: Diagnosis not present

## 2021-05-08 DIAGNOSIS — G4733 Obstructive sleep apnea (adult) (pediatric): Secondary | ICD-10-CM | POA: Diagnosis not present

## 2021-05-10 ENCOUNTER — Other Ambulatory Visit (HOSPITAL_BASED_OUTPATIENT_CLINIC_OR_DEPARTMENT_OTHER): Payer: Self-pay

## 2021-05-10 MED ORDER — ERGOCALCIFEROL 1.25 MG (50000 UT) PO CAPS
ORAL_CAPSULE | ORAL | 3 refills | Status: DC
  Filled 2021-05-10: qty 24, 84d supply, fill #0
  Filled 2022-01-17: qty 24, 84d supply, fill #1
  Filled 2022-04-18: qty 24, 84d supply, fill #2

## 2021-05-11 ENCOUNTER — Other Ambulatory Visit (HOSPITAL_BASED_OUTPATIENT_CLINIC_OR_DEPARTMENT_OTHER): Payer: Self-pay

## 2021-05-11 MED ORDER — METOPROLOL TARTRATE 50 MG PO TABS: ORAL_TABLET | ORAL | 0 refills | Status: DC

## 2021-05-11 MED ORDER — LOSARTAN POTASSIUM 50 MG PO TABS
ORAL_TABLET | ORAL | 0 refills | Status: DC
  Filled 2021-05-11: qty 90, 90d supply, fill #0

## 2021-05-11 MED ORDER — SPIRONOLACTONE 25 MG PO TABS
ORAL_TABLET | ORAL | 0 refills | Status: DC
  Filled 2021-05-11: qty 90, 90d supply, fill #0

## 2021-05-14 ENCOUNTER — Other Ambulatory Visit (HOSPITAL_BASED_OUTPATIENT_CLINIC_OR_DEPARTMENT_OTHER): Payer: Self-pay

## 2021-05-14 MED ORDER — LOSARTAN POTASSIUM 50 MG PO TABS: ORAL_TABLET | ORAL | 0 refills | Status: DC

## 2021-05-15 ENCOUNTER — Other Ambulatory Visit (HOSPITAL_BASED_OUTPATIENT_CLINIC_OR_DEPARTMENT_OTHER): Payer: Self-pay

## 2021-05-15 MED ORDER — BREZTRI AEROSPHERE 160-9-4.8 MCG/ACT IN AERO
INHALATION_SPRAY | RESPIRATORY_TRACT | 0 refills | Status: DC
  Filled 2021-05-15: qty 10.7, 30d supply, fill #0

## 2021-05-18 ENCOUNTER — Other Ambulatory Visit (HOSPITAL_BASED_OUTPATIENT_CLINIC_OR_DEPARTMENT_OTHER): Payer: Self-pay

## 2021-05-23 DIAGNOSIS — G4733 Obstructive sleep apnea (adult) (pediatric): Secondary | ICD-10-CM | POA: Diagnosis not present

## 2021-05-23 DIAGNOSIS — R0902 Hypoxemia: Secondary | ICD-10-CM | POA: Diagnosis not present

## 2021-05-24 ENCOUNTER — Other Ambulatory Visit (HOSPITAL_BASED_OUTPATIENT_CLINIC_OR_DEPARTMENT_OTHER): Payer: Self-pay

## 2021-06-04 ENCOUNTER — Other Ambulatory Visit: Payer: Self-pay

## 2021-06-04 ENCOUNTER — Ambulatory Visit: Payer: 59 | Admitting: Physician Assistant

## 2021-06-04 ENCOUNTER — Other Ambulatory Visit (HOSPITAL_BASED_OUTPATIENT_CLINIC_OR_DEPARTMENT_OTHER): Payer: Self-pay

## 2021-06-04 VITALS — BP 142/88 | HR 88 | Ht 72.0 in | Wt >= 6400 oz

## 2021-06-04 DIAGNOSIS — I10 Essential (primary) hypertension: Secondary | ICD-10-CM | POA: Diagnosis not present

## 2021-06-04 DIAGNOSIS — I4892 Unspecified atrial flutter: Secondary | ICD-10-CM

## 2021-06-04 DIAGNOSIS — Z01818 Encounter for other preprocedural examination: Secondary | ICD-10-CM

## 2021-06-04 DIAGNOSIS — Z01810 Encounter for preprocedural cardiovascular examination: Secondary | ICD-10-CM

## 2021-06-04 MED ORDER — METOPROLOL TARTRATE 50 MG PO TABS
ORAL_TABLET | ORAL | 3 refills | Status: DC
  Filled 2021-06-04: qty 180, 90d supply, fill #0
  Filled 2021-09-03: qty 180, 90d supply, fill #1
  Filled 2021-12-04: qty 180, 90d supply, fill #2
  Filled 2022-03-01: qty 180, 90d supply, fill #3

## 2021-06-04 MED ORDER — RIVAROXABAN 20 MG PO TABS
ORAL_TABLET | Freq: Every day | ORAL | 3 refills | Status: DC
  Filled 2021-06-04: qty 90, 90d supply, fill #0
  Filled 2021-09-03: qty 90, 90d supply, fill #1
  Filled 2021-12-04: qty 90, 90d supply, fill #2
  Filled 2022-03-01: qty 90, 90d supply, fill #3

## 2021-06-04 MED ORDER — SPIRONOLACTONE 25 MG PO TABS
ORAL_TABLET | ORAL | 3 refills | Status: DC
  Filled 2021-06-04: qty 90, fill #0

## 2021-06-04 NOTE — H&P (View-Only) (Signed)
Cardiology Office Note:    Date:  06/06/2021   ID:  GENE GLAZEBROOK, DOB 1966/04/07, MRN [id number]  PCP:  Dois Davenport, MD   [facility] Providers Cardiologist:  Chrystie Nose, MD     Referring MD: Dois Davenport, MD   Chief Complaint  Patient presents with   Follow-up    Seen for Dr. Rennis Golden     History of Present Illness:    JUVENAL UMAR is a 55 y.o. male with a hx of morbid obesity, obstructive sleep apnea on CPAP, atrial flutter, arthritis, hypertension, neuromuscular disorder and chronic back pain on narcotics and spinal stimulator.  Patient was diagnosed with atrial flutter in 2018 after presenting to urgent care for shortness of breath and fatigue.  He was placed on Cardizem and Xarelto.  Cardizem was later switched to Lopressor.  Echocardiogram obtained on 01/08/2017 showed EF 55 to 60%, mild LVH, mildly dilated right atrium, atrial septal aneurysm noted.  He underwent a successful outpatient DC cardioversion.  He was later diagnosed with parathyroid disorder and followed by [facility] endocrinology with Dr. Revonda Standard.  Patient was seen by Dr. Rennis Golden on 05/16/2020 at which time he has significant weight gain up to 448 pounds from the previous level of 380 pounds.  Blood pressure was also elevated.  He reported strict compliance with CPAP therapy.  His main issue is weight.  Patient presents today for 34-month follow-up.  Unfortunately EKG shows he is back in atrial flutter with controlled heart rate.  He has no cardiac awareness of palpitation, therefore he does not know when he went back into atrial flutter.  He has chronic dyspnea on exertion and the fatigue that is unchanged.  He also has bad back and the bad knee that prevent him from doing too much exercise.  I will discussed with DOD regarding outpatient cardioversion.  He says he has not missed any doses of Xarelto in the past several months.  I plan to see the patient back in 5 weeks for follow-up after the  cardioversion.  Otherwise he denies any chest pain and appears to be euvolemic on exam.  Addendum: discussed with DOD Dr. Jacques Navy, will proceed with outpatient DCCV. Patient has not missed any doses of Xarelto recently. Will also obtain echocardiogram as well. Risk and benefit of the procedure has been discussed with the patient who is agreeable to proceed.   Past Medical History:  Diagnosis Date   Arthritis    Atrial flutter (HCC) 12/2016   Hypertension    Neuromuscular disorder (HCC)    Sleep apnea     Past Surgical History:  Procedure Laterality Date   CARDIOVERSION N/A 01/30/2017   Procedure: CARDIOVERSION;  Surgeon: Wendall Stade, MD;  Location: [facility] ENDOSCOPY;  Service: Cardiovascular;  Laterality: N/A;   SPINE SURGERY      Current Medications: Current Meds  Medication Sig   baclofen (LIORESAL) 10 MG tablet Take 1 tablet by mouth 3 (three) times daily as needed.   BELBUCA 600 MCG FILM Take 1 Film by mouth 2 (two) times daily.   Budeson-Glycopyrrol-Formoterol (BREZTRI AEROSPHERE) 160-9-4.8 MCG/ACT AERO Inhale 2 puffs by mouth 2 times daily   cetirizine (ZYRTEC) 10 MG tablet Take 1 tablet (10 mg total) by mouth daily.   doxycycline (VIBRAMYCIN) 100 MG capsule Take 1 capsule (100 mg) by mouth every 12 hours for 10 days   ergocalciferol (VITAMIN D2) 1.25 MG (50000 UT) capsule Take 1 capsule (1.25 mg) by mouth twice weekly  furosemide (LASIX) 20 MG tablet Take 1 tablet daily as needed for shortness of breath.   gabapentin (NEURONTIN) 300 MG capsule Take 300 mg by mouth 3 (three) times daily.   ipratropium-albuterol (DUONEB) 0.5-2.5 (3) MG/3ML SOLN use 3 ml via nebulizer every 6 hours as needed   losartan (COZAAR) 50 MG tablet Take 1 tablet (50 mg total) by mouth daily.   oxyCODONE-acetaminophen (PERCOCET) 10-325 MG tablet Take 1 tablet by mouth every 6 (six) hours as needed for pain.    predniSONE (DELTASONE) 10 MG tablet TAKE 1 TABLET BY MOUTH DAILY   Vitamin D, Ergocalciferol,  (DRISDOL) 1.25 MG (50000 UNIT) CAPS capsule TAKE 1 CAPSULE (1.25 MG) BY MOUTH WEEKLY   [DISCONTINUED] losartan (COZAAR) 50 MG tablet TAKE 1 TABLET BY MOUTH ONCE DAILY   [DISCONTINUED] losartan (COZAAR) 50 MG tablet take 1 tablet by mouth daily   [DISCONTINUED] losartan (COZAAR) 50 MG tablet Take 1 tablet by mouth daily   [DISCONTINUED] metoprolol tartrate (LOPRESSOR) 50 MG tablet TAKE 1 TABLET BY MOUTH TWICE DAILY WITH FOOD   [DISCONTINUED] metoprolol tartrate (LOPRESSOR) 50 MG tablet Take 1 tablet (50 mg) by mouth 2 times per day with food   [DISCONTINUED] rivaroxaban (XARELTO) 20 MG TABS tablet TAKE 1 TABLET BY MOUTH DAILY WITH FOOD   [DISCONTINUED] spironolactone (ALDACTONE) 25 MG tablet Take 1 tablet by mouth daily     Allergies:   Ibuprofen, Iodine, and Iodinated diagnostic agents   Social History   Socioeconomic History   Marital status: Married    Spouse name: Not on file   Number of children: 3   Years of education: Not on file   Highest education level: Not on file  Occupational History   Not on file  Tobacco Use   Smoking status: Never   Smokeless tobacco: Never  Vaping Use   Vaping Use: Never used  Substance and Sexual Activity   Alcohol use: No    Alcohol/week: 0.0 standard drinks   Drug use: No   Sexual activity: Yes    Partners: Female  Other Topics Concern   Not on file  Social History Narrative   Not on file   Social Determinants of Health   Financial Resource Strain: Not on file  Food Insecurity: Not on file  Transportation Needs: Not on file  Physical Activity: Not on file  Stress: Not on file  Social Connections: Not on file     Family History: The patient's family history includes Diabetes in his mother and sister; Heart disease in his mother and sister; Hyperlipidemia in his mother; Hypertension in his mother and sister. There is no history of Hyperparathyroidism.  ROS:   Please see the history of present illness.     All other systems  reviewed and are negative.  EKGs/Labs/Other Studies Reviewed:    The following studies were reviewed today:  Echo 01/08/2017 LV EF: 55% -   60%  Study Conclusions   - Left ventricle: The cavity size was normal. Wall thickness was    increased in a pattern of mild LVH. Systolic function was normal.    The estimated ejection fraction was in the range of 55% to 60%.    Indeterminant diastolic function (atrial fibrillation). Although    no diagnostic regional wall motion abnormality was identified,    this possibility cannot be completely excluded on the basis of    this study.  - Ventricular septum: Mildly D-shaped interventricular septum,    suggestive of RV pressure/volume overload.  -  Aortic valve: There was no stenosis.  - Mitral valve: There was no significant regurgitation.  - Right ventricle: The cavity size was mildly to moderately    dilated. Systolic function was normal.  - Right atrium: The atrium was mildly dilated.  - Atrial septum: Atrial septal aneurysm noted.  - Pulmonary arteries: No complete TR doppler jet so unable to    estimate PA systolic pressure.  - Systemic veins: IVC was not visualized.   Impressions:   - Technically difficult study with poor acoustic windows. Normal LV    size with mild LV hypertrophy. EF 55-60%. Mildly D-shaped    interventricular septum is suggestive of a degree of RV    pressure/volume overload. Mild to moderate RV dilation with    normal systolic function. Unable to estimate PA systolic pressure    on this study.   EKG:  EKG is ordered today.  The ekg ordered today demonstrates atrial flutter with variable conduction, heart rate 89 bpm.  Right bundle branch block  Recent Labs: 06/04/2021: BUN 11; Creatinine, Ser 1.03; Hemoglobin 15.4; Platelets 230; Potassium 4.8; Sodium 143  Recent Lipid Panel    Component Value Date/Time   CHOL 176 08/08/2015 1348   TRIG 96 08/08/2015 1348   HDL 43 08/08/2015 1348   CHOLHDL 4.1 08/08/2015  1348   VLDL 19 08/08/2015 1348   LDLCALC 114 08/08/2015 1348     Risk Assessment/Calculations:    CHA2DS2-VASc Score = 1   This indicates a 0.6% annual risk of stroke. The patient's score is based upon: CHF History: 0 HTN History: 1 Diabetes History: 0 Stroke History: 0 Vascular Disease History: 0 Age Score: 0 Gender Score: 0          Physical Exam:    VS:  BP (!) 142/88   Pulse 88   Ht 6' (1.829 m)   Wt (!) 459 lb (208.2 kg)   SpO2 97%   BMI 62.25 kg/m     Wt Readings from Last 3 Encounters:  06/04/21 (!) 459 lb (208.2 kg)  05/16/20 (!) 448 lb 12.8 oz (203.6 kg)  03/18/19 (!) 435 lb (197.3 kg)     GEN:  Well nourished, well developed in no acute distress HEENT: Normal NECK: No JVD; No carotid bruits LYMPHATICS: No lymphadenopathy CARDIAC: irregularly irregular, no murmurs, rubs, gallops RESPIRATORY:  Clear to auscultation without rales, wheezing or rhonchi  ABDOMEN: Soft, non-tender, non-distended MUSCULOSKELETAL:  No edema; No deformity  SKIN: Warm and dry NEUROLOGIC:  Alert and oriented x 3 PSYCHIATRIC:  Normal affect   ASSESSMENT:    1. Atrial flutter, unspecified type (HCC)   2. Preop cardiovascular exam   3. Essential hypertension   4. Morbid obesity (HCC)    PLAN:    In order of problems listed above:  Atrial flutter: Patient has no cardiac awareness, EKG today demonstrated recurrent atrial flutter of unknown duration.  Given his young age, I discussed the case with MD, we will proceed with outpatient cardioversion.  He has not missed any doses of Xarelto recently.  He will also need a echocardiogram as well.  Hypertension: Blood pressure stable  Morbid obesity: Weight loss imperative.   Shared Decision Making/Informed Consent The risks (stroke, cardiac arrhythmias rarely resulting in the need for a temporary or permanent pacemaker, skin irritation or burns and complications associated with conscious sedation including aspiration,  arrhythmia, respiratory failure and death), benefits (restoration of normal sinus rhythm) and alternatives of a direct current cardioversion were explained in detail  to Mr. Howdeshell and he agrees to proceed.      Medication Adjustments/Labs and Tests Ordered: Current medicines are reviewed at length with the patient today.  Concerns regarding medicines are outlined above.  Orders Placed This Encounter  Procedures   Basic metabolic panel   CBC   Basic metabolic panel   CBC   EKG 12-Lead   Meds ordered this encounter  Medications   metoprolol tartrate (LOPRESSOR) 50 MG tablet    Sig: Take 1 tablet (50 mg) by mouth 2 times per day with food    Dispense:  180 tablet    Refill:  3   rivaroxaban (XARELTO) 20 MG TABS tablet    Sig: TAKE 1 TABLET BY MOUTH DAILY WITH FOOD    Dispense:  90 tablet    Refill:  3   spironolactone (ALDACTONE) 25 MG tablet    Sig: Take 1 tablet by mouth daily    Dispense:  90 tablet    Refill:  3    Patient Instructions  Medication Instructions:  Your physician recommends that you continue on your current medications as directed. Please refer to the Current Medication list given to you today.  *If you need a refill on your cardiac medications before your next appointment, please call your pharmacy*  Lab Work: Your physician recommends that you return for lab work TODAY:  BMET CBC  Your physician recommends that you return for lab work 1 week prior to cardioversion. Return to the office on Friday 06/22/21, no appointment is needed:  BMET CBC  If you have labs (blood work) drawn today and your tests are completely normal, you will receive your results only by: MyChart Message (if you have MyChart) OR A paper copy in the mail If you have any lab test that is abnormal or we need to change your treatment, we will call you to review the results.  Testing/Procedures: Your physician has recommended that you have a Cardioversion (DCCV). Electrical  Cardioversion uses a jolt of electricity to your heart either through paddles or wired patches attached to your chest. This is a controlled, usually prescheduled, procedure. Defibrillation is done under light anesthesia in the hospital, and you usually go home the day of the procedure. This is done to get your heart back into a normal rhythm. You are not awake for the procedure. Please see the instruction sheet given to you today.  Scheduled for Wednesday 06/27/21 at 12:30 PM with Dr. Lovena Neighbours  Follow-Up: At [facility], you and your health needs are our priority.  As part of our continuing mission to provide you with exceptional heart care, we have created designated Provider Care Teams.  These Care Teams include your primary Cardiologist (physician) and Advanced Practice Providers (APPs -  Physician Assistants and Nurse Practitioners) who all work together to provide you with the care you need, when you need it.  Your next appointment:   5 week(s)  The format for your next appointment:   In Person  Provider:   K. Italy Hilty, MD or APP  Other Instructions     Dear Jeannine Boga,  You are scheduled for a TEE/Cardioversion/TEE Cardioversion on Wednesday 06/27/21  with Dr. Rennis Golden.  Please arrive at the [facility] (Main Entrance A) at [facility]: [address] [place], [place] [address] at 11:30 AM.   DIET: Nothing to eat or drink after midnight except a sip of water with medications (see medication instructions below)  FYI: For your safety, and to  allow [place] to monitor your vital signs accurately during the surgery/procedure we request that   if you have artificial nails, gel coating, SNS etc. Please have those removed prior to your surgery/procedure. Not having the nail coverings /polish removed may result in cancellation or delay of your surgery/procedure.   Medication Instructions: Hold Spironolactone the morning of Cardioversion  Continue your anticoagulant: Xarelto    You will need to continue your anticoagulant after your procedure until you  are told by your  Provider that it is safe to stop   Labs: If patient is on Coumadin, patient needs pt/INR, CBC, BMET within 3 days (No pt/INR needed for patients taking Xarelto, Eliquis, Pradaxa) For patients receiving anesthesia for TEE and all Cardioversion patients: BMET, CBC within 1 week  Come to the lab at [address] [facility] [address] between the hours of 8:00 am and 4:30 pm. You do not have to be fasting.  You must have a responsible person to drive you home and stay in the waiting area during your procedure. Failure to do so could result in cancellation.  Bring your insurance cards.  *Special Note: Every effort is made to have your procedure done on time. Occasionally there are emergencies that occur at the hospital that may cause delays. Please be patient if a delay does occur.    Ramond Dial, [place]  06/06/2021 9:57 PM    [facility]

## 2021-06-04 NOTE — Progress Notes (Signed)
Cardiology Office Note:    Date:  06/06/2021   ID:  Charles Barnett, DOB 1966/04/07, MRN [id number]  PCP:  Dois Davenport, MD   [facility] Providers Cardiologist:  Chrystie Nose, MD     Referring MD: Dois Davenport, MD   Chief Complaint  Patient presents with   Follow-up    Seen for Dr. Rennis Golden     History of Present Illness:    Charles Barnett is a 55 y.o. male with a hx of morbid obesity, obstructive sleep apnea on CPAP, atrial flutter, arthritis, hypertension, neuromuscular disorder and chronic back pain on narcotics and spinal stimulator.  Patient was diagnosed with atrial flutter in 2018 after presenting to urgent care for shortness of breath and fatigue.  He was placed on Cardizem and Xarelto.  Cardizem was later switched to Lopressor.  Echocardiogram obtained on 01/08/2017 showed EF 55 to 60%, mild LVH, mildly dilated right atrium, atrial septal aneurysm noted.  He underwent a successful outpatient DC cardioversion.  He was later diagnosed with parathyroid disorder and followed by [facility] endocrinology with Dr. Revonda Standard.  Patient was seen by Dr. Rennis Golden on 05/16/2020 at which time he has significant weight gain up to 448 pounds from the previous level of 380 pounds.  Blood pressure was also elevated.  He reported strict compliance with CPAP therapy.  His main issue is weight.  Patient presents today for 34-month follow-up.  Unfortunately EKG shows he is back in atrial flutter with controlled heart rate.  He has no cardiac awareness of palpitation, therefore he does not know when he went back into atrial flutter.  He has chronic dyspnea on exertion and the fatigue that is unchanged.  He also has bad back and the bad knee that prevent him from doing too much exercise.  I will discussed with DOD regarding outpatient cardioversion.  He says he has not missed any doses of Xarelto in the past several months.  I plan to see the patient back in 5 weeks for follow-up after the  cardioversion.  Otherwise he denies any chest pain and appears to be euvolemic on exam.  Addendum: discussed with DOD Dr. Jacques Navy, will proceed with outpatient DCCV. Patient has not missed any doses of Xarelto recently. Will also obtain echocardiogram as well. Risk and benefit of the procedure has been discussed with the patient who is agreeable to proceed.   Past Medical History:  Diagnosis Date   Arthritis    Atrial flutter (HCC) 12/2016   Hypertension    Neuromuscular disorder (HCC)    Sleep apnea     Past Surgical History:  Procedure Laterality Date   CARDIOVERSION N/A 01/30/2017   Procedure: CARDIOVERSION;  Surgeon: Wendall Stade, MD;  Location: [facility] ENDOSCOPY;  Service: Cardiovascular;  Laterality: N/A;   SPINE SURGERY      Current Medications: Current Meds  Medication Sig   baclofen (LIORESAL) 10 MG tablet Take 1 tablet by mouth 3 (three) times daily as needed.   BELBUCA 600 MCG FILM Take 1 Film by mouth 2 (two) times daily.   Budeson-Glycopyrrol-Formoterol (BREZTRI AEROSPHERE) 160-9-4.8 MCG/ACT AERO Inhale 2 puffs by mouth 2 times daily   cetirizine (ZYRTEC) 10 MG tablet Take 1 tablet (10 mg total) by mouth daily.   doxycycline (VIBRAMYCIN) 100 MG capsule Take 1 capsule (100 mg) by mouth every 12 hours for 10 days   ergocalciferol (VITAMIN D2) 1.25 MG (50000 UT) capsule Take 1 capsule (1.25 mg) by mouth twice weekly  furosemide (LASIX) 20 MG tablet Take 1 tablet daily as needed for shortness of breath.   gabapentin (NEURONTIN) 300 MG capsule Take 300 mg by mouth 3 (three) times daily.   ipratropium-albuterol (DUONEB) 0.5-2.5 (3) MG/3ML SOLN use 3 ml via nebulizer every 6 hours as needed   losartan (COZAAR) 50 MG tablet Take 1 tablet (50 mg total) by mouth daily.   oxyCODONE-acetaminophen (PERCOCET) 10-325 MG tablet Take 1 tablet by mouth every 6 (six) hours as needed for pain.    predniSONE (DELTASONE) 10 MG tablet TAKE 1 TABLET BY MOUTH DAILY   Vitamin D, Ergocalciferol,  (DRISDOL) 1.25 MG (50000 UNIT) CAPS capsule TAKE 1 CAPSULE (1.25 MG) BY MOUTH WEEKLY   [DISCONTINUED] losartan (COZAAR) 50 MG tablet TAKE 1 TABLET BY MOUTH ONCE DAILY   [DISCONTINUED] losartan (COZAAR) 50 MG tablet take 1 tablet by mouth daily   [DISCONTINUED] losartan (COZAAR) 50 MG tablet Take 1 tablet by mouth daily   [DISCONTINUED] metoprolol tartrate (LOPRESSOR) 50 MG tablet TAKE 1 TABLET BY MOUTH TWICE DAILY WITH FOOD   [DISCONTINUED] metoprolol tartrate (LOPRESSOR) 50 MG tablet Take 1 tablet (50 mg) by mouth 2 times per day with food   [DISCONTINUED] rivaroxaban (XARELTO) 20 MG TABS tablet TAKE 1 TABLET BY MOUTH DAILY WITH FOOD   [DISCONTINUED] spironolactone (ALDACTONE) 25 MG tablet Take 1 tablet by mouth daily     Allergies:   Ibuprofen, Iodine, and Iodinated diagnostic agents   Social History   Socioeconomic History   Marital status: Married    Spouse name: Not on file   Number of children: 3   Years of education: Not on file   Highest education level: Not on file  Occupational History   Not on file  Tobacco Use   Smoking status: Never   Smokeless tobacco: Never  Vaping Use   Vaping Use: Never used  Substance and Sexual Activity   Alcohol use: No    Alcohol/week: 0.0 standard drinks   Drug use: No   Sexual activity: Yes    Partners: Female  Other Topics Concern   Not on file  Social History Narrative   Not on file   Social Determinants of Health   Financial Resource Strain: Not on file  Food Insecurity: Not on file  Transportation Needs: Not on file  Physical Activity: Not on file  Stress: Not on file  Social Connections: Not on file     Family History: The patient's family history includes Diabetes in his mother and sister; Heart disease in his mother and sister; Hyperlipidemia in his mother; Hypertension in his mother and sister. There is no history of Hyperparathyroidism.  ROS:   Please see the history of present illness.     All other systems  reviewed and are negative.  EKGs/Labs/Other Studies Reviewed:    The following studies were reviewed today:  Echo 01/08/2017 LV EF: 55% -   60%  Study Conclusions   - Left ventricle: The cavity size was normal. Wall thickness was    increased in a pattern of mild LVH. Systolic function was normal.    The estimated ejection fraction was in the range of 55% to 60%.    Indeterminant diastolic function (atrial fibrillation). Although    no diagnostic regional wall motion abnormality was identified,    this possibility cannot be completely excluded on the basis of    this study.  - Ventricular septum: Mildly D-shaped interventricular septum,    suggestive of RV pressure/volume overload.  -   Aortic valve: There was no stenosis.  - Mitral valve: There was no significant regurgitation.  - Right ventricle: The cavity size was mildly to moderately    dilated. Systolic function was normal.  - Right atrium: The atrium was mildly dilated.  - Atrial septum: Atrial septal aneurysm noted.  - Pulmonary arteries: No complete TR doppler jet so unable to    estimate PA systolic pressure.  - Systemic veins: IVC was not visualized.   Impressions:   - Technically difficult study with poor acoustic windows. Normal LV    size with mild LV hypertrophy. EF 55-60%. Mildly D-shaped    interventricular septum is suggestive of a degree of RV    pressure/volume overload. Mild to moderate RV dilation with    normal systolic function. Unable to estimate PA systolic pressure    on this study.   EKG:  EKG is ordered today.  The ekg ordered today demonstrates atrial flutter with variable conduction, heart rate 89 bpm.  Right bundle branch block  Recent Labs: 06/04/2021: BUN 11; Creatinine, Ser 1.03; Hemoglobin 15.4; Platelets 230; Potassium 4.8; Sodium 143  Recent Lipid Panel    Component Value Date/Time   CHOL 176 08/08/2015 1348   TRIG 96 08/08/2015 1348   HDL 43 08/08/2015 1348   CHOLHDL 4.1 08/08/2015  1348   VLDL 19 08/08/2015 1348   LDLCALC 114 08/08/2015 1348     Risk Assessment/Calculations:    CHA2DS2-VASc Score = 1   This indicates a 0.6% annual risk of stroke. The patient's score is based upon: CHF History: 0 HTN History: 1 Diabetes History: 0 Stroke History: 0 Vascular Disease History: 0 Age Score: 0 Gender Score: 0          Physical Exam:    VS:  BP (!) 142/88   Pulse 88   Ht 6' (1.829 m)   Wt (!) 459 lb (208.2 kg)   SpO2 97%   BMI 62.25 kg/m     Wt Readings from Last 3 Encounters:  06/04/21 (!) 459 lb (208.2 kg)  05/16/20 (!) 448 lb 12.8 oz (203.6 kg)  03/18/19 (!) 435 lb (197.3 kg)     GEN:  Well nourished, well developed in no acute distress HEENT: Normal NECK: No JVD; No carotid bruits LYMPHATICS: No lymphadenopathy CARDIAC: irregularly irregular, no murmurs, rubs, gallops RESPIRATORY:  Clear to auscultation without rales, wheezing or rhonchi  ABDOMEN: Soft, non-tender, non-distended MUSCULOSKELETAL:  No edema; No deformity  SKIN: Warm and dry NEUROLOGIC:  Alert and oriented x 3 PSYCHIATRIC:  Normal affect   ASSESSMENT:    1. Atrial flutter, unspecified type (HCC)   2. Preop cardiovascular exam   3. Essential hypertension   4. Morbid obesity (HCC)    PLAN:    In order of problems listed above:  Atrial flutter: Patient has no cardiac awareness, EKG today demonstrated recurrent atrial flutter of unknown duration.  Given his young age, I discussed the case with MD, we will proceed with outpatient cardioversion.  He has not missed any doses of Xarelto recently.  He will also need a echocardiogram as well.  Hypertension: Blood pressure stable  Morbid obesity: Weight loss imperative.   Shared Decision Making/Informed Consent The risks (stroke, cardiac arrhythmias rarely resulting in the need for a temporary or permanent pacemaker, skin irritation or burns and complications associated with conscious sedation including aspiration,  arrhythmia, respiratory failure and death), benefits (restoration of normal sinus rhythm) and alternatives of a direct current cardioversion were explained in detail  to Mr. Howdeshell and he agrees to proceed.      Medication Adjustments/Labs and Tests Ordered: Current medicines are reviewed at length with the patient today.  Concerns regarding medicines are outlined above.  Orders Placed This Encounter  Procedures   Basic metabolic panel   CBC   Basic metabolic panel   CBC   EKG 12-Lead   Meds ordered this encounter  Medications   metoprolol tartrate (LOPRESSOR) 50 MG tablet    Sig: Take 1 tablet (50 mg) by mouth 2 times per day with food    Dispense:  180 tablet    Refill:  3   rivaroxaban (XARELTO) 20 MG TABS tablet    Sig: TAKE 1 TABLET BY MOUTH DAILY WITH FOOD    Dispense:  90 tablet    Refill:  3   spironolactone (ALDACTONE) 25 MG tablet    Sig: Take 1 tablet by mouth daily    Dispense:  90 tablet    Refill:  3    Patient Instructions  Medication Instructions:  Your physician recommends that you continue on your current medications as directed. Please refer to the Current Medication list given to you today.  *If you need a refill on your cardiac medications before your next appointment, please call your pharmacy*  Lab Work: Your physician recommends that you return for lab work TODAY:  BMET CBC  Your physician recommends that you return for lab work 1 week prior to cardioversion. Return to the office on Friday 06/22/21, no appointment is needed:  BMET CBC  If you have labs (blood work) drawn today and your tests are completely normal, you will receive your results only by: MyChart Message (if you have MyChart) OR A paper copy in the mail If you have any lab test that is abnormal or we need to change your treatment, we will call you to review the results.  Testing/Procedures: Your physician has recommended that you have a Cardioversion (DCCV). Electrical  Cardioversion uses a jolt of electricity to your heart either through paddles or wired patches attached to your chest. This is a controlled, usually prescheduled, procedure. Defibrillation is done under light anesthesia in the hospital, and you usually go home the day of the procedure. This is done to get your heart back into a normal rhythm. You are not awake for the procedure. Please see the instruction sheet given to you today.  Scheduled for Wednesday 06/27/21 at 12:30 PM with Dr. Lovena Neighbours  Follow-Up: At [facility], you and your health needs are our priority.  As part of our continuing mission to provide you with exceptional heart care, we have created designated Provider Care Teams.  These Care Teams include your primary Cardiologist (physician) and Advanced Practice Providers (APPs -  Physician Assistants and Nurse Practitioners) who all work together to provide you with the care you need, when you need it.  Your next appointment:   5 week(s)  The format for your next appointment:   In Person  Provider:   K. Italy Hilty, MD or APP  Other Instructions     Dear Jeannine Boga,  You are scheduled for a TEE/Cardioversion/TEE Cardioversion on Wednesday 06/27/21  with Dr. Rennis Golden.  Please arrive at the [facility] (Main Entrance A) at [facility]: [address] [place], [place] [address] at 11:30 AM.   DIET: Nothing to eat or drink after midnight except a sip of water with medications (see medication instructions below)  FYI: For your safety, and to  allow [place] to monitor your vital signs accurately during the surgery/procedure we request that   if you have artificial nails, gel coating, SNS etc. Please have those removed prior to your surgery/procedure. Not having the nail coverings /polish removed may result in cancellation or delay of your surgery/procedure.   Medication Instructions: Hold Spironolactone the morning of Cardioversion  Continue your anticoagulant: Xarelto    You will need to continue your anticoagulant after your procedure until you  are told by your  Provider that it is safe to stop   Labs: If patient is on Coumadin, patient needs pt/INR, CBC, BMET within 3 days (No pt/INR needed for patients taking Xarelto, Eliquis, Pradaxa) For patients receiving anesthesia for TEE and all Cardioversion patients: BMET, CBC within 1 week  Come to the lab at [address] [facility] [address] between the hours of 8:00 am and 4:30 pm. You do not have to be fasting.  You must have a responsible person to drive you home and stay in the waiting area during your procedure. Failure to do so could result in cancellation.  Bring your insurance cards.  *Special Note: Every effort is made to have your procedure done on time. Occasionally there are emergencies that occur at the hospital that may cause delays. Please be patient if a delay does occur.    Ramond Dial, [place]  06/06/2021 9:57 PM    [facility]

## 2021-06-04 NOTE — Patient Instructions (Addendum)
Medication Instructions:  Your physician recommends that you continue on your current medications as directed. Please refer to the Current Medication list given to you today.  *If you need a refill on your cardiac medications before your next appointment, please call your pharmacy*  Lab Work: Your physician recommends that you return for lab work TODAY:  BMET CBC  Your physician recommends that you return for lab work 1 week prior to cardioversion. Return to the office on Friday 06/22/21, no appointment is needed:  BMET CBC  If you have labs (blood work) drawn today and your tests are completely normal, you will receive your results only by: MyChart Message (if you have MyChart) OR A paper copy in the mail If you have any lab test that is abnormal or we need to change your treatment, we will call you to review the results.  Testing/Procedures: Your physician has recommended that you have a Cardioversion (DCCV). Electrical Cardioversion uses a jolt of electricity to your heart either through paddles or wired patches attached to your chest. This is a controlled, usually prescheduled, procedure. Defibrillation is done under light anesthesia in the hospital, and you usually go home the day of the procedure. This is done to get your heart back into a normal rhythm. You are not awake for the procedure. Please see the instruction sheet given to you today.  Scheduled for Wednesday 06/27/21 at 12:30 PM with Dr. Lovena Neighbours  Follow-Up: At [facility], you and your health needs are our priority.  As part of our continuing mission to provide you with exceptional heart care, we have created designated Provider Care Teams.  These Care Teams include your primary Cardiologist (physician) and Advanced Practice Providers (APPs -  Physician Assistants and Nurse Practitioners) who all work together to provide you with the care you need, when you need it.  Your next appointment:   5 week(s)  The format for your  next appointment:   In Person  Provider:   K. Italy Hilty, MD or APP  Other Instructions     Dear Charles Barnett,  You are scheduled for a TEE/Cardioversion/TEE Cardioversion on Wednesday 06/27/21  with Dr. Rennis Golden.  Please arrive at the [facility] (Main Entrance A) at [facility]: [address] [place], [place] [address] at 11:30 AM.   DIET: Nothing to eat or drink after midnight except a sip of water with medications (see medication instructions below)  FYI: For your safety, and to allow [place] to monitor your vital signs accurately during the surgery/procedure we request that   if you have artificial nails, gel coating, SNS etc. Please have those removed prior to your surgery/procedure. Not having the nail coverings /polish removed may result in cancellation or delay of your surgery/procedure.   Medication Instructions: Hold Spironolactone the morning of Cardioversion  Continue your anticoagulant: Xarelto   You will need to continue your anticoagulant after your procedure until you  are told by your  Provider that it is safe to stop   Labs: If patient is on Coumadin, patient needs pt/INR, CBC, BMET within 3 days (No pt/INR needed for patients taking Xarelto, Eliquis, Pradaxa) For patients receiving anesthesia for TEE and all Cardioversion patients: BMET, CBC within 1 week  Come to the lab at [address] [facility] [address] between the hours of 8:00 am and 4:30 pm. You do not have to be fasting.  You must have a responsible person to drive you home and stay in the waiting area during your  procedure. Failure to do so could result in cancellation.  Bring your insurance cards.  *Special Note: Every effort is made to have your procedure done on time. Occasionally there are emergencies that occur at the hospital that may cause delays. Please be patient if a delay does occur.

## 2021-06-05 LAB — BASIC METABOLIC PANEL
BUN/Creatinine Ratio: 11 (ref 9–20)
BUN: 11 mg/dL (ref 6–24)
CO2: 26 mmol/L (ref 20–29)
Calcium: 11.4 mg/dL — ABNORMAL HIGH (ref 8.7–10.2)
Chloride: 105 mmol/L (ref 96–106)
Creatinine, Ser: 1.03 mg/dL (ref 0.76–1.27)
Glucose: 101 mg/dL — ABNORMAL HIGH (ref 65–99)
Potassium: 4.8 mmol/L (ref 3.5–5.2)
Sodium: 143 mmol/L (ref 134–144)
eGFR: 85 mL/min/{1.73_m2} (ref >59)

## 2021-06-05 LAB — CBC
Hematocrit: 48.7 % (ref 37.5–51.0)
Hemoglobin: 15.4 g/dL (ref 13.0–17.7)
MCH: 27.8 pg (ref 26.6–33.0)
MCHC: 31.6 g/dL (ref 31.5–35.7)
MCV: 88 fL (ref 79–97)
Platelets: 230 10*3/uL (ref 150–450)
RBC: 5.54 x10E6/uL (ref 4.14–5.80)
RDW: 13.5 % (ref 11.6–15.4)
WBC: 8.8 10*3/uL (ref 3.4–10.8)

## 2021-06-06 ENCOUNTER — Encounter: Payer: Self-pay | Admitting: Physician Assistant

## 2021-06-06 ENCOUNTER — Other Ambulatory Visit: Payer: Self-pay | Admitting: Physician Assistant

## 2021-06-18 ENCOUNTER — Encounter (HOSPITAL_COMMUNITY): Payer: Self-pay | Admitting: Internal Medicine

## 2021-06-22 DIAGNOSIS — R0902 Hypoxemia: Secondary | ICD-10-CM | POA: Diagnosis not present

## 2021-06-22 DIAGNOSIS — G4733 Obstructive sleep apnea (adult) (pediatric): Secondary | ICD-10-CM | POA: Diagnosis not present

## 2021-06-25 ENCOUNTER — Other Ambulatory Visit: Payer: Self-pay

## 2021-06-25 DIAGNOSIS — I4892 Unspecified atrial flutter: Secondary | ICD-10-CM | POA: Diagnosis not present

## 2021-06-25 DIAGNOSIS — Z01810 Encounter for preprocedural cardiovascular examination: Secondary | ICD-10-CM | POA: Diagnosis not present

## 2021-06-26 ENCOUNTER — Other Ambulatory Visit: Payer: Self-pay | Admitting: Physician Assistant

## 2021-06-26 LAB — CBC
Hematocrit: 44.7 % (ref 37.5–51.0)
Hemoglobin: 14.4 g/dL (ref 13.0–17.7)
MCH: 28 pg (ref 26.6–33.0)
MCHC: 32.2 g/dL (ref 31.5–35.7)
MCV: 87 fL (ref 79–97)
Platelets: 300 10*3/uL (ref 150–450)
RBC: 5.14 x10E6/uL (ref 4.14–5.80)
RDW: 13.2 % (ref 11.6–15.4)
WBC: 12.7 10*3/uL — ABNORMAL HIGH (ref 3.4–10.8)

## 2021-06-26 LAB — BASIC METABOLIC PANEL
BUN/Creatinine Ratio: 11 (ref 9–20)
BUN: 11 mg/dL (ref 6–24)
CO2: 24 mmol/L (ref 20–29)
Calcium: 11.1 mg/dL — ABNORMAL HIGH (ref 8.7–10.2)
Chloride: 106 mmol/L (ref 96–106)
Creatinine, Ser: 0.98 mg/dL (ref 0.76–1.27)
Glucose: 79 mg/dL (ref 70–99)
Potassium: 5.1 mmol/L (ref 3.5–5.2)
Sodium: 144 mmol/L (ref 134–144)
eGFR: 91 mL/min/{1.73_m2} (ref >59)

## 2021-06-27 ENCOUNTER — Other Ambulatory Visit: Payer: Self-pay

## 2021-06-27 ENCOUNTER — Ambulatory Visit (HOSPITAL_COMMUNITY)
Admission: RE | Admit: 2021-06-27 | Discharge: 2021-06-27 | Disposition: A | Discharge status: Home / Self Care | Payer: 59 | Attending: Internal Medicine | Admitting: Internal Medicine

## 2021-06-27 ENCOUNTER — Ambulatory Visit (HOSPITAL_COMMUNITY): Payer: 59 | Admitting: Anesthesiology

## 2021-06-27 ENCOUNTER — Encounter (HOSPITAL_COMMUNITY): Payer: Self-pay | Admitting: Internal Medicine

## 2021-06-27 ENCOUNTER — Encounter (HOSPITAL_COMMUNITY)
Admission: RE | Disposition: A | Discharge status: Home / Self Care | Payer: Self-pay | Source: Home / Self Care | Attending: Internal Medicine

## 2021-06-27 DIAGNOSIS — G4733 Obstructive sleep apnea (adult) (pediatric): Secondary | ICD-10-CM | POA: Diagnosis not present

## 2021-06-27 DIAGNOSIS — M549 Dorsalgia, unspecified: Secondary | ICD-10-CM | POA: Insufficient documentation

## 2021-06-27 DIAGNOSIS — Z91041 Radiographic dye allergy status: Secondary | ICD-10-CM | POA: Insufficient documentation

## 2021-06-27 DIAGNOSIS — Z7901 Long term (current) use of anticoagulants: Secondary | ICD-10-CM | POA: Diagnosis not present

## 2021-06-27 DIAGNOSIS — I4892 Unspecified atrial flutter: Secondary | ICD-10-CM | POA: Diagnosis not present

## 2021-06-27 DIAGNOSIS — Z7952 Long term (current) use of systemic steroids: Secondary | ICD-10-CM | POA: Diagnosis not present

## 2021-06-27 DIAGNOSIS — I10 Essential (primary) hypertension: Secondary | ICD-10-CM | POA: Insufficient documentation

## 2021-06-27 DIAGNOSIS — Z888 Allergy status to other drugs, medicaments and biological substances status: Secondary | ICD-10-CM | POA: Diagnosis not present

## 2021-06-27 DIAGNOSIS — I4819 Other persistent atrial fibrillation: Secondary | ICD-10-CM | POA: Diagnosis not present

## 2021-06-27 DIAGNOSIS — G8929 Other chronic pain: Secondary | ICD-10-CM | POA: Insufficient documentation

## 2021-06-27 DIAGNOSIS — Z6841 Body Mass Index (BMI) 40.0 and over, adult: Secondary | ICD-10-CM | POA: Insufficient documentation

## 2021-06-27 DIAGNOSIS — Z886 Allergy status to analgesic agent status: Secondary | ICD-10-CM | POA: Diagnosis not present

## 2021-06-27 DIAGNOSIS — Z79899 Other long term (current) drug therapy: Secondary | ICD-10-CM | POA: Diagnosis not present

## 2021-06-27 DIAGNOSIS — E210 Primary hyperparathyroidism: Secondary | ICD-10-CM | POA: Diagnosis not present

## 2021-06-27 HISTORY — PX: CARDIOVERSION: SHX1299

## 2021-06-27 SURGERY — CARDIOVERSION
Anesthesia: General

## 2021-06-27 MED ORDER — LIDOCAINE 2% (20 MG/ML) 5 ML SYRINGE
INTRAMUSCULAR | Status: DC | PRN
  Administered 2021-06-27: 100 mg via INTRAVENOUS

## 2021-06-27 MED ORDER — PROPOFOL 10 MG/ML IV BOLUS
INTRAVENOUS | Status: DC | PRN
  Administered 2021-06-27 (×2): 50 mg via INTRAVENOUS
  Administered 2021-06-27: 100 mg via INTRAVENOUS

## 2021-06-27 MED ORDER — SODIUM CHLORIDE 0.9 % IV SOLN
INTRAVENOUS | Status: DC
  Administered 2021-06-27: 500 mL via INTRAVENOUS

## 2021-06-27 NOTE — Anesthesia Postprocedure Evaluation (Signed)
Anesthesia Post Note  Patient: Charles Barnett  Procedure(s) Performed: CARDIOVERSION     Patient location during evaluation: PACU Anesthesia Type: General Level of consciousness: awake and alert and oriented Pain management: pain level controlled Vital Signs Assessment: post-procedure vital signs reviewed and stable Respiratory status: spontaneous breathing, nonlabored ventilation and respiratory function stable Cardiovascular status: blood pressure returned to baseline Postop Assessment: no apparent nausea or vomiting Anesthetic complications: no   No notable events documented.  Last Vitals:  Vitals:   06/27/21 1228 06/27/21 1237  BP: (!) 142/90 (!) 146/87  Pulse: 87 90  Resp: 17 16  Temp:    SpO2: 94% 95%    Last Pain:  Vitals:   06/27/21 1237  TempSrc:   PainSc: 0-No pain                 Shanda Howells

## 2021-06-27 NOTE — Discharge Instructions (Signed)

## 2021-06-27 NOTE — Interval H&P Note (Signed)
History and Physical Interval Note:  06/27/2021 11:56 AM  Charles Barnett  has presented today for surgery, with the diagnosis of ATRIAL FLUTTER.  The various methods of treatment have been discussed with the patient and family. After consideration of risks, benefits and other options for treatment, the patient has consented to  Procedure(s): CARDIOVERSION (N/A) as a surgical intervention.  The patient's history has been reviewed, patient examined, no change in status, stable for surgery.  I have reviewed the patient's chart and labs.  Questions were answered to the patient's satisfaction.     Chrystie Nose

## 2021-06-27 NOTE — Anesthesia Preprocedure Evaluation (Addendum)
Anesthesia Evaluation  Patient identified by MRN, date of birth, ID band Patient awake    Reviewed: Allergy & Precautions, NPO status , Patient's Chart, lab work & pertinent test results, reviewed documented beta blocker date and time   History of Anesthesia Complications Negative for: history of anesthetic complications  Airway Mallampati: II  TM Distance: >3 FB Neck ROM: Full    Dental no notable dental hx.    Pulmonary sleep apnea ,    Pulmonary exam normal        Cardiovascular hypertension, Pt. on medications and Pt. on home beta blockers Normal cardiovascular exam+ dysrhythmias (on Xarelto) Atrial Fibrillation   TTE 2018: mild LVH, EF 55-60%, mildly D-shaped interventricular septum suggestive of RV pressure/volume overload, mild to moderate RVE, mild RAE     Neuro/Psych negative neurological ROS  negative psych ROS   GI/Hepatic negative GI ROS, Neg liver ROS,   Endo/Other  Morbid obesity (BMI 62)  Renal/GU negative Renal ROS  negative genitourinary   Musculoskeletal  (+) Arthritis ,   Abdominal   Peds  Hematology negative hematology ROS (+)   Anesthesia Other Findings Day of surgery medications reviewed with patient.  Reproductive/Obstetrics negative OB ROS                           Anesthesia Physical Anesthesia Plan  ASA: 4  Anesthesia Plan: General   Post-op Pain Management:    Induction: Intravenous  PONV Risk Score and Plan: Treatment may vary due to age or medical condition and Propofol infusion  Airway Management Planned: Mask  Additional Equipment: None  Intra-op Plan:   Post-operative Plan:   Informed Consent: I have reviewed the patients History and Physical, chart, labs and discussed the procedure including the risks, benefits and alternatives for the proposed anesthesia with the patient or authorized representative who has indicated his/her understanding and  acceptance.       Plan Discussed with: CRNA  Anesthesia Plan Comments:       Anesthesia Quick Evaluation

## 2021-06-27 NOTE — Transfer of Care (Signed)
Immediate Anesthesia Transfer of Care Note  Patient: Charles Barnett  Procedure(s) Performed: CARDIOVERSION  Patient Location: Endoscopy Unit  Anesthesia Type:General  Level of Consciousness: alert   Airway & Oxygen Therapy: Patient Spontanous Breathing  Post-op Assessment: Report given to RN and Post -op Vital signs reviewed and stable  Post vital signs: Reviewed and stable  Last Vitals:  Vitals Value Taken Time  BP    Temp    Pulse    Resp    SpO2      Last Pain:  Vitals:   06/27/21 1148  TempSrc: Oral  PainSc: 9          Complications: No notable events documented.

## 2021-06-27 NOTE — CV Procedure (Signed)
   CARDIOVERSION NOTE  Procedure: Electrical Cardioversion Indications:  Atrial Flutter  Procedure Details:  Consent: Risks of procedure as well as the alternatives and risks of each were explained to the (patient/caregiver).  Consent for procedure obtained.  Time Out: Verified patient identification, verified procedure, site/side was marked, verified correct patient position, special equipment/implants available, medications/allergies/relevent history reviewed, required imaging and test results available.  Performed  Patient placed on cardiac monitor, pulse oximetry, supplemental oxygen as necessary.  Sedation given:  propofol per anesthesia Pacer pads placed anterior and posterior chest.  Cardioverted 3 time(s).  Cardioverted at 150J, 200J and 200J biphasic (with pressure).  Impression: Findings: Post procedure EKG shows:  initially converted from atrial flutter to atrial fibrillation, then converted to sinus rhythm Complications: None Patient did tolerate procedure well.  Plan: Ultimately successful DCCV after 3 stacked shocks (150J, 200J and 200J biphasic).  Time Spent Directly with the Patient:  45 minutes   Chrystie Nose, MD, [facility], FACP  [place]  [facility]  Medical Director of the [facility]  Cardiovascular Risk Reduction Clinic Diplomate of the American Board of Clinical Lipidology Attending Cardiologist  Direct Dial: [phone]  Fax: [phone]  Website:  [email or web address] [email or web address] Prevette 06/27/2021, 12:14 PM

## 2021-07-01 ENCOUNTER — Encounter (HOSPITAL_COMMUNITY): Payer: Self-pay | Admitting: Internal Medicine

## 2021-07-08 NOTE — Progress Notes (Signed)
Cardiology Clinic Note   Patient Name: Charles Barnett Date of Encounter: 07/10/2021  Primary Care Provider:  Dois Davenport, MD Primary Cardiologist:  Chrystie Nose, MD  Patient Profile    Charles Barnett 55 year old male presents the clinic today for follow-up evaluation of his atrial flutter status post successful DCCV x3 on 06/27/2021.  Past Medical History    Past Medical History:  Diagnosis Date   Arthritis    Atrial flutter (HCC) 12/2016   Hypertension    Neuromuscular disorder (HCC)    Sleep apnea    Past Surgical History:  Procedure Laterality Date   CARDIOVERSION N/A 01/30/2017   Procedure: CARDIOVERSION;  Surgeon: Wendall Stade, MD;  Location: [facility] ENDOSCOPY;  Service: Cardiovascular;  Laterality: N/A;   CARDIOVERSION N/A 06/27/2021   Procedure: CARDIOVERSION;  Surgeon: Chrystie Nose, MD;  Location: [facility] ENDOSCOPY;  Service: Cardiovascular;  Laterality: N/A;   SPINE SURGERY      Allergies  Allergies  Allergen Reactions   Ibuprofen Other (See Comments)    Bloody stool   Iodine Swelling    Contrast dye    History of Present Illness    Charles Barnett is a PMH of morbid obesity, OSA on CPAP, atrial flutter, arthritis, HTN, neuromuscular disorder, and chronic back pain (spinal stimulator, narcotic pain medication).  He was diagnosed with atrial flutter in 2018.  He had presented to urgent care with shortness of breath and fatigue.  He was placed on Xarelto and Cardizem.  His Cardizem was transitioned to Lopressor.  His echocardiogram 4/18 showed an EF of 55-60%, mild LVH, mild dilated right atrium, atrial septal aneurysm.  He underwent successful DCCV.  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  He reported compliance with CPAP.  He was seen by Azalee Course, PA-C on 06/04/2021.  During that time he presented for 53-month follow-up.  His EKG showed atrial flutter with rate control.  He was cardiac unaware.  He was  not sure when he had gone back into atrial flutter.  He reported chronic dyspnea with exertion and fatigue that were unchanged.  His back and knee pain prevented him from being very physically active.  Case was discussed with DOD and DCCV was recommended.  He reported that he had not missed doses of his Xarelto over the previous several months.  Follow-up was planned for 5 weeks after his DCCV.  He denied chest discomfort and was euvolemic on exam.  He underwent successful DCCV on 06/27/2021.  He received 3 shocks before converting to sinus rhythm.  He presents the clinic today for follow-up evaluation states he noted a headache yesterday and chest discomfort with bending over.  He said the pain lasted for a few minutes and dissipated without intervention.  We reviewed his EKG today which shows atrial flutter 92 bpm and options for management.  I contacted his wife over the phone.  They do not wish to proceed with repeat DCCV at this time and would like to be referred to the atrial fibrillation clinic.  He reports compliance with his Xarelto and denies bleeding issues.  He remains physically active at home and is cardiac unaware.  His blood pressure is elevated today at 142/86.  I will stop his losartan and add valsartan.  We will order a BMP in 1 week, give a salty 6 diet sheet, refer to the atrial fibrillation clinic, and have him follow-up in 1 month.  I will give him  a blood pressure log.  Today he denies chest pain, shortness of breath, lower extremity edema, fatigue, palpitations, melena, hematuria, hemoptysis, diaphoresis, weakness, presyncope, syncope, orthopnea, and PND.   Home Medications    Prior to Admission medications   Medication Sig Start Date End Date Taking? Authorizing Provider  baclofen (LIORESAL) 10 MG tablet Take 10 mg by mouth 3 (three) times daily as needed for muscle spasms. 03/09/19   [provider]  Budeson-Glycopyrrol-Formoterol (BREZTRI AEROSPHERE) 160-9-4.8  MCG/ACT AERO Inhale 2 puffs by mouth 2 times daily 05/15/21     ergocalciferol (VITAMIN D2) 1.25 MG (50000 UT) capsule Take 1 capsule (1.25 mg) by mouth twice weekly 05/10/21     ipratropium-albuterol (DUONEB) 0.5-2.5 (3) MG/3ML SOLN use 3 ml via nebulizer every 6 hours as needed 02/02/21     losartan (COZAAR) 50 MG tablet Take 1 tablet (50 mg total) by mouth daily. 03/18/19   Hilty, Lisette Abu, MD  metoprolol tartrate (LOPRESSOR) 50 MG tablet Take 1 tablet (50 mg) by mouth 2 times per day with food 06/04/21   Azalee Course, PA  naproxen sodium (ALEVE) 220 MG tablet Take 440 mg by mouth 2 (two) times daily as needed (pain).    [provider]  rivaroxaban (XARELTO) 20 MG TABS tablet TAKE 1 TABLET BY MOUTH DAILY WITH FOOD 06/04/21 06/04/22  Azalee Course, PA  spironolactone (ALDACTONE) 25 MG tablet Take 1 tablet by mouth daily 06/04/21   Azalee Course, PA    Family History    Family History  Problem Relation Age of Onset   Diabetes Mother    Hyperlipidemia Mother    Hypertension Mother    Heart disease Mother    Diabetes Sister    Hypertension Sister    Heart disease Sister    Hyperparathyroidism Neg Hx    He indicated that his mother is alive. He indicated that his father is deceased. He indicated that his sister is alive. He indicated that his brother is deceased. He indicated that his maternal grandmother is deceased. He indicated that his maternal grandfather is deceased. He indicated that his paternal grandmother is deceased. He indicated that his paternal grandfather is deceased. He indicated that the status of his neg hx is unknown.  Social History    Social History   Socioeconomic History   Marital status: Married    Spouse name: Not on file   Number of children: 3   Years of education: Not on file   Highest education level: Not on file  Occupational History   Not on file  Tobacco Use   Smoking status: Never   Smokeless tobacco: Never  Vaping Use   Vaping Use: Never used   Substance and Sexual Activity   Alcohol use: No    Alcohol/week: 0.0 standard drinks   Drug use: No   Sexual activity: Yes    Partners: Female  Other Topics Concern   Not on file  Social History Narrative   Not on file   Social Determinants of Health   Financial Resource Strain: Not on file  Food Insecurity: Not on file  Transportation Needs: Not on file  Physical Activity: Not on file  Stress: Not on file  Social Connections: Not on file  Intimate Partner Violence: Not on file     Review of Systems    General:  No chills, fever, night sweats or weight changes.  Cardiovascular:  No chest pain, dyspnea on exertion, edema, orthopnea, palpitations, paroxysmal nocturnal dyspnea. Dermatological: No rash, lesions/masses  Respiratory: No cough, dyspnea Urologic: No hematuria, dysuria Abdominal:   No nausea, vomiting, diarrhea, bright red blood per rectum, melena, or hematemesis Neurologic:  No visual changes, wkns, changes in mental status. All other systems reviewed and are otherwise negative except as noted above.  Physical Exam    VS:  BP (!) 142/86   Pulse 92   Ht 6' (1.829 m)   Wt (!) 456 lb (206.8 kg)   SpO2 95%   BMI 61.84 kg/m  , BMI Body mass index is 61.84 kg/m. GEN: Well nourished, well developed, in no acute distress. HEENT: normal. Neck: Supple, no JVD, carotid bruits, or masses. Cardiac: RRR, no murmurs, rubs, or gallops. No clubbing, cyanosis, edema.  Radials/DP/PT 2+ and equal bilaterally.  Respiratory:  Respirations regular and unlabored, clear to auscultation bilaterally. GI: Soft, nontender, nondistended, BS + x 4. MS: no deformity or atrophy. Skin: warm and dry, no rash. Neuro:  Strength and sensation are intact. Psych: Normal affect.  Accessory Clinical Findings    Recent Labs: 06/25/2021: BUN 11; Creatinine, Ser 0.98; Hemoglobin 14.4; Platelets 300; Potassium 5.1; Sodium 144   Recent Lipid Panel    Component Value Date/Time   CHOL 176  08/08/2015 1348   TRIG 96 08/08/2015 1348   HDL 43 08/08/2015 1348   CHOLHDL 4.1 08/08/2015 1348   VLDL 19 08/08/2015 1348   LDLCALC 114 08/08/2015 1348    ECG personally reviewed by me today-atrial flutter with variable AV block with premature aberrantly conducted complexes left axis deviation, incomplete right bundle branch block, inferior infarct undetermined age 27 bpm- No acute changes  Echocardiogram 01/08/2017  Study Conclusions   - Left ventricle: The cavity size was normal. Wall thickness was    increased in a pattern of mild LVH. Systolic function was normal.    The estimated ejection fraction was in the range of 55% to 60%.    Indeterminant diastolic function (atrial fibrillation). Although    no diagnostic regional wall motion abnormality was identified,    this possibility cannot be completely excluded on the basis of    this study.  - Ventricular septum: Mildly D-shaped interventricular septum,    suggestive of RV pressure/volume overload.  - Aortic valve: There was no stenosis.  - Mitral valve: There was no significant regurgitation.  - Right ventricle: The cavity size was mildly to moderately    dilated. Systolic function was normal.  - Right atrium: The atrium was mildly dilated.  - Atrial septum: Atrial septal aneurysm noted.  - Pulmonary arteries: No complete TR doppler jet so unable to    estimate PA systolic pressure.  - Systemic veins: IVC was not visualized.  Assessment & Plan   1.  Atrial flutter-EKG today shows atrial flutter with variable AV block and premature aberrantly conducted complexes left axis deviation incomplete right bundle branch block inferior infarct undetermined age 53 bpm.  Underwent DCCV x3 on 06/27/2021 he was successfully converted to sinus rhythm after his third shock.  CHA2DS2-VASc score 1 for HTN.  Options discussed with both the patient and his wife over the phone.  They do not wish to repeat DCCV and would like to be referred to the  A. fib clinic. Continue Xarelto, metoprolol Heart healthy low-sodium diet-salty 6 given Increase physical activity as tolerated Avoid triggers caffeine, chocolate, EtOH, dehydration etc. Follow-up with A. fib clinic  Obstructive sleep apnea-reports compliance with CPAP. Continue CPAP use Weight loss encouraged  Essential hypertension-BP today 142/86.  Well-controlled at home. Continue metoprolol,  spironolactone Stop losartan Start valsartan 80 mg daily Heart healthy low-sodium diet-salty 6 given Increase physical activity as tolerated BMP in 1 week Morbid obesity-weight today 456. Heart healthy low-sodium diet Increase physical activity as tolerated Continue weight loss  Disposition: Follow-up with Dr. Rennis Golden or APP in 1 months.  Follow-up with A. fib clinic 1-2 months.  Thomasene Ripple. Skyah Hannon NP-C    07/10/2021, 9:28 AM [facility] [address] Office [phone] Fax [phone]  Notice: This dictation was prepared with Dragon dictation along with smaller phrase technology. Any transcriptional errors that result from this process are unintentional and may not be corrected upon review.  I spent 14 minutes examining this patient, reviewing medications, and using patient centered shared decision making involving her cardiac care.  Prior to her visit I spent greater than 20 minutes reviewing her past medical history,  medications, and prior cardiac tests.

## 2021-07-10 ENCOUNTER — Encounter: Payer: Self-pay | Admitting: General Practice

## 2021-07-10 ENCOUNTER — Other Ambulatory Visit (HOSPITAL_BASED_OUTPATIENT_CLINIC_OR_DEPARTMENT_OTHER): Payer: Self-pay

## 2021-07-10 ENCOUNTER — Other Ambulatory Visit: Payer: Self-pay

## 2021-07-10 ENCOUNTER — Ambulatory Visit: Payer: 59 | Admitting: General Practice

## 2021-07-10 VITALS — BP 142/86 | HR 92 | Ht 72.0 in | Wt >= 6400 oz

## 2021-07-10 DIAGNOSIS — I10 Essential (primary) hypertension: Secondary | ICD-10-CM

## 2021-07-10 DIAGNOSIS — G4733 Obstructive sleep apnea (adult) (pediatric): Secondary | ICD-10-CM | POA: Diagnosis not present

## 2021-07-10 DIAGNOSIS — Z79899 Other long term (current) drug therapy: Secondary | ICD-10-CM

## 2021-07-10 DIAGNOSIS — I4892 Unspecified atrial flutter: Secondary | ICD-10-CM

## 2021-07-10 MED ORDER — VALSARTAN 80 MG PO TABS
80.0000 mg | ORAL_TABLET | Freq: Every day | ORAL | 3 refills | Status: DC
  Filled 2021-07-10: qty 30, 30d supply, fill #0

## 2021-07-10 NOTE — Patient Instructions (Signed)
Medication Instructions:  STOP LOSARTAN  START VALSARTAN 80MG  DAILY  *If you need a refill on your cardiac medications before your next appointment, please call your pharmacy*  Lab Work: BMET (NOT FASTING) IN 1 WEEK 07-17-21  If you have labs (blood work) drawn today and your tests are completely normal, you will receive your results only by:  MyChart Message (if you have MyChart) OR A paper copy in the mail.  If you have any lab test that is abnormal or we need to change your treatment, we will call you to review the results. You may go to any [facility] that is convenient for you however, we do have a lab in our office that is able to assist you. You DO NOT need an appointment for our lab. The lab is open 8:00am and closes at 4:00pm. Lunch 12:45 - 1:45pm.  Special Instructions REFERRAL TO AFIB CLINIC  PLEASE READ AND FOLLOW SALTY 6-ATTACHED-1,800 mg daily  Please try to avoid these triggers: Do not use any products that have nicotine or tobacco in them. These include cigarettes, e-cigarettes, and chewing tobacco. If you need help quitting, ask your doctor. Eat heart-healthy foods. Talk with your doctor about the right eating plan for you. Exercise regularly as told by your doctor. Stay hydrated Do not drink alcohol, Caffeine or chocolate. Lose weight if you are overweight. Do not use drugs, including cannabis   TAKE AND LOG YOUR BLOOD PRESSURE AND BRING LOG WITH YOU TO FOLLOW UP APPOINTMENT-TAKE BP AT LEAST AN HOUR AFTER TAKING MEDICATION Follow-Up: Your next appointment:  1 month(s) In Person with JESSE CLEAVER, FNP-C   At [facility], you and your health needs are our priority.  As part of our continuing mission to provide you with exceptional heart care, we have created designated Provider Care Teams.  These Care Teams include your primary Cardiologist (physician) and Advanced Practice Providers (APPs -  Physician Assistants and Nurse Practitioners) who all work together to provide  you with the care you need, when you need it.

## 2021-07-18 ENCOUNTER — Other Ambulatory Visit (HOSPITAL_BASED_OUTPATIENT_CLINIC_OR_DEPARTMENT_OTHER): Payer: Self-pay

## 2021-07-18 ENCOUNTER — Encounter (HOSPITAL_COMMUNITY): Payer: Self-pay | Admitting: Physician Assistant

## 2021-07-18 ENCOUNTER — Other Ambulatory Visit: Payer: Self-pay

## 2021-07-18 ENCOUNTER — Ambulatory Visit (HOSPITAL_COMMUNITY)
Admission: RE | Admit: 2021-07-18 | Discharge: 2021-07-18 | Disposition: A | Discharge status: Home / Self Care | Payer: 59 | Source: Ambulatory Visit | Attending: Physician Assistant | Admitting: Physician Assistant

## 2021-07-18 VITALS — BP 132/80 | HR 92 | Ht 72.0 in | Wt >= 6400 oz

## 2021-07-18 DIAGNOSIS — Z87891 Personal history of nicotine dependence: Secondary | ICD-10-CM | POA: Diagnosis not present

## 2021-07-18 DIAGNOSIS — Z6841 Body Mass Index (BMI) 40.0 and over, adult: Secondary | ICD-10-CM | POA: Diagnosis not present

## 2021-07-18 DIAGNOSIS — E875 Hyperkalemia: Secondary | ICD-10-CM

## 2021-07-18 DIAGNOSIS — Z8249 Family history of ischemic heart disease and other diseases of the circulatory system: Secondary | ICD-10-CM | POA: Insufficient documentation

## 2021-07-18 DIAGNOSIS — I10 Essential (primary) hypertension: Secondary | ICD-10-CM | POA: Diagnosis not present

## 2021-07-18 DIAGNOSIS — Z7901 Long term (current) use of anticoagulants: Secondary | ICD-10-CM | POA: Diagnosis not present

## 2021-07-18 DIAGNOSIS — I4892 Unspecified atrial flutter: Secondary | ICD-10-CM | POA: Insufficient documentation

## 2021-07-18 DIAGNOSIS — G4733 Obstructive sleep apnea (adult) (pediatric): Secondary | ICD-10-CM | POA: Insufficient documentation

## 2021-07-18 DIAGNOSIS — I484 Atypical atrial flutter: Secondary | ICD-10-CM | POA: Diagnosis not present

## 2021-07-18 DIAGNOSIS — Z713 Dietary counseling and surveillance: Secondary | ICD-10-CM | POA: Insufficient documentation

## 2021-07-18 DIAGNOSIS — Z79899 Other long term (current) drug therapy: Secondary | ICD-10-CM

## 2021-07-18 LAB — COMPREHENSIVE METABOLIC PANEL
ALT: 20 U/L (ref 0–44)
AST: 24 U/L (ref 15–41)
Albumin: 3.8 g/dL (ref 3.5–5.0)
Alkaline Phosphatase: 61 U/L (ref 38–126)
Anion gap: 4 — ABNORMAL LOW (ref 5–15)
BUN: 9 mg/dL (ref 6–20)
CO2: 31 mmol/L (ref 22–32)
Calcium: 11.1 mg/dL — ABNORMAL HIGH (ref 8.9–10.3)
Chloride: 105 mmol/L (ref 98–111)
Creatinine, Ser: 0.89 mg/dL (ref 0.61–1.24)
GFR, Estimated: >60 mL/min (ref >60)
Glucose, Bld: 111 mg/dL — ABNORMAL HIGH (ref 70–99)
Potassium: 5.7 mmol/L — ABNORMAL HIGH (ref 3.5–5.1)
Sodium: 140 mmol/L (ref 135–145)
Total Bilirubin: 1 mg/dL (ref 0.3–1.2)
Total Protein: 7.7 g/dL (ref 6.5–8.1)

## 2021-07-18 LAB — CBC
HCT: 52.5 % — ABNORMAL HIGH (ref 39.0–52.0)
Hemoglobin: 16.3 g/dL (ref 13.0–17.0)
MCH: 28.5 pg (ref 26.0–34.0)
MCHC: 31 g/dL (ref 30.0–36.0)
MCV: 91.9 fL (ref 80.0–100.0)
Platelets: 260 10*3/uL (ref 150–400)
RBC: 5.71 MIL/uL (ref 4.22–5.81)
RDW: 14.3 % (ref 11.5–15.5)
WBC: 10.4 10*3/uL (ref 4.0–10.5)
nRBC: 0 % (ref 0.0–0.2)

## 2021-07-18 MED ORDER — MULTAQ 400 MG PO TABS
400.0000 mg | ORAL_TABLET | Freq: Two times a day (BID) | ORAL | 3 refills | Status: DC
Start: 2021-07-18 — End: 2021-11-19
  Filled 2021-07-18: qty 60, 30d supply, fill #0
  Filled 2021-08-21: qty 60, 30d supply, fill #1
  Filled 2021-09-20: qty 60, 30d supply, fill #2
  Filled 2021-10-18: qty 60, 30d supply, fill #3

## 2021-07-18 MED ORDER — AMLODIPINE BESYLATE 5 MG PO TABS
5.0000 mg | ORAL_TABLET | Freq: Every day | ORAL | 3 refills | Status: DC
  Filled 2021-07-18: qty 90, 90d supply, fill #0

## 2021-07-18 NOTE — Progress Notes (Signed)
Attempted to obtain medical history via telephone, unable to reach at this time. I left a voicemail to return pre surgical testing department's phone call.  

## 2021-07-18 NOTE — Patient Instructions (Addendum)
On Monday November 7th start Multaq 400mg  twice a day with food   Cardioversion scheduled for Wednesday, November 9th  - Arrive at the 08-12-1978 and go to admitting at 12PM  - Do not eat or drink anything after midnight the night prior to your procedure.  - Take all your morning medication (except diabetic medications) with a sip of water prior to arrival.  - You will not be able to drive home after your procedure.  - Do NOT miss any doses of your blood thinner - if you should miss a dose please notify our office immediately.  - If you feel as if you go back into normal rhythm prior to scheduled cardioversion, please notify our office immediately. If your procedure is canceled in the cardioversion suite you will be charged a cancellation fee.  Patients will be asked to: to mask in public and hand hygiene (no longer quarantine) in the 3 days prior to surgery, to report if any COVID-19-like illness or household contacts to COVID-19 to determine need for testing

## 2021-07-18 NOTE — H&P (View-Only) (Signed)
Primary Care Physician: Dois Davenport, MD Primary Cardiologist: Dr Rennis Golden  Primary Electrophysiologist: none Referring Physician: Edd Fabian NP   Charles Barnett is a 55 y.o. male with a history of morbid obesity, OSA, atrial flutter, HTN, chronic back pain s/p spinal stimulator who presents for consultation in the [facility].  The patient was initially diagnosed with atrial flutter in 2018 after presenting to urgent care with symptoms of SOB and fatigue. He was started on Xarelto for a CHADS2VASC score of 1 and underwent DCCV on 01/30/17. He was found to be back in atrial flutter on follow up 06/04/21 and had another DCCV on 06/27/21. Unfortunately, he had quick return of his atrial flutter. He is not aware of his arrhythmia currently. He denies any bleeding issues on anticoagulation.   Today, he denies symptoms of palpitations, chest pain, shortness of breath, orthopnea, PND, lower extremity edema, dizziness, presyncope, syncope, bleeding, or neurologic sequela. The patient is tolerating medications without difficulties and is otherwise without complaint today.    Atrial Fibrillation Risk Factors:  he does have symptoms or diagnosis of sleep apnea. he is compliant with CPAP therapy. he does not have a history of rheumatic fever.   he has a BMI of Body mass index is 61.95 kg/m.Marland Kitchen Filed Weights   07/18/21 1058  Weight: (!) 207.2 kg    Family History  Problem Relation Age of Onset   Diabetes Mother    Hyperlipidemia Mother    Hypertension Mother    Heart disease Mother    Diabetes Sister    Hypertension Sister    Heart disease Sister    Hyperparathyroidism Neg Hx      Atrial Fibrillation Management history:  Previous antiarrhythmic drugs: none Previous cardioversions: 01/30/17, 06/27/21 Previous ablations: none CHADS2VASC score: 1 Anticoagulation history: Xarelto    Past Medical History:  Diagnosis Date   Arthritis    Atrial flutter  (HCC) 12/2016   Hypertension    Neuromuscular disorder (HCC)    Sleep apnea    Past Surgical History:  Procedure Laterality Date   CARDIOVERSION N/A 01/30/2017   Procedure: CARDIOVERSION;  Surgeon: Wendall Stade, MD;  Location: [facility] ENDOSCOPY;  Service: Cardiovascular;  Laterality: N/A;   CARDIOVERSION N/A 06/27/2021   Procedure: CARDIOVERSION;  Surgeon: Chrystie Nose, MD;  Location: [facility] ENDOSCOPY;  Service: Cardiovascular;  Laterality: N/A;   SPINE SURGERY      Current Outpatient Medications  Medication Sig Dispense Refill   baclofen (LIORESAL) 10 MG tablet Take 10 mg by mouth 3 (three) times daily as needed for muscle spasms.     Budeson-Glycopyrrol-Formoterol (BREZTRI AEROSPHERE) 160-9-4.8 MCG/ACT AERO Inhale 2 puffs by mouth 2 times daily 10.7 g 0   dronedarone (MULTAQ) 400 MG tablet Take 1 tablet (400 mg total) by mouth 2 (two) times daily with a meal. 60 tablet 3   ergocalciferol (VITAMIN D2) 1.25 MG (50000 UT) capsule Take 1 capsule (1.25 mg) by mouth twice weekly 24 capsule 3   ipratropium-albuterol (DUONEB) 0.5-2.5 (3) MG/3ML SOLN use 3 ml via nebulizer every 6 hours as needed 90 mL 11   metoprolol tartrate (LOPRESSOR) 50 MG tablet Take 1 tablet (50 mg) by mouth 2 times per day with food 180 tablet 3   naproxen sodium (ALEVE) 220 MG tablet Take 440 mg by mouth 2 (two) times daily as needed (pain).     rivaroxaban (XARELTO) 20 MG TABS tablet TAKE 1 TABLET BY MOUTH DAILY WITH FOOD 90 tablet 3  spironolactone (ALDACTONE) 25 MG tablet Take 1 tablet by mouth daily 90 tablet 3   valsartan (DIOVAN) 80 MG tablet Take 1 tablet (80 mg total) by mouth daily. 30 tablet 3   No current facility-administered medications for this encounter.    Allergies  Allergen Reactions   Ibuprofen Other (See Comments)    Bloody stool   Iodine Swelling    Contrast dye    Social History   Socioeconomic History   Marital status: Married    Spouse name: Not on file   Number of children: 3    Years of education: Not on file   Highest education level: Not on file  Occupational History   Not on file  Tobacco Use   Smoking status: Never   Smokeless tobacco: Former    Types: Chew   Tobacco comments:    Former Chew Tobacco 07/18/2021  Vaping Use   Vaping Use: Never used  Substance and Sexual Activity   Alcohol use: No    Alcohol/week: 0.0 standard drinks   Drug use: No   Sexual activity: Yes    Partners: Female  Other Topics Concern   Not on file  Social History Narrative   Not on file   Social Determinants of Health   Financial Resource Strain: Not on file  Food Insecurity: Not on file  Transportation Needs: Not on file  Physical Activity: Not on file  Stress: Not on file  Social Connections: Not on file  Intimate Partner Violence: Not on file     ROS- All systems are reviewed and negative except as per the HPI above.  Physical Exam: Vitals:   07/18/21 1058  BP: 132/80  Pulse: 92  Weight: (!) 207.2 kg  Height: 6' (1.829 m)    GEN- The patient is a well appearing obese male, alert and oriented x 3 today.   Head- normocephalic, atraumatic Eyes-  Sclera clear, conjunctiva pink Ears- hearing intact Oropharynx- clear Neck- supple  Lungs- Clear to ausculation bilaterally, normal work of breathing Heart- irregular rate and rhythm, no murmurs, rubs or gallops  GI- soft, NT, ND, + BS Extremities- no clubbing, cyanosis, or edema MS- no significant deformity or atrophy Skin- no rash or lesion Psych- euthymic mood, full affect Neuro- strength and sensation are intact  Wt Readings from Last 3 Encounters:  07/18/21 (!) 207.2 kg  07/10/21 (!) 206.8 kg  06/27/21 (!) 208.2 kg    EKG today demonstrates  Atypical atrial flutter with variable block Vent. rate 92 BPM PR interval * ms QRS duration 88 ms QT/QTcB 304/375 ms  Echo 01/08/17 demonstrated  - Left ventricle: The cavity size was normal. Wall thickness was    increased in a pattern of mild LVH.  Systolic function was normal.    The estimated ejection fraction was in the range of 55% to 60%.    Indeterminant diastolic function (atrial fibrillation). Although    no diagnostic regional wall motion abnormality was identified,    this possibility cannot be completely excluded on the basis of    this study.  - Ventricular septum: Mildly D-shaped interventricular septum,    suggestive of RV pressure/volume overload.  - Aortic valve: There was no stenosis.  - Mitral valve: There was no significant regurgitation.  - Right ventricle: The cavity size was mildly to moderately    dilated. Systolic function was normal.  - Right atrium: The atrium was mildly dilated.  - Atrial septum: Atrial septal aneurysm noted.  - Pulmonary arteries: No   complete TR doppler jet so unable to    estimate PA systolic pressure.  - Systemic veins: IVC was not visualized.   Impressions:   - Technically difficult study with poor acoustic windows. Normal LV    size with mild LV hypertrophy. EF 55-60%. Mildly D-shaped    interventricular septum is suggestive of a degree of RV    pressure/volume overload. Mild to moderate RV dilation with    normal systolic function. Unable to estimate PA systolic pressure    on this study.   Epic records are reviewed at length today  CHA2DS2-VASc Score = 1  The patient's score is based upon: CHF History: 0 HTN History: 1 Diabetes History: 0 Stroke History: 0 Vascular Disease History: 0 Age Score: 0 Gender Score: 0       ASSESSMENT AND PLAN: 1. Atrial flutter The patient's CHA2DS2-VASc score is 1, indicating a 0.6% annual risk of stroke.   S/p DCCV 06/27/21 with early return of atrial flutter. We discussed therapeutic options today including rhythm vs rate control. Patient would like to pursue SR. Recommended aggressive lifestyle modification with weight loss. Offered referral to [facility], patient declined.  Would avoid class IC with LAFB and inc RBBB on ECG. He is  young for amiodarone.  Discussed Multaq vs dofetilide. Will start Multaq 400 mg BID 2 days before repeat DCCV. Dofetilide would be the next option (QT in SR 448 ms) Check cmet/cbc today.  Continue Xarelto 20 mg daily Continue Lopressor 50 mg BID  2. Morbid Obesity Body mass index is 61.95 kg/m. Lifestyle modification was discussed at length including regular exercise and weight reduction. Suspect this is significantly contributing to his arrhythmias.   3. Obstructive sleep apnea The importance of adequate treatment of sleep apnea was discussed today in order to improve our ability to maintain sinus rhythm long term. Patient reports compliance with CPAP therapy.   4. HTN Stable, no changes today.   Follow up in the AF clinic post DCCV.    Ricky Rebeccah Ivins PA-C Afib Clinic [facility] [address] [place], [place] [address] [phone] 07/18/2021 11:35 AM  

## 2021-07-18 NOTE — Progress Notes (Signed)
Primary Care Physician: Dois Davenport, MD Primary Cardiologist: Dr Rennis Golden  Primary Electrophysiologist: none Referring Physician: Edd Fabian NP   Charles Barnett is a 55 y.o. male with a history of morbid obesity, OSA, atrial flutter, HTN, chronic back pain s/p spinal stimulator who presents for consultation in the [facility].  The patient was initially diagnosed with atrial flutter in 2018 after presenting to urgent care with symptoms of SOB and fatigue. He was started on Xarelto for a CHADS2VASC score of 1 and underwent DCCV on 01/30/17. He was found to be back in atrial flutter on follow up 06/04/21 and had another DCCV on 06/27/21. Unfortunately, he had quick return of his atrial flutter. He is not aware of his arrhythmia currently. He denies any bleeding issues on anticoagulation.   Today, he denies symptoms of palpitations, chest pain, shortness of breath, orthopnea, PND, lower extremity edema, dizziness, presyncope, syncope, bleeding, or neurologic sequela. The patient is tolerating medications without difficulties and is otherwise without complaint today.    Atrial Fibrillation Risk Factors:  he does have symptoms or diagnosis of sleep apnea. he is compliant with CPAP therapy. he does not have a history of rheumatic fever.   he has a BMI of Body mass index is 61.95 kg/m.Marland Kitchen Filed Weights   07/18/21 1058  Weight: (!) 207.2 kg    Family History  Problem Relation Age of Onset   Diabetes Mother    Hyperlipidemia Mother    Hypertension Mother    Heart disease Mother    Diabetes Sister    Hypertension Sister    Heart disease Sister    Hyperparathyroidism Neg Hx      Atrial Fibrillation Management history:  Previous antiarrhythmic drugs: none Previous cardioversions: 01/30/17, 06/27/21 Previous ablations: none CHADS2VASC score: 1 Anticoagulation history: Xarelto    Past Medical History:  Diagnosis Date   Arthritis    Atrial flutter  (HCC) 12/2016   Hypertension    Neuromuscular disorder (HCC)    Sleep apnea    Past Surgical History:  Procedure Laterality Date   CARDIOVERSION N/A 01/30/2017   Procedure: CARDIOVERSION;  Surgeon: Wendall Stade, MD;  Location: [facility] ENDOSCOPY;  Service: Cardiovascular;  Laterality: N/A;   CARDIOVERSION N/A 06/27/2021   Procedure: CARDIOVERSION;  Surgeon: Chrystie Nose, MD;  Location: [facility] ENDOSCOPY;  Service: Cardiovascular;  Laterality: N/A;   SPINE SURGERY      Current Outpatient Medications  Medication Sig Dispense Refill   baclofen (LIORESAL) 10 MG tablet Take 10 mg by mouth 3 (three) times daily as needed for muscle spasms.     Budeson-Glycopyrrol-Formoterol (BREZTRI AEROSPHERE) 160-9-4.8 MCG/ACT AERO Inhale 2 puffs by mouth 2 times daily 10.7 g 0   dronedarone (MULTAQ) 400 MG tablet Take 1 tablet (400 mg total) by mouth 2 (two) times daily with a meal. 60 tablet 3   ergocalciferol (VITAMIN D2) 1.25 MG (50000 UT) capsule Take 1 capsule (1.25 mg) by mouth twice weekly 24 capsule 3   ipratropium-albuterol (DUONEB) 0.5-2.5 (3) MG/3ML SOLN use 3 ml via nebulizer every 6 hours as needed 90 mL 11   metoprolol tartrate (LOPRESSOR) 50 MG tablet Take 1 tablet (50 mg) by mouth 2 times per day with food 180 tablet 3   naproxen sodium (ALEVE) 220 MG tablet Take 440 mg by mouth 2 (two) times daily as needed (pain).     rivaroxaban (XARELTO) 20 MG TABS tablet TAKE 1 TABLET BY MOUTH DAILY WITH FOOD 90 tablet 3  spironolactone (ALDACTONE) 25 MG tablet Take 1 tablet by mouth daily 90 tablet 3   valsartan (DIOVAN) 80 MG tablet Take 1 tablet (80 mg total) by mouth daily. 30 tablet 3   No current facility-administered medications for this encounter.    Allergies  Allergen Reactions   Ibuprofen Other (See Comments)    Bloody stool   Iodine Swelling    Contrast dye    Social History   Socioeconomic History   Marital status: Married    Spouse name: Not on file   Number of children: 3    Years of education: Not on file   Highest education level: Not on file  Occupational History   Not on file  Tobacco Use   Smoking status: Never   Smokeless tobacco: Former    Types: Chew   Tobacco comments:    Former Chew Tobacco 07/18/2021  Vaping Use   Vaping Use: Never used  Substance and Sexual Activity   Alcohol use: No    Alcohol/week: 0.0 standard drinks   Drug use: No   Sexual activity: Yes    Partners: Female  Other Topics Concern   Not on file  Social History Narrative   Not on file   Social Determinants of Health   Financial Resource Strain: Not on file  Food Insecurity: Not on file  Transportation Needs: Not on file  Physical Activity: Not on file  Stress: Not on file  Social Connections: Not on file  Intimate Partner Violence: Not on file     ROS- All systems are reviewed and negative except as per the HPI above.  Physical Exam: Vitals:   07/18/21 1058  BP: 132/80  Pulse: 92  Weight: (!) 207.2 kg  Height: 6' (1.829 m)    GEN- The patient is a well appearing obese male, alert and oriented x 3 today.   Head- normocephalic, atraumatic Eyes-  Sclera clear, conjunctiva pink Ears- hearing intact Oropharynx- clear Neck- supple  Lungs- Clear to ausculation bilaterally, normal work of breathing Heart- irregular rate and rhythm, no murmurs, rubs or gallops  GI- soft, NT, ND, + BS Extremities- no clubbing, cyanosis, or edema MS- no significant deformity or atrophy Skin- no rash or lesion Psych- euthymic mood, full affect Neuro- strength and sensation are intact  Wt Readings from Last 3 Encounters:  07/18/21 (!) 207.2 kg  07/10/21 (!) 206.8 kg  06/27/21 (!) 208.2 kg    EKG today demonstrates  Atypical atrial flutter with variable block Vent. rate 92 BPM PR interval * ms QRS duration 88 ms QT/QTcB 304/375 ms  Echo 01/08/17 demonstrated  - Left ventricle: The cavity size was normal. Wall thickness was    increased in a pattern of mild LVH.  Systolic function was normal.    The estimated ejection fraction was in the range of 55% to 60%.    Indeterminant diastolic function (atrial fibrillation). Although    no diagnostic regional wall motion abnormality was identified,    this possibility cannot be completely excluded on the basis of    this study.  - Ventricular septum: Mildly D-shaped interventricular septum,    suggestive of RV pressure/volume overload.  - Aortic valve: There was no stenosis.  - Mitral valve: There was no significant regurgitation.  - Right ventricle: The cavity size was mildly to moderately    dilated. Systolic function was normal.  - Right atrium: The atrium was mildly dilated.  - Atrial septum: Atrial septal aneurysm noted.  - Pulmonary arteries: No  complete TR doppler jet so unable to    estimate PA systolic pressure.  - Systemic veins: IVC was not visualized.   Impressions:   - Technically difficult study with poor acoustic windows. Normal LV    size with mild LV hypertrophy. EF 55-60%. Mildly D-shaped    interventricular septum is suggestive of a degree of RV    pressure/volume overload. Mild to moderate RV dilation with    normal systolic function. Unable to estimate PA systolic pressure    on this study.   Epic records are reviewed at length today  CHA2DS2-VASc Score = 1  The patient's score is based upon: CHF History: 0 HTN History: 1 Diabetes History: 0 Stroke History: 0 Vascular Disease History: 0 Age Score: 0 Gender Score: 0       ASSESSMENT AND PLAN: 1. Atrial flutter The patient's CHA2DS2-VASc score is 1, indicating a 0.6% annual risk of stroke.   S/p DCCV 06/27/21 with early return of atrial flutter. We discussed therapeutic options today including rhythm vs rate control. Patient would like to pursue SR. Recommended aggressive lifestyle modification with weight loss. Offered referral to [facility], patient declined.  Would avoid class IC with LAFB and inc RBBB on ECG. He is  young for amiodarone.  Discussed Multaq vs dofetilide. Will start Multaq 400 mg BID 2 days before repeat DCCV. Dofetilide would be the next option (QT in SR 448 ms) Check cmet/cbc today.  Continue Xarelto 20 mg daily Continue Lopressor 50 mg BID  2. Morbid Obesity Body mass index is 61.95 kg/m. Lifestyle modification was discussed at length including regular exercise and weight reduction. Suspect this is significantly contributing to his arrhythmias.   3. Obstructive sleep apnea The importance of adequate treatment of sleep apnea was discussed today in order to improve our ability to maintain sinus rhythm long term. Patient reports compliance with CPAP therapy.   4. HTN Stable, no changes today.   Follow up in the AF clinic post DCCV.    Jorja Loa PA-C Afib Clinic [facility] [address] [place], [place] [address] [phone] 07/18/2021 11:35 AM

## 2021-07-18 NOTE — Addendum Note (Signed)
Encounter addended by: Shona Simpson, RN on: 07/18/2021 11:48 AM  Actions taken: Clinical Note Signed

## 2021-07-20 ENCOUNTER — Other Ambulatory Visit (HOSPITAL_BASED_OUTPATIENT_CLINIC_OR_DEPARTMENT_OTHER): Payer: Self-pay

## 2021-07-23 DIAGNOSIS — R0902 Hypoxemia: Secondary | ICD-10-CM | POA: Diagnosis not present

## 2021-07-23 DIAGNOSIS — G4733 Obstructive sleep apnea (adult) (pediatric): Secondary | ICD-10-CM | POA: Diagnosis not present

## 2021-07-25 ENCOUNTER — Encounter (HOSPITAL_COMMUNITY)
Admission: RE | Disposition: A | Discharge status: Home / Self Care | Payer: Self-pay | Source: Home / Self Care | Attending: Cardiovascular Disease

## 2021-07-25 ENCOUNTER — Other Ambulatory Visit: Payer: Self-pay

## 2021-07-25 ENCOUNTER — Encounter (HOSPITAL_COMMUNITY): Payer: Self-pay | Admitting: Cardiovascular Disease

## 2021-07-25 ENCOUNTER — Ambulatory Visit (HOSPITAL_COMMUNITY): Payer: 59 | Admitting: Anesthesiology

## 2021-07-25 ENCOUNTER — Ambulatory Visit (HOSPITAL_COMMUNITY)
Admission: RE | Admit: 2021-07-25 | Discharge: 2021-07-25 | Disposition: A | Discharge status: Home / Self Care | Payer: 59 | Attending: Cardiovascular Disease | Admitting: Cardiovascular Disease

## 2021-07-25 DIAGNOSIS — I4891 Unspecified atrial fibrillation: Secondary | ICD-10-CM | POA: Insufficient documentation

## 2021-07-25 DIAGNOSIS — Z6841 Body Mass Index (BMI) 40.0 and over, adult: Secondary | ICD-10-CM | POA: Insufficient documentation

## 2021-07-25 DIAGNOSIS — G8929 Other chronic pain: Secondary | ICD-10-CM | POA: Diagnosis not present

## 2021-07-25 DIAGNOSIS — I4892 Unspecified atrial flutter: Secondary | ICD-10-CM | POA: Diagnosis not present

## 2021-07-25 DIAGNOSIS — Z79899 Other long term (current) drug therapy: Secondary | ICD-10-CM | POA: Diagnosis not present

## 2021-07-25 DIAGNOSIS — I10 Essential (primary) hypertension: Secondary | ICD-10-CM | POA: Diagnosis not present

## 2021-07-25 DIAGNOSIS — I483 Typical atrial flutter: Secondary | ICD-10-CM | POA: Diagnosis not present

## 2021-07-25 DIAGNOSIS — G4733 Obstructive sleep apnea (adult) (pediatric): Secondary | ICD-10-CM | POA: Diagnosis not present

## 2021-07-25 DIAGNOSIS — M549 Dorsalgia, unspecified: Secondary | ICD-10-CM | POA: Insufficient documentation

## 2021-07-25 DIAGNOSIS — E210 Primary hyperparathyroidism: Secondary | ICD-10-CM | POA: Diagnosis not present

## 2021-07-25 DIAGNOSIS — Z7901 Long term (current) use of anticoagulants: Secondary | ICD-10-CM | POA: Diagnosis not present

## 2021-07-25 DIAGNOSIS — I4819 Other persistent atrial fibrillation: Secondary | ICD-10-CM | POA: Diagnosis not present

## 2021-07-25 HISTORY — PX: CARDIOVERSION: SHX1299

## 2021-07-25 SURGERY — CARDIOVERSION
Anesthesia: General

## 2021-07-25 MED ORDER — LIDOCAINE 2% (20 MG/ML) 5 ML SYRINGE
INTRAMUSCULAR | Status: DC | PRN
  Administered 2021-07-25: 100 mg via INTRAVENOUS

## 2021-07-25 MED ORDER — SODIUM CHLORIDE 0.9 % IV SOLN: INTRAVENOUS | Status: DC

## 2021-07-25 MED ORDER — PROPOFOL 10 MG/ML IV BOLUS
INTRAVENOUS | Status: DC | PRN
  Administered 2021-07-25: 100 mg via INTRAVENOUS
  Administered 2021-07-25 (×2): 20 mg via INTRAVENOUS

## 2021-07-25 NOTE — Anesthesia Postprocedure Evaluation (Signed)
Anesthesia Post Note  Patient: Charles Barnett  Procedure(s) Performed: CARDIOVERSION     Patient location during evaluation: PACU Anesthesia Type: General Level of consciousness: awake and alert Pain management: pain level controlled Vital Signs Assessment: post-procedure vital signs reviewed and stable Respiratory status: spontaneous breathing, nonlabored ventilation, respiratory function stable and patient connected to nasal cannula oxygen Cardiovascular status: blood pressure returned to baseline and stable Postop Assessment: no apparent nausea or vomiting Anesthetic complications: no   No notable events documented.  Last Vitals:  Vitals:   07/25/21 1359 07/25/21 1405  BP: (!) 141/65 (!) 141/74  Pulse: 88 86  Resp: (!) 24 (!) 27  Temp:    SpO2: 93% 95%    Last Pain:  Vitals:   07/25/21 1343  TempSrc: Oral  PainSc:                  Nelle Don Tyquon Near

## 2021-07-25 NOTE — Transfer of Care (Signed)
Immediate Anesthesia Transfer of Care Note  Patient: Charles Barnett  Procedure(s) Performed: CARDIOVERSION  Patient Location: Endoscopy Unit  Anesthesia Type:General  Level of Consciousness: drowsy and patient cooperative  Airway & Oxygen Therapy: Patient Spontanous Breathing and Patient connected to face mask oxygen  Post-op Assessment: Report given to RN and Post -op Vital signs reviewed and stable  Post vital signs: Reviewed and stable  Last Vitals:  Vitals Value Taken Time  BP 122/66   Temp    Pulse 88   Resp 14   SpO2 96%     Last Pain:  Vitals:   07/25/21 1220  TempSrc: Oral  PainSc: 9          Complications: No notable events documented.

## 2021-07-25 NOTE — CV Procedure (Signed)
Electrical Cardioversion Procedure Note TAHER VANNOTE [id number] 03-04-1966  Procedure: Electrical Cardioversion Indications:  Atrial Flutter  Procedure Details Consent: Risks of procedure as well as the alternatives and risks of each were explained to the (patient/caregiver).  Consent for procedure obtained. Time Out: Verified patient identification, verified procedure, site/side was marked, verified correct patient position, special equipment/implants available, medications/allergies/relevent history reviewed, required imaging and test results available.  Performed  Patient placed on cardiac monitor, pulse oximetry, supplemental oxygen as necessary.  Sedation given:  propofol Pacer pads placed anterior and posterior chest.  Cardioverted 1 time(s).  Cardioverted at 200J with pressure, successful.  Evaluation Findings: Post procedure EKG shows: NSR Complications: None Patient did tolerate procedure well.   Chilton Si, MD 07/25/2021, 1:40 PM

## 2021-07-25 NOTE — Interval H&P Note (Signed)
History and Physical Interval Note:  07/25/2021 1:31 PM  Charles Barnett  has presented today for surgery, with the diagnosis of AFIB.  The various methods of treatment have been discussed with the patient and family. After consideration of risks, benefits and other options for treatment, the patient has consented to  Procedure(s): CARDIOVERSION (N/A) as a surgical intervention.  The patient's history has been reviewed, patient examined, no change in status, stable for surgery.  I have reviewed the patient's chart and labs.  Questions were answered to the patient's satisfaction.     Chilton Si, MD

## 2021-07-25 NOTE — Anesthesia Preprocedure Evaluation (Addendum)
Anesthesia Evaluation  Patient identified by MRN, date of birth, ID band Patient awake    Reviewed: Allergy & Precautions, H&P , NPO status , Patient's Chart, lab work & pertinent test results, reviewed documented beta blocker date and time   Airway Mallampati: IV  TM Distance: >3 FB Neck ROM: Full  Mouth opening: Limited Mouth Opening  Dental no notable dental hx. (+) Dental Advisory Given, Partial Upper, Partial Lower   Pulmonary sleep apnea ,    Pulmonary exam normal breath sounds clear to auscultation       Cardiovascular hypertension, Pt. on medications and Pt. on home beta blockers + dysrhythmias Atrial Fibrillation  Rhythm:Irregular Rate:Tachycardia     Neuro/Psych negative neurological ROS  negative psych ROS   GI/Hepatic negative GI ROS, Neg liver ROS,   Endo/Other  Morbid obesity  Renal/GU negative Renal ROS  negative genitourinary   Musculoskeletal  (+) Arthritis , Osteoarthritis,    Abdominal   Peds  Hematology negative hematology ROS (+)   Anesthesia Other Findings   Reproductive/Obstetrics negative OB ROS                            Anesthesia Physical Anesthesia Plan  ASA: 3  Anesthesia Plan: General   Post-op Pain Management:    Induction: Intravenous  PONV Risk Score and Plan: 2 and Propofol infusion and Treatment may vary due to age or medical condition  Airway Management Planned: Mask  Additional Equipment:   Intra-op Plan:   Post-operative Plan:   Informed Consent: I have reviewed the patients History and Physical, chart, labs and discussed the procedure including the risks, benefits and alternatives for the proposed anesthesia with the patient or authorized representative who has indicated his/her understanding and acceptance.     Dental advisory given  Plan Discussed with: CRNA  Anesthesia Plan Comments:         Anesthesia Quick Evaluation

## 2021-07-25 NOTE — Discharge Instructions (Signed)

## 2021-07-26 ENCOUNTER — Encounter (HOSPITAL_COMMUNITY): Payer: Self-pay | Admitting: Cardiovascular Disease

## 2021-07-26 LAB — POCT I-STAT, CHEM 8
BUN: 7 mg/dL (ref 6–20)
Calcium, Ion: 1.52 mmol/L (ref 1.15–1.40)
Chloride: 104 mmol/L (ref 98–111)
Creatinine, Ser: 0.8 mg/dL (ref 0.61–1.24)
Glucose, Bld: 98 mg/dL (ref 70–99)
HCT: 47 % (ref 39.0–52.0)
Hemoglobin: 16 g/dL (ref 13.0–17.0)
Potassium: 4.4 mmol/L (ref 3.5–5.1)
Sodium: 140 mmol/L (ref 135–145)
TCO2: 26 mmol/L (ref 22–32)

## 2021-07-31 DIAGNOSIS — G4733 Obstructive sleep apnea (adult) (pediatric): Secondary | ICD-10-CM | POA: Diagnosis not present

## 2021-08-01 ENCOUNTER — Other Ambulatory Visit: Payer: Self-pay

## 2021-08-01 ENCOUNTER — Encounter (HOSPITAL_COMMUNITY): Payer: Self-pay | Admitting: Physician Assistant

## 2021-08-01 ENCOUNTER — Ambulatory Visit (HOSPITAL_COMMUNITY): Payer: 59 | Admitting: Physician Assistant

## 2021-08-01 ENCOUNTER — Ambulatory Visit (HOSPITAL_COMMUNITY)
Admission: RE | Admit: 2021-08-01 | Discharge: 2021-08-01 | Disposition: A | Discharge status: Home / Self Care | Payer: 59 | Source: Ambulatory Visit | Attending: Physician Assistant | Admitting: Physician Assistant

## 2021-08-01 VITALS — BP 138/76 | HR 72 | Ht 72.0 in | Wt >= 6400 oz

## 2021-08-01 DIAGNOSIS — M549 Dorsalgia, unspecified: Secondary | ICD-10-CM | POA: Diagnosis not present

## 2021-08-01 DIAGNOSIS — I10 Essential (primary) hypertension: Secondary | ICD-10-CM | POA: Insufficient documentation

## 2021-08-01 DIAGNOSIS — Z79899 Other long term (current) drug therapy: Secondary | ICD-10-CM | POA: Insufficient documentation

## 2021-08-01 DIAGNOSIS — Z9989 Dependence on other enabling machines and devices: Secondary | ICD-10-CM | POA: Diagnosis not present

## 2021-08-01 DIAGNOSIS — G4733 Obstructive sleep apnea (adult) (pediatric): Secondary | ICD-10-CM | POA: Insufficient documentation

## 2021-08-01 DIAGNOSIS — Z6841 Body Mass Index (BMI) 40.0 and over, adult: Secondary | ICD-10-CM | POA: Diagnosis not present

## 2021-08-01 DIAGNOSIS — Z9682 Presence of neurostimulator: Secondary | ICD-10-CM | POA: Diagnosis not present

## 2021-08-01 DIAGNOSIS — I4892 Unspecified atrial flutter: Secondary | ICD-10-CM | POA: Diagnosis not present

## 2021-08-01 DIAGNOSIS — I484 Atypical atrial flutter: Secondary | ICD-10-CM | POA: Diagnosis not present

## 2021-08-01 DIAGNOSIS — G8929 Other chronic pain: Secondary | ICD-10-CM | POA: Diagnosis not present

## 2021-08-01 DIAGNOSIS — Z7901 Long term (current) use of anticoagulants: Secondary | ICD-10-CM | POA: Insufficient documentation

## 2021-08-01 NOTE — Progress Notes (Signed)
Primary Care Physician: Dois Davenport, MD Primary Cardiologist: Dr Rennis Golden  Primary Electrophysiologist: none Referring Physician: Edd Fabian NP   Charles Barnett is a 55 y.o. male with a history of morbid obesity, OSA, atrial flutter, HTN, chronic back pain s/p spinal stimulator who presents for follow up in the [facility].  The patient was initially diagnosed with atrial flutter in 2018 after presenting to urgent care with symptoms of SOB and fatigue. He was started on Xarelto for a CHADS2VASC score of 1 and underwent DCCV on 01/30/17. He was found to be back in atrial flutter on follow up 06/04/21 and had another DCCV on 06/27/21. Unfortunately, he had quick return of his atrial flutter. He was started on Multaq and had repeat DCCV on 07/25/21.  On follow up today, patient remains in SR. He feels well today and is trying to be more active. He has lost ~ 3 lbs since his last visit. No bleeding issues with anticoagulation.   Today, he denies symptoms of palpitations, chest pain, shortness of breath, orthopnea, PND, lower extremity edema, dizziness, presyncope, syncope, bleeding, or neurologic sequela. The patient is tolerating medications without difficulties and is otherwise without complaint today.    Atrial Fibrillation Risk Factors:  he does have symptoms or diagnosis of sleep apnea. he is compliant with CPAP therapy. he does not have a history of rheumatic fever.   he has a BMI of Body mass index is 61.44 kg/m.Marland Kitchen Filed Weights   08/01/21 1440  Weight: (!) 205.5 kg     Family History  Problem Relation Age of Onset   Diabetes Mother    Hyperlipidemia Mother    Hypertension Mother    Heart disease Mother    Diabetes Sister    Hypertension Sister    Heart disease Sister    Hyperparathyroidism Neg Hx      Atrial Fibrillation Management history:  Previous antiarrhythmic drugs: Multaq Previous cardioversions: 01/30/17, 06/27/21,  07/25/21 Previous ablations: none CHADS2VASC score: 1 Anticoagulation history: Xarelto    Past Medical History:  Diagnosis Date   Arthritis    Atrial flutter (HCC) 12/2016   Hypertension    Neuromuscular disorder (HCC)    Sleep apnea    Past Surgical History:  Procedure Laterality Date   CARDIOVERSION N/A 01/30/2017   Procedure: CARDIOVERSION;  Surgeon: Wendall Stade, MD;  Location: [facility] ENDOSCOPY;  Service: Cardiovascular;  Laterality: N/A;   CARDIOVERSION N/A 06/27/2021   Procedure: CARDIOVERSION;  Surgeon: Chrystie Nose, MD;  Location: [facility] ENDOSCOPY;  Service: Cardiovascular;  Laterality: N/A;   CARDIOVERSION N/A 07/25/2021   Procedure: CARDIOVERSION;  Surgeon: Chilton Si, MD;  Location: [facility] ENDOSCOPY;  Service: Cardiovascular;  Laterality: N/A;   SPINE SURGERY      Current Outpatient Medications  Medication Sig Dispense Refill   amLODipine (NORVASC) 5 MG tablet Take 1 tablet (5 mg total) by mouth daily. 180 tablet 3   baclofen (LIORESAL) 10 MG tablet Take 10 mg by mouth 3 (three) times daily as needed for muscle spasms.     Budeson-Glycopyrrol-Formoterol (BREZTRI AEROSPHERE) 160-9-4.8 MCG/ACT AERO Inhale 2 puffs by mouth 2 times daily 10.7 g 0   dronedarone (MULTAQ) 400 MG tablet Take 1 tablet (400 mg total) by mouth 2 (two) times daily with a meal. 60 tablet 3   ergocalciferol (VITAMIN D2) 1.25 MG (50000 UT) capsule Take 1 capsule (1.25 mg) by mouth twice weekly 24 capsule 3   ipratropium-albuterol (DUONEB) 0.5-2.5 (3) MG/3ML SOLN use  3 ml via nebulizer every 6 hours as needed 90 mL 11   metoprolol tartrate (LOPRESSOR) 50 MG tablet Take 1 tablet (50 mg) by mouth 2 times per day with food 180 tablet 3   naproxen sodium (ALEVE) 220 MG tablet Take 440 mg by mouth 2 (two) times daily as needed (pain).     rivaroxaban (XARELTO) 20 MG TABS tablet TAKE 1 TABLET BY MOUTH DAILY WITH FOOD 90 tablet 3   spironolactone (ALDACTONE) 25 MG tablet Take 1 tablet by mouth daily 90 tablet  3   No current facility-administered medications for this encounter.    Allergies  Allergen Reactions   Ibuprofen Other (See Comments)    Bloody stool   Iodine Swelling    Including contrast dye     Social History   Socioeconomic History   Marital status: Married    Spouse name: Not on file   Number of children: 3   Years of education: Not on file   Highest education level: Not on file  Occupational History   Not on file  Tobacco Use   Smoking status: Never   Smokeless tobacco: Former    Types: Chew   Tobacco comments:    Former Chew Tobacco 07/18/2021  Vaping Use   Vaping Use: Never used  Substance and Sexual Activity   Alcohol use: No    Alcohol/week: 0.0 standard drinks   Drug use: No   Sexual activity: Yes    Partners: Female  Other Topics Concern   Not on file  Social History Narrative   Not on file   Social Determinants of Health   Financial Resource Strain: Not on file  Food Insecurity: Not on file  Transportation Needs: Not on file  Physical Activity: Not on file  Stress: Not on file  Social Connections: Not on file  Intimate Partner Violence: Not on file     ROS- All systems are reviewed and negative except as per the HPI above.  Physical Exam: Vitals:   08/01/21 1440  BP: 138/76  Pulse: 72  Weight: (!) 205.5 kg  Height: 6' (1.829 m)    GEN- The patient is a well appearing obese male, alert and oriented x 3 today.   HEENT-head normocephalic, atraumatic, sclera clear, conjunctiva pink, hearing intact, trachea midline. Lungs- Clear to ausculation bilaterally, normal work of breathing Heart- Regular rate and rhythm, no murmurs, rubs or gallops  GI- soft, NT, ND, + BS Extremities- no clubbing, cyanosis, or edema MS- no significant deformity or atrophy Skin- no rash or lesion Psych- euthymic mood, full affect Neuro- strength and sensation are intact   Wt Readings from Last 3 Encounters:  08/01/21 (!) 205.5 kg  07/25/21 (!) 207.2 kg   07/18/21 (!) 207.2 kg    EKG today demonstrates  SR, 1st degree AV block Vent. rate 72 BPM PR interval 238 ms QRS duration 110 ms QT/QTcB 400/438 ms  Echo 01/08/17 demonstrated  - Left ventricle: The cavity size was normal. Wall thickness was    increased in a pattern of mild LVH. Systolic function was normal.    The estimated ejection fraction was in the range of 55% to 60%.    Indeterminant diastolic function (atrial fibrillation). Although    no diagnostic regional wall motion abnormality was identified,    this possibility cannot be completely excluded on the basis of    this study.  - Ventricular septum: Mildly D-shaped interventricular septum,    suggestive of RV pressure/volume overload.  -  Aortic valve: There was no stenosis.  - Mitral valve: There was no significant regurgitation.  - Right ventricle: The cavity size was mildly to moderately    dilated. Systolic function was normal.  - Right atrium: The atrium was mildly dilated.  - Atrial septum: Atrial septal aneurysm noted.  - Pulmonary arteries: No complete TR doppler jet so unable to    estimate PA systolic pressure.  - Systemic veins: IVC was not visualized.   Impressions:   - Technically difficult study with poor acoustic windows. Normal LV    size with mild LV hypertrophy. EF 55-60%. Mildly D-shaped    interventricular septum is suggestive of a degree of RV    pressure/volume overload. Mild to moderate RV dilation with    normal systolic function. Unable to estimate PA systolic pressure    on this study.   Epic records are reviewed at length today  CHA2DS2-VASc Score = 1  The patient's score is based upon: CHF History: 0 HTN History: 1 Diabetes History: 0 Stroke History: 0 Vascular Disease History: 0 Age Score: 0 Gender Score: 0       ASSESSMENT AND PLAN: 1. Atrial flutter The patient's CHA2DS2-VASc score is 1, indicating a 0.6% annual risk of stroke.   S/p DCCV 07/25/21 Would avoid class IC  with LAFB and inc RBBB on ECG. He is young for amiodarone.  Patient appears to be maintaining SR. Continue Multaq 400 mg BID. Check cmet on follow up. Dofetilide would be the next option if Multaq is ineffective.  Continue Xarelto 20 mg daily Continue Lopressor 50 mg BID  2. Morbid Obesity Body mass index is 61.44 kg/m. Lifestyle modification was discussed and encouraged including regular physical activity and weight reduction. Suspect this is significantly contributing to his arrhythmias.  Patient declined referral to [facility].   3. Obstructive sleep apnea Patient reports compliance with CPAP therapy.  4. HTN Stable, no changes today.   Follow up with Edd Fabian as scheduled. AF clinic in 3 months.    Jorja Loa PA-C Afib Clinic [facility] [address] [place], [place] [address] [phone] 08/01/2021 2:49 PM

## 2021-08-02 ENCOUNTER — Other Ambulatory Visit (HOSPITAL_BASED_OUTPATIENT_CLINIC_OR_DEPARTMENT_OTHER): Payer: Self-pay

## 2021-08-03 ENCOUNTER — Other Ambulatory Visit (HOSPITAL_BASED_OUTPATIENT_CLINIC_OR_DEPARTMENT_OTHER): Payer: Self-pay

## 2021-08-03 MED ORDER — BUDESON-GLYCOPYRROL-FORMOTEROL 160-9-4.8 MCG/ACT IN AERO
2.0000 | INHALATION_SPRAY | Freq: Two times a day (BID) | RESPIRATORY_TRACT | 0 refills | Status: DC
  Filled 2021-08-03: qty 10.7, 30d supply, fill #0

## 2021-08-07 LAB — COLOGUARD

## 2021-08-13 NOTE — Progress Notes (Signed)
Cardiology Clinic Note   Patient Name: Charles Barnett Date of Encounter: 08/15/2021  Primary Care Provider:  Hayden Rasmussen, MD Primary Cardiologist:  Pixie Casino, MD  Patient Profile    Charles Barnett 55 year old male presents the clinic today for follow-up evaluation of his atrial flutter.  Past Medical History    Past Medical History:  Diagnosis Date   Arthritis    Atrial flutter ([place]) 12/2016   Hypertension    Neuromuscular disorder ([place])    Sleep apnea    Past Surgical History:  Procedure Laterality Date   CARDIOVERSION N/A 01/30/2017   Procedure: CARDIOVERSION;  Surgeon: Josue Hector, MD;  Location: [facility] ENDOSCOPY;  Service: Cardiovascular;  Laterality: N/A;   CARDIOVERSION N/A 06/27/2021   Procedure: CARDIOVERSION;  Surgeon: Pixie Casino, MD;  Location: [facility] ENDOSCOPY;  Service: Cardiovascular;  Laterality: N/A;   CARDIOVERSION N/A 07/25/2021   Procedure: CARDIOVERSION;  Surgeon: Skeet Latch, MD;  Location: [facility] ENDOSCOPY;  Service: Cardiovascular;  Laterality: N/A;   SPINE SURGERY      Allergies  Allergies  Allergen Reactions   Ibuprofen Other (See Comments)    Bloody stool   Iodine Swelling    Including contrast dye     History of Present Illness    Charles Barnett is a PMH of morbid obesity, OSA on CPAP, atrial flutter, arthritis, HTN, neuromuscular disorder, and chronic back pain (spinal stimulator, narcotic pain medication).  He was diagnosed with atrial flutter in 2018.  He had presented to urgent care with shortness of breath and fatigue.  He was placed on Xarelto and Cardizem.  His Cardizem was transitioned to Lopressor.  His echocardiogram 4/18 showed an EF of 55-60%, mild LVH, mild dilated right atrium, atrial septal aneurysm.  He underwent successful DCCV.  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  He reported compliance with CPAP.  He was seen by Almyra Deforest, PA-C on 06/04/2021.  During  that time he presented for 93-month follow-up.  His EKG showed atrial flutter with rate control.  He was cardiac unaware.  He was not sure when he had gone back into atrial flutter.  He reported chronic dyspnea with exertion and fatigue that were unchanged.  His back and knee pain prevented him from being very physically active.  Case was discussed with DOD and DCCV was recommended.  He reported that he had not missed doses of his Xarelto over the previous several months.  Follow-up was planned for 5 weeks after his DCCV.  He denied chest discomfort and was euvolemic on exam.  He underwent successful DCCV on 06/27/2021.  He received 3 shocks before converting to sinus rhythm.  He presented to the clinic 07/10/2021 for follow-up evaluation stated he noted a headache yesterday and chest discomfort with bending over.  He said the pain lasted for a few minutes and dissipated without intervention.  We reviewed his EKG today which shows atrial flutter 92 bpm and options for management.  I contacted his wife over the phone.  They did not wish to proceed with repeat DCCV at the time and wanted to be referred to the atrial fibrillation clinic.  He reported compliance with his Xarelto and denied bleeding issues.  He remained physically active at home and was cardiac unaware.  His blood pressure was elevated  at 142/86.  I will stopped his losartan and added valsartan.  We will ordered a BMP in 1 week, gave a salty 6 diet  sheet, referred to the atrial fibrillation clinic, and planned follow-up in 1 month.  l gave him a blood pressure log.  He was seen in follow-up in the atrial fibrillation clinic 08/01/2021.  His blood pressure was 138/76.  He remained in sinus rhythm.  He had been placed on Multaq and underwent repeat DCCV on 07/25/2021.  He had also lost about 3 pounds.  He denied bleeding issues with his Xarelto.  He denied shortness of breath and chest discomfort.   He presents the clinic today for follow-up  evaluation and states he had some dietary indiscretion over the holidays.  His weight today is up 10 pounds.  He does not appear to be fluid volume overloaded.  He denies episodes of accelerated or irregular heartbeats.  He continues to be compliant with his Xarelto and denies bleeding issues.His blood pressure initially today is 154/74 and on recheck it is 142/82.  I will increase his amlodipine to 7.5 mg daily, continue heart healthy low-sodium diet, continue increased physical activity, continue weight loss, and plan follow-up for 3 to 4 months.   Today he denies chest pain, shortness of breath, lower extremity edema, fatigue, palpitations, melena, hematuria, hemoptysis, diaphoresis, weakness, presyncope, syncope, orthopnea, and PND.   Home Medications    Prior to Admission medications   Medication Sig Start Date End Date Taking? Authorizing Provider  baclofen (LIORESAL) 10 MG tablet Take 10 mg by mouth 3 (three) times daily as needed for muscle spasms. 03/09/19   [provider]  Budeson-Glycopyrrol-Formoterol (BREZTRI AEROSPHERE) 160-9-4.8 MCG/ACT AERO Inhale 2 puffs by mouth 2 times daily 05/15/21     ergocalciferol (VITAMIN D2) 1.25 MG (50000 UT) capsule Take 1 capsule (1.25 mg) by mouth twice weekly 05/10/21     ipratropium-albuterol (DUONEB) 0.5-2.5 (3) MG/3ML SOLN use 3 ml via nebulizer every 6 hours as needed 02/02/21     losartan (COZAAR) 50 MG tablet Take 1 tablet (50 mg total) by mouth daily. 03/18/19   Hilty, Lisette Abu, MD  metoprolol tartrate (LOPRESSOR) 50 MG tablet Take 1 tablet (50 mg) by mouth 2 times per day with food 06/04/21   Azalee Course, PA  naproxen sodium (ALEVE) 220 MG tablet Take 440 mg by mouth 2 (two) times daily as needed (pain).    [provider]  rivaroxaban (XARELTO) 20 MG TABS tablet TAKE 1 TABLET BY MOUTH DAILY WITH FOOD 06/04/21 06/04/22  Azalee Course, PA  spironolactone (ALDACTONE) 25 MG tablet Take 1 tablet by mouth daily 06/04/21   Azalee Course, PA     Family History    Family History  Problem Relation Age of Onset   Diabetes Mother    Hyperlipidemia Mother    Hypertension Mother    Heart disease Mother    Diabetes Sister    Hypertension Sister    Heart disease Sister    Hyperparathyroidism Neg Hx    He indicated that his mother is alive. He indicated that his father is deceased. He indicated that his sister is alive. He indicated that his brother is deceased. He indicated that his maternal grandmother is deceased. He indicated that his maternal grandfather is deceased. He indicated that his paternal grandmother is deceased. He indicated that his paternal grandfather is deceased. He indicated that the status of his neg hx is unknown.  Social History    Social History   Socioeconomic History   Marital status: Married    Spouse name: Not on file   Number of children: 3   Years  of education: Not on file   Highest education level: Not on file  Occupational History   Not on file  Tobacco Use   Smoking status: Never   Smokeless tobacco: Former    Types: Chew   Tobacco comments:    Former Chew Tobacco 07/18/2021  Vaping Use   Vaping Use: Never used  Substance and Sexual Activity   Alcohol use: No    Alcohol/week: 0.0 standard drinks   Drug use: No   Sexual activity: Yes    Partners: Female  Other Topics Concern   Not on file  Social History Narrative   Not on file   Social Determinants of Health   Financial Resource Strain: Not on file  Food Insecurity: Not on file  Transportation Needs: Not on file  Physical Activity: Not on file  Stress: Not on file  Social Connections: Not on file  Intimate Partner Violence: Not on file     Review of Systems    General:  No chills, fever, night sweats or weight changes.  Cardiovascular:  No chest pain, dyspnea on exertion, edema, orthopnea, palpitations, paroxysmal nocturnal dyspnea. Dermatological: No rash, lesions/masses Respiratory: No cough, dyspnea Urologic: No  hematuria, dysuria Abdominal:   No nausea, vomiting, diarrhea, bright red blood per rectum, melena, or hematemesis Neurologic:  No visual changes, wkns, changes in mental status. All other systems reviewed and are otherwise negative except as noted above.  Physical Exam    VS:  BP (!) 142/82   Pulse 83   Ht 6' (1.829 m)   Wt (!) 466 lb 9.6 oz (211.6 kg)   SpO2 99%   BMI 63.28 kg/m  , BMI Body mass index is 63.28 kg/m. GEN: Well nourished, well developed, in no acute distress. HEENT: normal. Neck: Supple, no JVD, carotid bruits, or masses. Cardiac: RRR, no murmurs, rubs, or gallops. No clubbing, cyanosis, edema.  Radials/DP/PT 2+ and equal bilaterally.  Respiratory:  Respirations regular and unlabored, clear to auscultation bilaterally. GI: Soft, nontender, nondistended, BS + x 4. MS: no deformity or atrophy. Skin: warm and dry, no rash. Neuro:  Strength and sensation are intact. Psych: Normal affect.  Accessory Clinical Findings    Recent Labs: 07/18/2021: ALT 20; Platelets 260 07/25/2021: BUN 7; Creatinine, Ser 0.80; Hemoglobin 16.0; Potassium 4.4; Sodium 140   Recent Lipid Panel    Component Value Date/Time   CHOL 176 08/08/2015 1348   TRIG 96 08/08/2015 1348   HDL 43 08/08/2015 1348   CHOLHDL 4.1 08/08/2015 1348   VLDL 19 08/08/2015 1348   LDLCALC 114 08/08/2015 1348    ECG personally reviewed by me today-atrial flutter with variable AV block with premature aberrantly conducted complexes left axis deviation, incomplete right bundle branch block, inferior infarct undetermined age 17 bpm- No acute changes  Echocardiogram 01/08/2017  Study Conclusions   - Left ventricle: The cavity size was normal. Wall thickness was    increased in a pattern of mild LVH. Systolic function was normal.    The estimated ejection fraction was in the range of 55% to 60%.    Indeterminant diastolic function (atrial fibrillation). Although    no diagnostic regional wall motion abnormality  was identified,    this possibility cannot be completely excluded on the basis of    this study.  - Ventricular septum: Mildly D-shaped interventricular septum,    suggestive of RV pressure/volume overload.  - Aortic valve: There was no stenosis.  - Mitral valve: There was no significant regurgitation.  -  Right ventricle: The cavity size was mildly to moderately    dilated. Systolic function was normal.  - Right atrium: The atrium was mildly dilated.  - Atrial septum: Atrial septal aneurysm noted.  - Pulmonary arteries: No complete TR doppler jet so unable to    estimate PA systolic pressure.  - Systemic veins: IVC was not visualized.  Assessment & Plan   1.  Atrial flutter-heart rate today 83 .  Now following with A. fib clinic.  Underwent DCCV x3 on 06/27/2021 he was successfully converted to sinus rhythm after his third shock.  CHA2DS2-VASc score 1 for HTN.  He underwent repeat DCCV on 07/25/2021 after being started on Multaq.  His DCCV was successful. Continue Xarelto, Multaq, metoprolol Heart healthy low-sodium diet-salty 6 given Increase physical activity as tolerated Avoid triggers caffeine, chocolate, EtOH, dehydration etc.  Obstructive sleep apnea-continues reported compliance with CPAP. Continue CPAP use Weight loss encouraged  Essential hypertension-BP today 142/82.  Well-controlled at home. Continue metoprolol, spironolactone, amlodipine Heart healthy low-sodium diet-salty 6 given Increase physical activity as tolerated  Morbid obesity-weight today 466. Heart healthy low-sodium diet Increase physical activity as tolerated Continue weight loss  Disposition: Follow-up with Dr. Debara Pickett or APP in 3-4 months.    Jossie Ng. Norrine Ballester NP-C    08/15/2021, 1:50 PM [facility] [place] [address] Office [phone] Fax [phone]  Notice: This dictation was prepared with Dragon dictation along with smaller phrase technology. Any  transcriptional errors that result from this process are unintentional and may not be corrected upon review.  I spent 14 minutes examining this patient, reviewing medications, and using patient centered shared decision making involving her cardiac care.  Prior to her visit I spent greater than 20 minutes reviewing her past medical history,  medications, and prior cardiac tests.

## 2021-08-15 ENCOUNTER — Other Ambulatory Visit: Payer: Self-pay

## 2021-08-15 ENCOUNTER — Other Ambulatory Visit (HOSPITAL_BASED_OUTPATIENT_CLINIC_OR_DEPARTMENT_OTHER): Payer: Self-pay

## 2021-08-15 ENCOUNTER — Ambulatory Visit: Payer: 59 | Admitting: General Practice

## 2021-08-15 ENCOUNTER — Encounter: Payer: Self-pay | Admitting: General Practice

## 2021-08-15 VITALS — BP 142/82 | HR 83 | Ht 72.0 in | Wt >= 6400 oz

## 2021-08-15 DIAGNOSIS — G4733 Obstructive sleep apnea (adult) (pediatric): Secondary | ICD-10-CM

## 2021-08-15 DIAGNOSIS — I10 Essential (primary) hypertension: Secondary | ICD-10-CM | POA: Diagnosis not present

## 2021-08-15 DIAGNOSIS — I484 Atypical atrial flutter: Secondary | ICD-10-CM

## 2021-08-15 MED ORDER — AMLODIPINE BESYLATE 5 MG PO TABS
7.5000 mg | ORAL_TABLET | Freq: Every day | ORAL | 3 refills | Status: DC
  Filled 2021-08-15 – 2021-09-20 (×3): qty 45, 30d supply, fill #0
  Filled 2021-10-18: qty 45, 30d supply, fill #1
  Filled 2021-11-20: qty 45, 30d supply, fill #2
  Filled 2021-12-19: qty 45, 30d supply, fill #3

## 2021-08-15 NOTE — Patient Instructions (Signed)
Medication Instructions:  INCREASE AMLODIPINE 7.5MG  (1-1/2 TAB) DAILY  *If you need a refill on your cardiac medications before your next appointment, please call your pharmacy*  Lab Work:   Testing/Procedures:  NONE    NONE  Special Instructions PLEASE READ AND FOLLOW SALTY 6-ATTACHED-1,800mg  daily  PLEASE INCREASE PHYSICAL ACTIVITY AS TOLERATED   TAKE AND LOG YOUR BLOOD PRESSURE DAILY  CONTINUE WEIGHT LOSS  Follow-Up: Your next appointment:  3-4 month(s) In Person with Chrystie Nose, MD  or Edd Fabian, FNP      At [facility], you and your health needs are our priority.  As part of our continuing mission to provide you with exceptional heart care, we have created designated Provider Care Teams.  These Care Teams include your primary Cardiologist (physician) and Advanced Practice Providers (APPs -  Physician Assistants and Nurse Practitioners) who all work together to provide you with the care you need, when you need it.            6 SALTY THINGS TO AVOID     1,800MG  DAILY

## 2021-08-21 ENCOUNTER — Other Ambulatory Visit (HOSPITAL_BASED_OUTPATIENT_CLINIC_OR_DEPARTMENT_OTHER): Payer: Self-pay

## 2021-08-22 DIAGNOSIS — R0902 Hypoxemia: Secondary | ICD-10-CM | POA: Diagnosis not present

## 2021-08-22 DIAGNOSIS — G4733 Obstructive sleep apnea (adult) (pediatric): Secondary | ICD-10-CM | POA: Diagnosis not present

## 2021-09-03 ENCOUNTER — Other Ambulatory Visit (HOSPITAL_BASED_OUTPATIENT_CLINIC_OR_DEPARTMENT_OTHER): Payer: Self-pay

## 2021-09-12 ENCOUNTER — Other Ambulatory Visit (HOSPITAL_BASED_OUTPATIENT_CLINIC_OR_DEPARTMENT_OTHER): Payer: Self-pay

## 2021-09-12 DIAGNOSIS — J189 Pneumonia, unspecified organism: Secondary | ICD-10-CM | POA: Diagnosis not present

## 2021-09-12 DIAGNOSIS — R062 Wheezing: Secondary | ICD-10-CM | POA: Diagnosis not present

## 2021-09-12 DIAGNOSIS — R059 Cough, unspecified: Secondary | ICD-10-CM | POA: Diagnosis not present

## 2021-09-12 MED ORDER — ALBUTEROL SULFATE HFA 108 (90 BASE) MCG/ACT IN AERS
INHALATION_SPRAY | RESPIRATORY_TRACT | 0 refills | Status: DC
  Filled 2021-09-12: qty 18, 30d supply, fill #0

## 2021-09-12 MED ORDER — DOXYCYCLINE HYCLATE 100 MG PO CAPS
ORAL_CAPSULE | ORAL | 0 refills | Status: DC
  Filled 2021-09-12: qty 20, 10d supply, fill #0

## 2021-09-12 MED ORDER — PREDNISONE 20 MG PO TABS
ORAL_TABLET | ORAL | 0 refills | Status: DC
  Filled 2021-09-12: qty 14, 10d supply, fill #0

## 2021-09-13 DIAGNOSIS — M25561 Pain in right knee: Secondary | ICD-10-CM | POA: Diagnosis not present

## 2021-09-13 DIAGNOSIS — M25562 Pain in left knee: Secondary | ICD-10-CM | POA: Diagnosis not present

## 2021-09-13 DIAGNOSIS — M170 Bilateral primary osteoarthritis of knee: Secondary | ICD-10-CM | POA: Diagnosis not present

## 2021-09-20 ENCOUNTER — Other Ambulatory Visit (HOSPITAL_BASED_OUTPATIENT_CLINIC_OR_DEPARTMENT_OTHER): Payer: Self-pay

## 2021-09-22 DIAGNOSIS — R0902 Hypoxemia: Secondary | ICD-10-CM | POA: Diagnosis not present

## 2021-09-22 DIAGNOSIS — G4733 Obstructive sleep apnea (adult) (pediatric): Secondary | ICD-10-CM | POA: Diagnosis not present

## 2021-10-10 DIAGNOSIS — Z1211 Encounter for screening for malignant neoplasm of colon: Secondary | ICD-10-CM | POA: Diagnosis not present

## 2021-10-17 DIAGNOSIS — M170 Bilateral primary osteoarthritis of knee: Secondary | ICD-10-CM | POA: Diagnosis not present

## 2021-10-17 LAB — COLOGUARD: COLOGUARD: POSITIVE — AB

## 2021-10-18 ENCOUNTER — Other Ambulatory Visit (HOSPITAL_BASED_OUTPATIENT_CLINIC_OR_DEPARTMENT_OTHER): Payer: Self-pay

## 2021-10-23 DIAGNOSIS — G4733 Obstructive sleep apnea (adult) (pediatric): Secondary | ICD-10-CM | POA: Diagnosis not present

## 2021-10-23 DIAGNOSIS — R0902 Hypoxemia: Secondary | ICD-10-CM | POA: Diagnosis not present

## 2021-10-24 DIAGNOSIS — M25562 Pain in left knee: Secondary | ICD-10-CM | POA: Diagnosis not present

## 2021-10-24 DIAGNOSIS — M170 Bilateral primary osteoarthritis of knee: Secondary | ICD-10-CM | POA: Diagnosis not present

## 2021-10-24 DIAGNOSIS — G4733 Obstructive sleep apnea (adult) (pediatric): Secondary | ICD-10-CM | POA: Diagnosis not present

## 2021-10-24 DIAGNOSIS — M25561 Pain in right knee: Secondary | ICD-10-CM | POA: Diagnosis not present

## 2021-10-31 ENCOUNTER — Ambulatory Visit (HOSPITAL_COMMUNITY)
Admission: RE | Admit: 2021-10-31 | Discharge: 2021-10-31 | Disposition: A | Discharge status: Home / Self Care | Payer: 59 | Source: Ambulatory Visit | Attending: Physician Assistant | Admitting: Physician Assistant

## 2021-10-31 ENCOUNTER — Other Ambulatory Visit: Payer: Self-pay

## 2021-10-31 ENCOUNTER — Encounter (HOSPITAL_COMMUNITY): Payer: Self-pay | Admitting: Physician Assistant

## 2021-10-31 VITALS — BP 142/80 | HR 65 | Ht 72.0 in | Wt >= 6400 oz

## 2021-10-31 DIAGNOSIS — G4733 Obstructive sleep apnea (adult) (pediatric): Secondary | ICD-10-CM | POA: Insufficient documentation

## 2021-10-31 DIAGNOSIS — I10 Essential (primary) hypertension: Secondary | ICD-10-CM | POA: Diagnosis not present

## 2021-10-31 DIAGNOSIS — Z9989 Dependence on other enabling machines and devices: Secondary | ICD-10-CM | POA: Diagnosis not present

## 2021-10-31 DIAGNOSIS — Z6841 Body Mass Index (BMI) 40.0 and over, adult: Secondary | ICD-10-CM | POA: Diagnosis not present

## 2021-10-31 DIAGNOSIS — M549 Dorsalgia, unspecified: Secondary | ICD-10-CM | POA: Insufficient documentation

## 2021-10-31 DIAGNOSIS — M170 Bilateral primary osteoarthritis of knee: Secondary | ICD-10-CM | POA: Diagnosis not present

## 2021-10-31 DIAGNOSIS — Z79899 Other long term (current) drug therapy: Secondary | ICD-10-CM | POA: Insufficient documentation

## 2021-10-31 DIAGNOSIS — I484 Atypical atrial flutter: Secondary | ICD-10-CM | POA: Diagnosis not present

## 2021-10-31 DIAGNOSIS — Z7901 Long term (current) use of anticoagulants: Secondary | ICD-10-CM | POA: Insufficient documentation

## 2021-10-31 DIAGNOSIS — I4892 Unspecified atrial flutter: Secondary | ICD-10-CM | POA: Insufficient documentation

## 2021-10-31 DIAGNOSIS — G8929 Other chronic pain: Secondary | ICD-10-CM | POA: Diagnosis not present

## 2021-10-31 LAB — COMPREHENSIVE METABOLIC PANEL
ALT: 18 U/L (ref 0–44)
AST: 22 U/L (ref 15–41)
Albumin: 3.7 g/dL (ref 3.5–5.0)
Alkaline Phosphatase: 53 U/L (ref 38–126)
Anion gap: 7 (ref 5–15)
BUN: 5 mg/dL — ABNORMAL LOW (ref 6–20)
CO2: 28 mmol/L (ref 22–32)
Calcium: 10.7 mg/dL — ABNORMAL HIGH (ref 8.9–10.3)
Chloride: 104 mmol/L (ref 98–111)
Creatinine, Ser: 0.87 mg/dL (ref 0.61–1.24)
GFR, Estimated: >60 mL/min (ref >60)
Glucose, Bld: 104 mg/dL — ABNORMAL HIGH (ref 70–99)
Potassium: 4.4 mmol/L (ref 3.5–5.1)
Sodium: 139 mmol/L (ref 135–145)
Total Bilirubin: 0.9 mg/dL (ref 0.3–1.2)
Total Protein: 7.2 g/dL (ref 6.5–8.1)

## 2021-10-31 NOTE — Progress Notes (Signed)
Primary Care Physician: Dois Davenport, MD Primary Cardiologist: Dr Rennis Golden  Primary Electrophysiologist: none Referring Physician: Edd Fabian NP   Charles Barnett is a 56 y.o. male with a history of morbid obesity, OSA, atrial flutter, HTN, chronic back pain s/p spinal stimulator who presents for follow up in the [facility].  The patient was initially diagnosed with atrial flutter in 2018 after presenting to urgent care with symptoms of SOB and fatigue. He was started on Xarelto for a CHADS2VASC score of 1 and underwent DCCV on 01/30/17. He was found to be back in atrial flutter on follow up 06/04/21 and had another DCCV on 06/27/21. Unfortunately, he had quick return of his atrial flutter. He was started on Multaq and had repeat DCCV on 07/25/21.  On follow up today, patient reports that he has done well since his last visit. He denies any heart racing or palpitations. No bleeding issues on anticoagulation.   Today, he denies symptoms of palpitations, chest pain, shortness of breath, orthopnea, PND, lower extremity edema, dizziness, presyncope, syncope, bleeding, or neurologic sequela. The patient is tolerating medications without difficulties and is otherwise without complaint today.    Atrial Fibrillation Risk Factors:  he does have symptoms or diagnosis of sleep apnea. he is compliant with CPAP therapy. he does not have a history of rheumatic fever.   he has a BMI of Body mass index is 63.36 kg/m.Marland Kitchen Filed Weights   10/31/21 1116  Weight: (!) 211.9 kg     Family History  Problem Relation Age of Onset   Diabetes Mother    Hyperlipidemia Mother    Hypertension Mother    Heart disease Mother    Diabetes Sister    Hypertension Sister    Heart disease Sister    Hyperparathyroidism Neg Hx      Atrial Fibrillation Management history:  Previous antiarrhythmic drugs: Multaq Previous cardioversions: 01/30/17, 06/27/21, 07/25/21 Previous  ablations: none CHADS2VASC score: 1 Anticoagulation history: Xarelto    Past Medical History:  Diagnosis Date   Arthritis    Atrial flutter (HCC) 12/2016   Hypertension    Neuromuscular disorder (HCC)    Sleep apnea    Past Surgical History:  Procedure Laterality Date   CARDIOVERSION N/A 01/30/2017   Procedure: CARDIOVERSION;  Surgeon: Wendall Stade, MD;  Location: [facility] ENDOSCOPY;  Service: Cardiovascular;  Laterality: N/A;   CARDIOVERSION N/A 06/27/2021   Procedure: CARDIOVERSION;  Surgeon: Chrystie Nose, MD;  Location: [facility] ENDOSCOPY;  Service: Cardiovascular;  Laterality: N/A;   CARDIOVERSION N/A 07/25/2021   Procedure: CARDIOVERSION;  Surgeon: Chilton Si, MD;  Location: [facility] ENDOSCOPY;  Service: Cardiovascular;  Laterality: N/A;   SPINE SURGERY      Current Outpatient Medications  Medication Sig Dispense Refill   albuterol (VENTOLIN HFA) 108 (90 Base) MCG/ACT inhaler Inhale 1 puff by mouth every 4 hours as needed 18 g 0   amLODipine (NORVASC) 5 MG tablet Take 1 & 1/2 tablets (7.5 mg total) by mouth daily. 45 tablet 3   baclofen (LIORESAL) 10 MG tablet Take 10 mg by mouth 3 (three) times daily as needed for muscle spasms.     Budeson-Glycopyrrol-Formoterol (BREZTRI AEROSPHERE) 160-9-4.8 MCG/ACT AERO Inhale 2 puffs into the lungs 2 (two) times daily. 10.7 g 0   doxycycline (VIBRAMYCIN) 100 MG capsule Take 1 capsule (100 mg) by mouth every 12 hours for 10 days 20 capsule 0   dronedarone (MULTAQ) 400 MG tablet Take 1 tablet (400 mg total)  by mouth 2 (two) times daily with a meal. 60 tablet 3   ergocalciferol (VITAMIN D2) 1.25 MG (50000 UT) capsule Take 1 capsule (1.25 mg) by mouth twice weekly 24 capsule 3   ipratropium-albuterol (DUONEB) 0.5-2.5 (3) MG/3ML SOLN use 3 ml via nebulizer every 6 hours as needed 90 mL 11   metoprolol tartrate (LOPRESSOR) 50 MG tablet Take 1 tablet (50 mg) by mouth 2 times per day with food 180 tablet 3   naproxen sodium (ALEVE) 220 MG tablet Take  440 mg by mouth 2 (two) times daily as needed (pain).     predniSONE (DELTASONE) 20 MG tablet Take 2 tablets (20 mg) by mouth daily x 4 days then 1 tablet by mouth daily x 6 days 14 tablet 0   rivaroxaban (XARELTO) 20 MG TABS tablet TAKE 1 TABLET BY MOUTH DAILY WITH FOOD 90 tablet 3   spironolactone (ALDACTONE) 25 MG tablet Take 1 tablet by mouth daily 90 tablet 3   No current facility-administered medications for this encounter.    Allergies  Allergen Reactions   Ibuprofen Other (See Comments)    Bloody stool   Iodine Swelling    Including contrast dye     Social History   Socioeconomic History   Marital status: Married    Spouse name: Not on file   Number of children: 3   Years of education: Not on file   Highest education level: Not on file  Occupational History   Not on file  Tobacco Use   Smoking status: Never   Smokeless tobacco: Former    Types: Chew   Tobacco comments:    Former Chew Tobacco 07/18/2021  Vaping Use   Vaping Use: Never used  Substance and Sexual Activity   Alcohol use: No    Alcohol/week: 0.0 standard drinks   Drug use: No   Sexual activity: Yes    Partners: Female  Other Topics Concern   Not on file  Social History Narrative   Not on file   Social Determinants of Health   Financial Resource Strain: Not on file  Food Insecurity: Not on file  Transportation Needs: Not on file  Physical Activity: Not on file  Stress: Not on file  Social Connections: Not on file  Intimate Partner Violence: Not on file     ROS- All systems are reviewed and negative except as per the HPI above.  Physical Exam: Vitals:   10/31/21 1116  BP: (!) 142/80  Pulse: 65  Weight: (!) 211.9 kg  Height: 6' (1.829 m)    GEN- The patient is a well appearing obese male, alert and oriented x 3 today.   HEENT-head normocephalic, atraumatic, sclera clear, conjunctiva pink, hearing intact, trachea midline. Lungs- Clear to ausculation bilaterally, normal work of  breathing Heart- Regular rate and rhythm, no murmurs, rubs or gallops  GI- soft, NT, ND, + BS Extremities- no clubbing, cyanosis, or edema MS- no significant deformity or atrophy Skin- no rash or lesion Psych- euthymic mood, full affect Neuro- strength and sensation are intact   Wt Readings from Last 3 Encounters:  10/31/21 (!) 211.9 kg  08/15/21 (!) 211.6 kg  08/01/21 (!) 205.5 kg    EKG today demonstrates  SR, 1st degree AV block Vent. rate 65 BPM PR interval 230 ms QRS duration 110 ms QT/QTcB 432/449 ms  Echo 01/08/17 demonstrated  - Left ventricle: The cavity size was normal. Wall thickness was    increased in a pattern of mild LVH. Systolic  function was normal.    The estimated ejection fraction was in the range of 55% to 60%.    Indeterminant diastolic function (atrial fibrillation). Although    no diagnostic regional wall motion abnormality was identified,    this possibility cannot be completely excluded on the basis of    this study.  - Ventricular septum: Mildly D-shaped interventricular septum,    suggestive of RV pressure/volume overload.  - Aortic valve: There was no stenosis.  - Mitral valve: There was no significant regurgitation.  - Right ventricle: The cavity size was mildly to moderately    dilated. Systolic function was normal.  - Right atrium: The atrium was mildly dilated.  - Atrial septum: Atrial septal aneurysm noted.  - Pulmonary arteries: No complete TR doppler jet so unable to    estimate PA systolic pressure.  - Systemic veins: IVC was not visualized.   Impressions:   - Technically difficult study with poor acoustic windows. Normal LV    size with mild LV hypertrophy. EF 55-60%. Mildly D-shaped    interventricular septum is suggestive of a degree of RV    pressure/volume overload. Mild to moderate RV dilation with    normal systolic function. Unable to estimate PA systolic pressure    on this study.   Epic records are reviewed at length  today  CHA2DS2-VASc Score = 1  The patient's score is based upon: CHF History: 0 HTN History: 1 Diabetes History: 0 Stroke History: 0 Vascular Disease History: 0 Age Score: 0 Gender Score: 0        ASSESSMENT AND PLAN: 1. Atrial flutter The patient's CHA2DS2-VASc score is 1, indicating a 0.6% annual risk of stroke.   S/p DCCV 07/25/21 Would avoid class IC with LAFB and inc RBBB on ECG.  Patient appears to be maintaining SR. Continue Multaq 400 mg BID.  Check cmet today. Dofetilide would be the next option if Multaq is ineffective.  Continue Xarelto 20 mg daily Continue Lopressor 50 mg BID  2. Morbid Obesity Body mass index is 63.36 kg/m. Lifestyle modification was discussed and encouraged including regular physical activity and weight reduction. Suspect this is significantly contributing to his arrhythmias.  Patient declined referral to [facility].   3. Obstructive sleep apnea Patient reports compliance with CPAP therapy.  4. HTN Stable, no changes today.   Follow up in the AF clinic in 6 months.    [facility] [address] [place], [place] [address] [phone] 10/31/2021 11:32 AM

## 2021-11-19 ENCOUNTER — Other Ambulatory Visit (HOSPITAL_BASED_OUTPATIENT_CLINIC_OR_DEPARTMENT_OTHER): Payer: Self-pay

## 2021-11-19 ENCOUNTER — Other Ambulatory Visit (HOSPITAL_COMMUNITY): Payer: Self-pay | Admitting: Physician Assistant

## 2021-11-19 MED ORDER — MULTAQ 400 MG PO TABS
400.0000 mg | ORAL_TABLET | Freq: Two times a day (BID) | ORAL | 6 refills | Status: DC
  Filled 2021-11-19: qty 60, 30d supply, fill #0
  Filled 2021-12-19: qty 60, 30d supply, fill #1
  Filled 2022-01-17: qty 60, 30d supply, fill #2
  Filled 2022-02-18: qty 60, 30d supply, fill #3
  Filled 2022-03-20: qty 60, 30d supply, fill #4
  Filled 2022-04-18: qty 60, 30d supply, fill #5
  Filled 2022-05-23: qty 60, 30d supply, fill #6

## 2021-11-20 ENCOUNTER — Other Ambulatory Visit (HOSPITAL_BASED_OUTPATIENT_CLINIC_OR_DEPARTMENT_OTHER): Payer: Self-pay

## 2021-11-21 ENCOUNTER — Other Ambulatory Visit (HOSPITAL_BASED_OUTPATIENT_CLINIC_OR_DEPARTMENT_OTHER): Payer: Self-pay

## 2021-11-21 NOTE — Progress Notes (Unsigned)
Cardiology Clinic Note   Patient Name: Charles Barnett Date of Encounter: 11/21/2021  Primary Care Provider:  Hayden Rasmussen, MD Primary Cardiologist:  Pixie Casino, MD  Patient Profile    Charles Barnett 56 year old male presents the clinic today for follow-up evaluation of his atrial flutter.  Past Medical History    Past Medical History:  Diagnosis Date   Arthritis    Atrial flutter ([place]) 12/2016   Hypertension    Neuromuscular disorder ([place])    Sleep apnea    Past Surgical History:  Procedure Laterality Date   CARDIOVERSION N/A 01/30/2017   Procedure: CARDIOVERSION;  Surgeon: Josue Hector, MD;  Location: [facility] ENDOSCOPY;  Service: Cardiovascular;  Laterality: N/A;   CARDIOVERSION N/A 06/27/2021   Procedure: CARDIOVERSION;  Surgeon: Pixie Casino, MD;  Location: [facility] ENDOSCOPY;  Service: Cardiovascular;  Laterality: N/A;   CARDIOVERSION N/A 07/25/2021   Procedure: CARDIOVERSION;  Surgeon: Skeet Latch, MD;  Location: [facility] ENDOSCOPY;  Service: Cardiovascular;  Laterality: N/A;   SPINE SURGERY      Allergies  Allergies  Allergen Reactions   Ibuprofen Other (See Comments)    Bloody stool   Iodine Swelling    Including contrast dye     History of Present Illness    Charles Barnett is a PMH of morbid obesity, OSA on CPAP, atrial flutter, arthritis, HTN, neuromuscular disorder, and chronic back pain (spinal stimulator, narcotic pain medication).  He was diagnosed with atrial flutter in 2018.  He had presented to urgent care with shortness of breath and fatigue.  He was placed on Xarelto and Cardizem.  His Cardizem was transitioned to Lopressor.  His echocardiogram 4/18 showed an EF of 55-60%, mild LVH, mild dilated right atrium, atrial septal aneurysm.  He underwent successful DCCV.  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  He reported compliance with CPAP.  He was seen by Almyra Deforest, PA-C on 06/04/2021.  During  that time he presented for 63-month follow-up.  His EKG showed atrial flutter with rate control.  He was cardiac unaware.  He was not sure when he had gone back into atrial flutter.  He reported chronic dyspnea with exertion and fatigue that were unchanged.  His back and knee pain prevented him from being very physically active.  Case was discussed with DOD and DCCV was recommended.  He reported that he had not missed doses of his Xarelto over the previous several months.  Follow-up was planned for 5 weeks after his DCCV.  He denied chest discomfort and was euvolemic on exam.  He underwent successful DCCV on 06/27/2021.  He received 3 shocks before converting to sinus rhythm.  He presented to the clinic 07/10/2021 for follow-up evaluation stated he noted a headache yesterday and chest discomfort with bending over.  He said the pain lasted for a few minutes and dissipated without intervention.  We reviewed his EKG today which shows atrial flutter 92 bpm and options for management.  I contacted his wife over the phone.  They did not wish to proceed with repeat DCCV at the time and wanted to be referred to the atrial fibrillation clinic.  He reported compliance with his Xarelto and denied bleeding issues.  He remained physically active at home and was cardiac unaware.  His blood pressure was elevated  at 142/86.  I will stopped his losartan and added valsartan.  We will ordered a BMP in 1 week, gave a salty 6 diet  sheet, referred to the atrial fibrillation clinic, and planned follow-up in 1 month.  l gave him a blood pressure log.  He was seen in follow-up in the atrial fibrillation clinic 08/01/2021.  His blood pressure was 138/76.  He remained in sinus rhythm.  He had been placed on Multaq and underwent repeat DCCV on 07/25/2021.  He had also lost about 3 pounds.  He denied bleeding issues with his Xarelto.  He denied shortness of breath and chest discomfort.   He presented to the clinic 08/15/21 for follow-up  evaluation and stated he had some dietary indiscretion over the holidays.  His weight was up 10 pounds.  He did not appear to be fluid volume overloaded.  He denied episodes of accelerated or irregular heartbeats.  He continued to be compliant with his Xarelto and denied bleeding issues.His blood pressure initially was 154/74 and on recheck it is 142/82.  I  increased his amlodipine to 7.5 mg daily, continued heart healthy low-sodium diet, continued increased physical activity, , and planned follow-up for 3 to 4 months.  He presents to the clinic today for follow-up evaluation and states***  Today he denies chest pain, shortness of breath, lower extremity edema, fatigue, palpitations, melena, hematuria, hemoptysis, diaphoresis, weakness, presyncope, syncope, orthopnea, and PND.   Home Medications    Prior to Admission medications   Medication Sig Start Date End Date Taking? Authorizing Provider  baclofen (LIORESAL) 10 MG tablet Take 10 mg by mouth 3 (three) times daily as needed for muscle spasms. 03/09/19   [provider]  Budeson-Glycopyrrol-Formoterol (BREZTRI AEROSPHERE) 160-9-4.8 MCG/ACT AERO Inhale 2 puffs by mouth 2 times daily 05/15/21     ergocalciferol (VITAMIN D2) 1.25 MG (50000 UT) capsule Take 1 capsule (1.25 mg) by mouth twice weekly 05/10/21     ipratropium-albuterol (DUONEB) 0.5-2.5 (3) MG/3ML SOLN use 3 ml via nebulizer every 6 hours as needed 02/02/21     losartan (COZAAR) 50 MG tablet Take 1 tablet (50 mg total) by mouth daily. 03/18/19   Hilty, Nadean Corwin, MD  metoprolol tartrate (LOPRESSOR) 50 MG tablet Take 1 tablet (50 mg) by mouth 2 times per day with food 06/04/21   Almyra Deforest, PA  naproxen sodium (ALEVE) 220 MG tablet Take 440 mg by mouth 2 (two) times daily as needed (pain).    [provider]  rivaroxaban (XARELTO) 20 MG TABS tablet TAKE 1 TABLET BY MOUTH DAILY WITH FOOD 06/04/21 06/04/22  Almyra Deforest, PA  spironolactone (ALDACTONE) 25 MG tablet Take 1 tablet by  mouth daily 06/04/21   Almyra Deforest, PA    Family History    Family History  Problem Relation Age of Onset   Diabetes Mother    Hyperlipidemia Mother    Hypertension Mother    Heart disease Mother    Diabetes Sister    Hypertension Sister    Heart disease Sister    Hyperparathyroidism Neg Hx    He indicated that his mother is alive. He indicated that his father is deceased. He indicated that his sister is alive. He indicated that his brother is deceased. He indicated that his maternal grandmother is deceased. He indicated that his maternal grandfather is deceased. He indicated that his paternal grandmother is deceased. He indicated that his paternal grandfather is deceased. He indicated that the status of his neg hx is unknown.   Social History    Social History   Socioeconomic History   Marital status: Married    Spouse name: Not on file  Number of children: 3   Years of education: Not on file   Highest education level: Not on file  Occupational History   Not on file  Tobacco Use   Smoking status: Never   Smokeless tobacco: Former    Types: Chew   Tobacco comments:    Former Chew Tobacco 07/18/2021  Vaping Use   Vaping Use: Never used  Substance and Sexual Activity   Alcohol use: No    Alcohol/week: 0.0 standard drinks   Drug use: No   Sexual activity: Yes    Partners: Female  Other Topics Concern   Not on file  Social History Narrative   Not on file   Social Determinants of Health   Financial Resource Strain: Not on file  Food Insecurity: Not on file  Transportation Needs: Not on file  Physical Activity: Not on file  Stress: Not on file  Social Connections: Not on file  Intimate Partner Violence: Not on file     Review of Systems    General:  No chills, fever, night sweats or weight changes.  Cardiovascular:  No chest pain, dyspnea on exertion, edema, orthopnea, palpitations, paroxysmal nocturnal dyspnea. Dermatological: No rash,  lesions/masses Respiratory: No cough, dyspnea Urologic: No hematuria, dysuria Abdominal:   No nausea, vomiting, diarrhea, bright red blood per rectum, melena, or hematemesis Neurologic:  No visual changes, wkns, changes in mental status. All other systems reviewed and are otherwise negative except as noted above.  Physical Exam    VS:  There were no vitals taken for this visit. , BMI There is no height or weight on file to calculate BMI. GEN: Well nourished, well developed, in no acute distress. HEENT: normal. Neck: Supple, no JVD, carotid bruits, or masses. Cardiac: RRR, no murmurs, rubs, or gallops. No clubbing, cyanosis, edema.  Radials/DP/PT 2+ and equal bilaterally.  Respiratory:  Respirations regular and unlabored, clear to auscultation bilaterally. GI: Soft, nontender, nondistended, BS + x 4. MS: no deformity or atrophy. Skin: warm and dry, no rash. Neuro:  Strength and sensation are intact. Psych: Normal affect.  Accessory Clinical Findings    Recent Labs: 07/18/2021: Platelets 260 07/25/2021: Hemoglobin 16.0 10/31/2021: ALT 18; BUN 5; Creatinine, Ser 0.87; Potassium 4.4; Sodium 139   Recent Lipid Panel    Component Value Date/Time   CHOL 176 08/08/2015 1348   TRIG 96 08/08/2015 1348   HDL 43 08/08/2015 1348   CHOLHDL 4.1 08/08/2015 1348   VLDL 19 08/08/2015 1348   LDLCALC 114 08/08/2015 1348    ECG personally reviewed by me today-atrial flutter with variable AV block with premature aberrantly conducted complexes left axis deviation, incomplete right bundle branch block, inferior infarct undetermined age 29 bpm- No acute changes  Echocardiogram 01/08/2017  Study Conclusions   - Left ventricle: The cavity size was normal. Wall thickness was    increased in a pattern of mild LVH. Systolic function was normal.    The estimated ejection fraction was in the range of 55% to 60%.    Indeterminant diastolic function (atrial fibrillation). Although    no diagnostic  regional wall motion abnormality was identified,    this possibility cannot be completely excluded on the basis of    this study.  - Ventricular septum: Mildly D-shaped interventricular septum,    suggestive of RV pressure/volume overload.  - Aortic valve: There was no stenosis.  - Mitral valve: There was no significant regurgitation.  - Right ventricle: The cavity size was mildly to moderately  dilated. Systolic function was normal.  - Right atrium: The atrium was mildly dilated.  - Atrial septum: Atrial septal aneurysm noted.  - Pulmonary arteries: No complete TR doppler jet so unable to    estimate PA systolic pressure.  - Systemic veins: IVC was not visualized.  Assessment & Plan   1.  Atrial flutter-heart rate today ***83 .  Continues to follow with A. fib clinic.  Underwent DCCV x3 on 06/27/2021 he was successfully converted to sinus rhythm after his third shock.  CHA2DS2-VASc score 1 for HTN.  He underwent repeat DCCV on 07/25/2021 after being started on Multaq.   Continue Xarelto, Multaq, metoprolol Heart healthy low-sodium diet-salty 6 given Increase physical activity as tolerated Avoid triggers caffeine, chocolate, EtOH, dehydration etc.  Obstructive sleep apnea-compliant with CPAP. Continue CPAP use Continue weight loss  Essential hypertension-BP today ***142/82.  Better control at home with increased amlodipine. Continue metoprolol, spironolactone, amlodipine Heart healthy low-sodium diet-salty 6 given Increase physical activity as tolerated  Morbid obesity-weight today ***466. Heart healthy low-sodium diet Increase physical activity as tolerated Continue weight loss  Disposition: Follow-up with Dr. Debara Pickett or APP in 4-6 months.    Jossie Ng. Aleda Madl NP-C    11/21/2021, 7:42 AM [address] [place] [address] Office [phone] Fax [phone]  Notice: This dictation was prepared with Dragon dictation along with smaller phrase  technology. Any transcriptional errors that result from this process are unintentional and may not be corrected upon review.  I spent 14*** minutes examining this patient, reviewing medications, and using patient centered shared decision making involving her cardiac care.  Prior to her visit I spent greater than 20 minutes reviewing her past medical history,  medications, and prior cardiac tests.

## 2021-11-26 ENCOUNTER — Ambulatory Visit: Payer: 59 | Admitting: General Practice

## 2021-12-04 ENCOUNTER — Other Ambulatory Visit (HOSPITAL_BASED_OUTPATIENT_CLINIC_OR_DEPARTMENT_OTHER): Payer: Self-pay

## 2021-12-20 ENCOUNTER — Other Ambulatory Visit (HOSPITAL_BASED_OUTPATIENT_CLINIC_OR_DEPARTMENT_OTHER): Payer: Self-pay

## 2021-12-21 ENCOUNTER — Other Ambulatory Visit (HOSPITAL_BASED_OUTPATIENT_CLINIC_OR_DEPARTMENT_OTHER): Payer: Self-pay

## 2022-01-10 DIAGNOSIS — M25561 Pain in right knee: Secondary | ICD-10-CM | POA: Diagnosis not present

## 2022-01-10 DIAGNOSIS — M25562 Pain in left knee: Secondary | ICD-10-CM | POA: Diagnosis not present

## 2022-01-10 DIAGNOSIS — M170 Bilateral primary osteoarthritis of knee: Secondary | ICD-10-CM | POA: Diagnosis not present

## 2022-01-17 ENCOUNTER — Other Ambulatory Visit: Payer: Self-pay | Admitting: General Practice

## 2022-01-17 ENCOUNTER — Other Ambulatory Visit (HOSPITAL_BASED_OUTPATIENT_CLINIC_OR_DEPARTMENT_OTHER): Payer: Self-pay

## 2022-01-18 ENCOUNTER — Other Ambulatory Visit (HOSPITAL_BASED_OUTPATIENT_CLINIC_OR_DEPARTMENT_OTHER): Payer: Self-pay

## 2022-01-18 MED ORDER — AMLODIPINE BESYLATE 5 MG PO TABS
7.5000 mg | ORAL_TABLET | Freq: Every day | ORAL | 3 refills | Status: DC
  Filled 2022-01-18: qty 45, 30d supply, fill #0
  Filled 2022-02-18: qty 45, 30d supply, fill #1
  Filled 2022-03-20: qty 45, 30d supply, fill #2
  Filled 2022-04-18: qty 45, 30d supply, fill #3

## 2022-02-18 ENCOUNTER — Other Ambulatory Visit (HOSPITAL_BASED_OUTPATIENT_CLINIC_OR_DEPARTMENT_OTHER): Payer: Self-pay

## 2022-03-01 ENCOUNTER — Other Ambulatory Visit (HOSPITAL_BASED_OUTPATIENT_CLINIC_OR_DEPARTMENT_OTHER): Payer: Self-pay

## 2022-03-20 ENCOUNTER — Other Ambulatory Visit (HOSPITAL_BASED_OUTPATIENT_CLINIC_OR_DEPARTMENT_OTHER): Payer: Self-pay

## 2022-03-28 DIAGNOSIS — G4733 Obstructive sleep apnea (adult) (pediatric): Secondary | ICD-10-CM | POA: Diagnosis not present

## 2022-03-28 DIAGNOSIS — Z6841 Body Mass Index (BMI) 40.0 and over, adult: Secondary | ICD-10-CM | POA: Diagnosis not present

## 2022-04-01 DIAGNOSIS — E559 Vitamin D deficiency, unspecified: Secondary | ICD-10-CM | POA: Diagnosis not present

## 2022-04-01 DIAGNOSIS — I4891 Unspecified atrial fibrillation: Secondary | ICD-10-CM | POA: Diagnosis not present

## 2022-04-01 DIAGNOSIS — G894 Chronic pain syndrome: Secondary | ICD-10-CM | POA: Diagnosis not present

## 2022-04-01 DIAGNOSIS — I10 Essential (primary) hypertension: Secondary | ICD-10-CM | POA: Diagnosis not present

## 2022-04-01 DIAGNOSIS — Z0001 Encounter for general adult medical examination with abnormal findings: Secondary | ICD-10-CM | POA: Diagnosis not present

## 2022-04-01 DIAGNOSIS — Z7901 Long term (current) use of anticoagulants: Secondary | ICD-10-CM | POA: Diagnosis not present

## 2022-04-06 DIAGNOSIS — G894 Chronic pain syndrome: Secondary | ICD-10-CM | POA: Diagnosis not present

## 2022-04-06 DIAGNOSIS — M5416 Radiculopathy, lumbar region: Secondary | ICD-10-CM | POA: Diagnosis not present

## 2022-04-12 ENCOUNTER — Other Ambulatory Visit (HOSPITAL_BASED_OUTPATIENT_CLINIC_OR_DEPARTMENT_OTHER): Payer: Self-pay

## 2022-04-18 ENCOUNTER — Other Ambulatory Visit (HOSPITAL_BASED_OUTPATIENT_CLINIC_OR_DEPARTMENT_OTHER): Payer: Self-pay

## 2022-04-19 ENCOUNTER — Other Ambulatory Visit (HOSPITAL_BASED_OUTPATIENT_CLINIC_OR_DEPARTMENT_OTHER): Payer: Self-pay

## 2022-04-19 MED ORDER — IPRATROPIUM-ALBUTEROL 0.5-2.5 (3) MG/3ML IN SOLN
RESPIRATORY_TRACT | 11 refills | Status: DC
  Filled 2022-04-19: qty 180, 15d supply, fill #0
  Filled 2022-05-23: qty 180, 15d supply, fill #1
  Filled 2022-06-03 – 2022-06-04 (×2): qty 180, 15d supply, fill #2
  Filled 2022-08-05: qty 180, 15d supply, fill #3
  Filled 2022-08-22: qty 180, 15d supply, fill #4
  Filled 2022-11-19: qty 180, 15d supply, fill #5

## 2022-04-19 MED ORDER — ALBUTEROL SULFATE HFA 108 (90 BASE) MCG/ACT IN AERS
INHALATION_SPRAY | RESPIRATORY_TRACT | 0 refills | Status: DC
  Filled 2022-04-19: qty 6.7, 30d supply, fill #0

## 2022-05-01 ENCOUNTER — Ambulatory Visit (HOSPITAL_COMMUNITY)
Admission: RE | Admit: 2022-05-01 | Discharge: 2022-05-01 | Disposition: A | Discharge status: Home / Self Care | Payer: 59 | Source: Ambulatory Visit | Attending: Physician Assistant | Admitting: Physician Assistant

## 2022-05-01 ENCOUNTER — Other Ambulatory Visit (HOSPITAL_COMMUNITY): Payer: Self-pay | Admitting: Physician Assistant

## 2022-05-01 ENCOUNTER — Encounter (HOSPITAL_COMMUNITY): Payer: Self-pay | Admitting: Physician Assistant

## 2022-05-01 VITALS — BP 148/90 | HR 68 | Ht 72.0 in | Wt >= 6400 oz

## 2022-05-01 DIAGNOSIS — I10 Essential (primary) hypertension: Secondary | ICD-10-CM | POA: Diagnosis not present

## 2022-05-01 DIAGNOSIS — I4819 Other persistent atrial fibrillation: Secondary | ICD-10-CM | POA: Insufficient documentation

## 2022-05-01 DIAGNOSIS — I4892 Unspecified atrial flutter: Secondary | ICD-10-CM | POA: Diagnosis not present

## 2022-05-01 DIAGNOSIS — Z6841 Body Mass Index (BMI) 40.0 and over, adult: Secondary | ICD-10-CM | POA: Insufficient documentation

## 2022-05-01 DIAGNOSIS — G4733 Obstructive sleep apnea (adult) (pediatric): Secondary | ICD-10-CM | POA: Diagnosis not present

## 2022-05-01 DIAGNOSIS — M549 Dorsalgia, unspecified: Secondary | ICD-10-CM | POA: Insufficient documentation

## 2022-05-01 DIAGNOSIS — G8929 Other chronic pain: Secondary | ICD-10-CM | POA: Diagnosis not present

## 2022-05-01 LAB — BASIC METABOLIC PANEL
Anion gap: 5 (ref 5–15)
BUN: 7 mg/dL (ref 6–20)
CO2: 28 mmol/L (ref 22–32)
Calcium: 10.2 mg/dL (ref 8.9–10.3)
Chloride: 106 mmol/L (ref 98–111)
Creatinine, Ser: 0.95 mg/dL (ref 0.61–1.24)
GFR, Estimated: >60 mL/min (ref >60)
Glucose, Bld: 105 mg/dL — ABNORMAL HIGH (ref 70–99)
Potassium: 4.5 mmol/L (ref 3.5–5.1)
Sodium: 139 mmol/L (ref 135–145)

## 2022-05-01 LAB — CBC
HCT: 45.1 % (ref 39.0–52.0)
Hemoglobin: 13.9 g/dL (ref 13.0–17.0)
MCH: 26.5 pg (ref 26.0–34.0)
MCHC: 30.8 g/dL (ref 30.0–36.0)
MCV: 85.9 fL (ref 80.0–100.0)
Platelets: 249 10*3/uL (ref 150–400)
RBC: 5.25 MIL/uL (ref 4.22–5.81)
RDW: 15.1 % (ref 11.5–15.5)
WBC: 10.1 10*3/uL (ref 4.0–10.5)
nRBC: 0 % (ref 0.0–0.2)

## 2022-05-01 NOTE — H&P (View-Only) (Signed)
Primary Care Physician: Dois Davenport, MD Primary Cardiologist: Dr Rennis Golden  Primary Electrophysiologist: none Referring Physician: Edd Fabian NP   Charles Barnett is a 56 y.o. male with a history of morbid obesity, OSA, atrial flutter, HTN, chronic back pain s/p spinal stimulator who presents for follow up in the [facility].  The patient was initially diagnosed with atrial flutter in 2018 after presenting to urgent care with symptoms of SOB and fatigue. He was started on Xarelto for a CHADS2VASC score of 1 and underwent DCCV on 01/30/17. He was found to be back in atrial flutter on follow up 06/04/21 and had another DCCV on 06/27/21. Unfortunately, he had quick return of his atrial flutter. He was started on Multaq and had repeat DCCV on 07/25/21.  On follow up today, patient reports that he has had more fatigue and chest tightness over the past several weeks. He has a [place] mobile which has shown persistent afib since July. There were no specific triggers that he could identify. He is compliant with his CPAP.  Today, he denies symptoms of palpitations, chest pain, shortness of breath, orthopnea, PND, lower extremity edema, dizziness, presyncope, syncope, bleeding, or neurologic sequela. The patient is tolerating medications without difficulties and is otherwise without complaint today.    Atrial Fibrillation Risk Factors:  he does have symptoms or diagnosis of sleep apnea. he is compliant with CPAP therapy. he does not have a history of rheumatic fever.   he has a BMI of Body mass index is 66.21 kg/m.Marland Kitchen Filed Weights   05/01/22 1126  Weight: (!) 221.4 kg    Family History  Problem Relation Age of Onset   Diabetes Mother    Hyperlipidemia Mother    Hypertension Mother    Heart disease Mother    Diabetes Sister    Hypertension Sister    Heart disease Sister    Hyperparathyroidism Neg Hx      Atrial Fibrillation Management history:  Previous  antiarrhythmic drugs: Multaq Previous cardioversions: 01/30/17, 06/27/21, 07/25/21 Previous ablations: none CHADS2VASC score: 1 Anticoagulation history: Xarelto    Past Medical History:  Diagnosis Date   Arthritis    Atrial flutter (HCC) 12/2016   Hypertension    Neuromuscular disorder (HCC)    Sleep apnea    Past Surgical History:  Procedure Laterality Date   CARDIOVERSION N/A 01/30/2017   Procedure: CARDIOVERSION;  Surgeon: Wendall Stade, MD;  Location: [facility] ENDOSCOPY;  Service: Cardiovascular;  Laterality: N/A;   CARDIOVERSION N/A 06/27/2021   Procedure: CARDIOVERSION;  Surgeon: Chrystie Nose, MD;  Location: [facility] ENDOSCOPY;  Service: Cardiovascular;  Laterality: N/A;   CARDIOVERSION N/A 07/25/2021   Procedure: CARDIOVERSION;  Surgeon: Chilton Si, MD;  Location: [facility] ENDOSCOPY;  Service: Cardiovascular;  Laterality: N/A;   SPINE SURGERY      Current Outpatient Medications  Medication Sig Dispense Refill   albuterol (VENTOLIN HFA) 108 (90 Base) MCG/ACT inhaler Inhale 1 puff by mouth every 4 hours as needed 6.7 g 0   amLODipine (NORVASC) 5 MG tablet Take 1 & 1/2 tablets (7.5 mg total) by mouth daily. 45 tablet 3   baclofen (LIORESAL) 10 MG tablet Take 10 mg by mouth 3 (three) times daily as needed for muscle spasms.     Budeson-Glycopyrrol-Formoterol (BREZTRI AEROSPHERE) 160-9-4.8 MCG/ACT AERO Inhale 2 puffs into the lungs 2 (two) times daily. 10.7 g 0   dronedarone (MULTAQ) 400 MG tablet Take 1 tablet (400 mg total) by mouth 2 (two) times  daily with a meal. 60 tablet 6   ergocalciferol (VITAMIN D2) 1.25 MG (50000 UT) capsule Take 1 capsule (1.25 mg) by mouth twice weekly 24 capsule 3   ipratropium-albuterol (DUONEB) 0.5-2.5 (3) MG/3ML SOLN use 3 ml via nebulizer every 6 hours as needed 90 mL 11   metoprolol tartrate (LOPRESSOR) 50 MG tablet Take 1 tablet (50 mg) by mouth 2 times per day with food 180 tablet 3   naproxen sodium (ALEVE) 220 MG tablet Take 440 mg by mouth 2 (two)  times daily as needed (pain).     rivaroxaban (XARELTO) 20 MG TABS tablet TAKE 1 TABLET BY MOUTH DAILY WITH FOOD 90 tablet 3   No current facility-administered medications for this encounter.    Allergies  Allergen Reactions   Ibuprofen Other (See Comments)    Bloody stool   Iodine Swelling    Including contrast dye     Social History   Socioeconomic History   Marital status: Married    Spouse name: Not on file   Number of children: 3   Years of education: Not on file   Highest education level: Not on file  Occupational History   Not on file  Tobacco Use   Smoking status: Never   Smokeless tobacco: Former    Types: Chew   Tobacco comments:    Former Chew Tobacco 07/18/2021  Vaping Use   Vaping Use: Never used  Substance and Sexual Activity   Alcohol use: No    Alcohol/week: 0.0 standard drinks of alcohol   Drug use: No   Sexual activity: Yes    Partners: Female  Other Topics Concern   Not on file  Social History Narrative   Not on file   Social Determinants of Health   Financial Resource Strain: Not on file  Food Insecurity: Not on file  Transportation Needs: Not on file  Physical Activity: Not on file  Stress: Not on file  Social Connections: Not on file  Intimate Partner Violence: Not on file     ROS- All systems are reviewed and negative except as per the HPI above.  Physical Exam: Vitals:   05/01/22 1126  BP: (!) 148/90  Pulse: 68  Weight: (!) 221.4 kg  Height: 6' (1.829 m)     GEN- The patient is a well appearing obese male, alert and oriented x 3 today.   HEENT-head normocephalic, atraumatic, sclera clear, conjunctiva pink, hearing intact, trachea midline. Lungs- Clear to ausculation bilaterally, normal work of breathing Heart- irregular rate and rhythm, no murmurs, rubs or gallops  GI- soft, NT, ND, + BS Extremities- no clubbing, cyanosis, or edema MS- no significant deformity or atrophy Skin- no rash or lesion Psych- euthymic mood,  full affect Neuro- strength and sensation are intact   Wt Readings from Last 3 Encounters:  05/01/22 (!) 221.4 kg  10/31/21 (!) 211.9 kg  08/15/21 (!) 211.6 kg    EKG today demonstrates  Afib Vent. rate 68 BPM PR interval * ms QRS duration 106 ms QT/QTcB 414/440 ms  Echo 01/08/17 demonstrated  - Left ventricle: The cavity size was normal. Wall thickness was    increased in a pattern of mild LVH. Systolic function was normal.    The estimated ejection fraction was in the range of 55% to 60%.    Indeterminant diastolic function (atrial fibrillation). Although    no diagnostic regional wall motion abnormality was identified,    this possibility cannot be completely excluded on the basis of      this study.  - Ventricular septum: Mildly D-shaped interventricular septum,    suggestive of RV pressure/volume overload.  - Aortic valve: There was no stenosis.  - Mitral valve: There was no significant regurgitation.  - Right ventricle: The cavity size was mildly to moderately    dilated. Systolic function was normal.  - Right atrium: The atrium was mildly dilated.  - Atrial septum: Atrial septal aneurysm noted.  - Pulmonary arteries: No complete TR doppler jet so unable to    estimate PA systolic pressure.  - Systemic veins: IVC was not visualized.   Impressions:   - Technically difficult study with poor acoustic windows. Normal LV    size with mild LV hypertrophy. EF 55-60%. Mildly D-shaped    interventricular septum is suggestive of a degree of RV    pressure/volume overload. Mild to moderate RV dilation with    normal systolic function. Unable to estimate PA systolic pressure    on this study.   Epic records are reviewed at length today  CHA2DS2-VASc Score = 1  The patient's score is based upon: CHF History: 0 HTN History: 1 Diabetes History: 0 Stroke History: 0 Vascular Disease History: 0 Age Score: 0 Gender Score: 0       ASSESSMENT AND PLAN: 1. Persistent atrial  fibrillation/Atrial flutter The patient's CHA2DS2-VASc score is 1, indicating a 0.6% annual risk of stroke.   Patient in persistent atrial fibrillation.  Would avoid class IC with LAFB and inc RBBB on ECG.  He has done suprisingly well with Multaq. Will arrange for DCCV. Check bmet/cbc. Continue Multaq 400 mg BID Dofetilide would be the next option if he has quick return of afib. Continue Xarelto 20 mg daily Continue Lopressor 50 mg BID Kardia mobile for home monitoring.   2. Morbid Obesity Body mass index is 66.21 kg/m. Lifestyle modification was discussed and encouraged including regular physical activity and weight reduction. Suspect this is significantly contributing the persistence of his arrhythmias.  Patient declined referral to [facility].   3. Obstructive sleep apnea Encouraged compliance with CPAP therapy.  4. HTN Stable, no changes today.   Follow up in the AF clinic post DCCV and with Dr Rennis Golden or APP in 3 months.   Jorja Loa PA-C [facility] [address] [place], [place] [address] [phone] 05/01/2022 11:32 AM

## 2022-05-01 NOTE — Patient Instructions (Signed)
Cardioversion scheduled for Thursday August 24  - Arrive at the [facility] and go to admitting at 8:30am  - Do not eat or drink anything after midnight the night prior to your procedure.  - Take all your morning medication (except diabetic medications) with a sip of water prior to arrival.  - You will not be able to drive home after your procedure.  - Do NOT miss any doses of your blood thinner - if you should miss a dose please notify our office immediately.  - If you feel as if you go back into normal rhythm prior to scheduled cardioversion, please notify our office immediately. If your procedure is canceled in the cardioversion suite you will be charged a cancellation fee.

## 2022-05-01 NOTE — Progress Notes (Signed)
Primary Care Physician: Dois Davenport, MD Primary Cardiologist: Dr Rennis Golden  Primary Electrophysiologist: none Referring Physician: Edd Fabian NP   Charles Barnett is a 56 y.o. male with a history of morbid obesity, OSA, atrial flutter, HTN, chronic back pain s/p spinal stimulator who presents for follow up in the [facility].  The patient was initially diagnosed with atrial flutter in 2018 after presenting to urgent care with symptoms of SOB and fatigue. He was started on Xarelto for a CHADS2VASC score of 1 and underwent DCCV on 01/30/17. He was found to be back in atrial flutter on follow up 06/04/21 and had another DCCV on 06/27/21. Unfortunately, he had quick return of his atrial flutter. He was started on Multaq and had repeat DCCV on 07/25/21.  On follow up today, patient reports that he has had more fatigue and chest tightness over the past several weeks. He has a [place] mobile which has shown persistent afib since July. There were no specific triggers that he could identify. He is compliant with his CPAP.  Today, he denies symptoms of palpitations, chest pain, shortness of breath, orthopnea, PND, lower extremity edema, dizziness, presyncope, syncope, bleeding, or neurologic sequela. The patient is tolerating medications without difficulties and is otherwise without complaint today.    Atrial Fibrillation Risk Factors:  he does have symptoms or diagnosis of sleep apnea. he is compliant with CPAP therapy. he does not have a history of rheumatic fever.   he has a BMI of Body mass index is 66.21 kg/m.Marland Kitchen Filed Weights   05/01/22 1126  Weight: (!) 221.4 kg    Family History  Problem Relation Age of Onset   Diabetes Mother    Hyperlipidemia Mother    Hypertension Mother    Heart disease Mother    Diabetes Sister    Hypertension Sister    Heart disease Sister    Hyperparathyroidism Neg Hx      Atrial Fibrillation Management history:  Previous  antiarrhythmic drugs: Multaq Previous cardioversions: 01/30/17, 06/27/21, 07/25/21 Previous ablations: none CHADS2VASC score: 1 Anticoagulation history: Xarelto    Past Medical History:  Diagnosis Date   Arthritis    Atrial flutter (HCC) 12/2016   Hypertension    Neuromuscular disorder (HCC)    Sleep apnea    Past Surgical History:  Procedure Laterality Date   CARDIOVERSION N/A 01/30/2017   Procedure: CARDIOVERSION;  Surgeon: Wendall Stade, MD;  Location: [facility] ENDOSCOPY;  Service: Cardiovascular;  Laterality: N/A;   CARDIOVERSION N/A 06/27/2021   Procedure: CARDIOVERSION;  Surgeon: Chrystie Nose, MD;  Location: [facility] ENDOSCOPY;  Service: Cardiovascular;  Laterality: N/A;   CARDIOVERSION N/A 07/25/2021   Procedure: CARDIOVERSION;  Surgeon: Chilton Si, MD;  Location: [facility] ENDOSCOPY;  Service: Cardiovascular;  Laterality: N/A;   SPINE SURGERY      Current Outpatient Medications  Medication Sig Dispense Refill   albuterol (VENTOLIN HFA) 108 (90 Base) MCG/ACT inhaler Inhale 1 puff by mouth every 4 hours as needed 6.7 g 0   amLODipine (NORVASC) 5 MG tablet Take 1 & 1/2 tablets (7.5 mg total) by mouth daily. 45 tablet 3   baclofen (LIORESAL) 10 MG tablet Take 10 mg by mouth 3 (three) times daily as needed for muscle spasms.     Budeson-Glycopyrrol-Formoterol (BREZTRI AEROSPHERE) 160-9-4.8 MCG/ACT AERO Inhale 2 puffs into the lungs 2 (two) times daily. 10.7 g 0   dronedarone (MULTAQ) 400 MG tablet Take 1 tablet (400 mg total) by mouth 2 (two) times  daily with a meal. 60 tablet 6   ergocalciferol (VITAMIN D2) 1.25 MG (50000 UT) capsule Take 1 capsule (1.25 mg) by mouth twice weekly 24 capsule 3   ipratropium-albuterol (DUONEB) 0.5-2.5 (3) MG/3ML SOLN use 3 ml via nebulizer every 6 hours as needed 90 mL 11   metoprolol tartrate (LOPRESSOR) 50 MG tablet Take 1 tablet (50 mg) by mouth 2 times per day with food 180 tablet 3   naproxen sodium (ALEVE) 220 MG tablet Take 440 mg by mouth 2 (two)  times daily as needed (pain).     rivaroxaban (XARELTO) 20 MG TABS tablet TAKE 1 TABLET BY MOUTH DAILY WITH FOOD 90 tablet 3   No current facility-administered medications for this encounter.    Allergies  Allergen Reactions   Ibuprofen Other (See Comments)    Bloody stool   Iodine Swelling    Including contrast dye     Social History   Socioeconomic History   Marital status: Married    Spouse name: Not on file   Number of children: 3   Years of education: Not on file   Highest education level: Not on file  Occupational History   Not on file  Tobacco Use   Smoking status: Never   Smokeless tobacco: Former    Types: Chew   Tobacco comments:    Former Chew Tobacco 07/18/2021  Vaping Use   Vaping Use: Never used  Substance and Sexual Activity   Alcohol use: No    Alcohol/week: 0.0 standard drinks of alcohol   Drug use: No   Sexual activity: Yes    Partners: Female  Other Topics Concern   Not on file  Social History Narrative   Not on file   Social Determinants of Health   Financial Resource Strain: Not on file  Food Insecurity: Not on file  Transportation Needs: Not on file  Physical Activity: Not on file  Stress: Not on file  Social Connections: Not on file  Intimate Partner Violence: Not on file     ROS- All systems are reviewed and negative except as per the HPI above.  Physical Exam: Vitals:   05/01/22 1126  BP: (!) 148/90  Pulse: 68  Weight: (!) 221.4 kg  Height: 6' (1.829 m)     GEN- The patient is a well appearing obese male, alert and oriented x 3 today.   HEENT-head normocephalic, atraumatic, sclera clear, conjunctiva pink, hearing intact, trachea midline. Lungs- Clear to ausculation bilaterally, normal work of breathing Heart- irregular rate and rhythm, no murmurs, rubs or gallops  GI- soft, NT, ND, + BS Extremities- no clubbing, cyanosis, or edema MS- no significant deformity or atrophy Skin- no rash or lesion Psych- euthymic mood,  full affect Neuro- strength and sensation are intact   Wt Readings from Last 3 Encounters:  05/01/22 (!) 221.4 kg  10/31/21 (!) 211.9 kg  08/15/21 (!) 211.6 kg    EKG today demonstrates  Afib Vent. rate 68 BPM PR interval * ms QRS duration 106 ms QT/QTcB 414/440 ms  Echo 01/08/17 demonstrated  - Left ventricle: The cavity size was normal. Wall thickness was    increased in a pattern of mild LVH. Systolic function was normal.    The estimated ejection fraction was in the range of 55% to 60%.    Indeterminant diastolic function (atrial fibrillation). Although    no diagnostic regional wall motion abnormality was identified,    this possibility cannot be completely excluded on the basis of  this study.  - Ventricular septum: Mildly D-shaped interventricular septum,    suggestive of RV pressure/volume overload.  - Aortic valve: There was no stenosis.  - Mitral valve: There was no significant regurgitation.  - Right ventricle: The cavity size was mildly to moderately    dilated. Systolic function was normal.  - Right atrium: The atrium was mildly dilated.  - Atrial septum: Atrial septal aneurysm noted.  - Pulmonary arteries: No complete TR doppler jet so unable to    estimate PA systolic pressure.  - Systemic veins: IVC was not visualized.   Impressions:   - Technically difficult study with poor acoustic windows. Normal LV    size with mild LV hypertrophy. EF 55-60%. Mildly D-shaped    interventricular septum is suggestive of a degree of RV    pressure/volume overload. Mild to moderate RV dilation with    normal systolic function. Unable to estimate PA systolic pressure    on this study.   Epic records are reviewed at length today  CHA2DS2-VASc Score = 1  The patient's score is based upon: CHF History: 0 HTN History: 1 Diabetes History: 0 Stroke History: 0 Vascular Disease History: 0 Age Score: 0 Gender Score: 0       ASSESSMENT AND PLAN: 1. Persistent atrial  fibrillation/Atrial flutter The patient's CHA2DS2-VASc score is 1, indicating a 0.6% annual risk of stroke.   Patient in persistent atrial fibrillation.  Would avoid class IC with LAFB and inc RBBB on ECG.  He has done suprisingly well with Multaq. Will arrange for DCCV. Check bmet/cbc. Continue Multaq 400 mg BID Dofetilide would be the next option if he has quick return of afib. Continue Xarelto 20 mg daily Continue Lopressor 50 mg BID Kardia mobile for home monitoring.   2. Morbid Obesity Body mass index is 66.21 kg/m. Lifestyle modification was discussed and encouraged including regular physical activity and weight reduction. Suspect this is significantly contributing the persistence of his arrhythmias.  Patient declined referral to [facility].   3. Obstructive sleep apnea Encouraged compliance with CPAP therapy.  4. HTN Stable, no changes today.   Follow up in the AF clinic post DCCV and with Dr Rennis Golden or APP in 3 months.   Jorja Loa PA-C [facility] [address] [place], [place] [address] [phone] 05/01/2022 11:32 AM

## 2022-05-03 ENCOUNTER — Encounter (HOSPITAL_COMMUNITY): Payer: Self-pay | Admitting: Internal Medicine

## 2022-05-09 ENCOUNTER — Ambulatory Visit (HOSPITAL_COMMUNITY): Payer: 59 | Admitting: Anesthesiology

## 2022-05-09 ENCOUNTER — Encounter (HOSPITAL_COMMUNITY)
Admission: RE | Disposition: A | Discharge status: Home / Self Care | Payer: Self-pay | Source: Home / Self Care | Attending: Internal Medicine

## 2022-05-09 ENCOUNTER — Other Ambulatory Visit: Payer: Self-pay

## 2022-05-09 ENCOUNTER — Ambulatory Visit (HOSPITAL_BASED_OUTPATIENT_CLINIC_OR_DEPARTMENT_OTHER): Payer: 59 | Admitting: Anesthesiology

## 2022-05-09 ENCOUNTER — Ambulatory Visit (HOSPITAL_COMMUNITY)
Admission: RE | Admit: 2022-05-09 | Discharge: 2022-05-09 | Disposition: A | Discharge status: Home / Self Care | Payer: 59 | Attending: Internal Medicine | Admitting: Internal Medicine

## 2022-05-09 ENCOUNTER — Encounter (HOSPITAL_COMMUNITY): Payer: Self-pay | Admitting: Internal Medicine

## 2022-05-09 DIAGNOSIS — G8929 Other chronic pain: Secondary | ICD-10-CM | POA: Diagnosis not present

## 2022-05-09 DIAGNOSIS — I4891 Unspecified atrial fibrillation: Secondary | ICD-10-CM | POA: Diagnosis not present

## 2022-05-09 DIAGNOSIS — Z87891 Personal history of nicotine dependence: Secondary | ICD-10-CM | POA: Insufficient documentation

## 2022-05-09 DIAGNOSIS — I4892 Unspecified atrial flutter: Secondary | ICD-10-CM | POA: Diagnosis not present

## 2022-05-09 DIAGNOSIS — Z79899 Other long term (current) drug therapy: Secondary | ICD-10-CM | POA: Diagnosis not present

## 2022-05-09 DIAGNOSIS — Z9682 Presence of neurostimulator: Secondary | ICD-10-CM | POA: Insufficient documentation

## 2022-05-09 DIAGNOSIS — Z9989 Dependence on other enabling machines and devices: Secondary | ICD-10-CM | POA: Diagnosis not present

## 2022-05-09 DIAGNOSIS — Z6841 Body Mass Index (BMI) 40.0 and over, adult: Secondary | ICD-10-CM | POA: Diagnosis not present

## 2022-05-09 DIAGNOSIS — Z7901 Long term (current) use of anticoagulants: Secondary | ICD-10-CM | POA: Insufficient documentation

## 2022-05-09 DIAGNOSIS — I10 Essential (primary) hypertension: Secondary | ICD-10-CM | POA: Diagnosis not present

## 2022-05-09 DIAGNOSIS — I4819 Other persistent atrial fibrillation: Secondary | ICD-10-CM | POA: Insufficient documentation

## 2022-05-09 DIAGNOSIS — G4733 Obstructive sleep apnea (adult) (pediatric): Secondary | ICD-10-CM | POA: Diagnosis not present

## 2022-05-09 HISTORY — PX: CARDIOVERSION: SHX1299

## 2022-05-09 SURGERY — CARDIOVERSION
Anesthesia: General

## 2022-05-09 MED ORDER — PROPOFOL 10 MG/ML IV BOLUS
INTRAVENOUS | Status: DC | PRN
  Administered 2022-05-09: 50 mg via INTRAVENOUS
  Administered 2022-05-09: 100 mg via INTRAVENOUS
  Administered 2022-05-09: 50 mg via INTRAVENOUS

## 2022-05-09 MED ORDER — LIDOCAINE 2% (20 MG/ML) 5 ML SYRINGE
INTRAMUSCULAR | Status: DC | PRN
  Administered 2022-05-09: 100 mg via INTRAVENOUS

## 2022-05-09 MED ORDER — SODIUM CHLORIDE 0.9 % IV SOLN
INTRAVENOUS | Status: AC | PRN
  Administered 2022-05-09: 500 mL via INTRAVENOUS

## 2022-05-09 NOTE — CV Procedure (Signed)
CARDIOVERSION  Indication:  Atrial fibrillation  Patient anesthetized by anesthesia with 200 mg propofol and 100 mg lidocaine intravenously  With pads in apex/base positions, patient cardioverted to SR with 200 J synchronized biphasic energy.  Procedure was without complication  12 lead EKG pending    Dietrich Pates MD

## 2022-05-09 NOTE — Interval H&P Note (Signed)
History and Physical Interval Note:  05/09/2022 8:47 AM  Charles Barnett  has presented today for surgery, with the diagnosis of AFIB.  The various methods of treatment have been discussed with the patient and family. After consideration of risks, benefits and other options for treatment, the patient has consented to  Procedure(s): CARDIOVERSION (N/A) as a surgical intervention.  The patient's history has been reviewed, patient examined, no change in status, stable for surgery.  I have reviewed the patient's chart and labs.  Questions were answered to the patient's satisfaction.     Dietrich Pates

## 2022-05-09 NOTE — Anesthesia Postprocedure Evaluation (Signed)
Anesthesia Post Note  Patient: Charles Barnett  Procedure(s) Performed: CARDIOVERSION     Patient location during evaluation: PACU Anesthesia Type: General Level of consciousness: awake and alert Pain management: pain level controlled Vital Signs Assessment: post-procedure vital signs reviewed and stable Respiratory status: spontaneous breathing, nonlabored ventilation and respiratory function stable Cardiovascular status: blood pressure returned to baseline Postop Assessment: no apparent nausea or vomiting Anesthetic complications: no   No notable events documented.  Last Vitals:  Vitals:   05/09/22 0915 05/09/22 0920  BP: (!) 109/59 (!) 104/58  Pulse: 68 68  Resp: (!) 26 20  Temp: 36.5 C   SpO2: 95% 94%    Last Pain:  Vitals:   05/09/22 0915  TempSrc: Temporal  PainSc: 0-No pain                 Shanda Howells

## 2022-05-09 NOTE — Transfer of Care (Signed)
Immediate Anesthesia Transfer of Care Note  Patient: Charles Barnett  Procedure(s) Performed: CARDIOVERSION  Patient Location: Endoscopy Unit  Anesthesia Type:General  Level of Consciousness: awake, oriented and patient cooperative  Airway & Oxygen Therapy: Patient Spontanous Breathing and Patient connected to nasal cannula oxygen  Post-op Assessment: Report given to RN and Post -op Vital signs reviewed and stable  Post vital signs: Reviewed  Last Vitals:  Vitals Value Taken Time  BP    Temp    Pulse 69 05/09/22 0915  Resp 27 05/09/22 0915  SpO2 94 % 05/09/22 0915  Vitals shown include unvalidated device data.  Last Pain:  Vitals:   05/09/22 0831  TempSrc: Temporal  PainSc: 9          Complications: No notable events documented.

## 2022-05-09 NOTE — Anesthesia Preprocedure Evaluation (Addendum)
Anesthesia Evaluation  Patient identified by MRN, date of birth, ID band Patient awake    Reviewed: Allergy & Precautions, NPO status , Patient's Chart, lab work & pertinent test results, reviewed documented beta blocker date and time   History of Anesthesia Complications Negative for: history of anesthetic complications  Airway Mallampati: IV  TM Distance: >3 FB Neck ROM: Full    Dental  (+) Poor Dentition, Dental Advisory Given, Partial Upper, Partial Lower   Pulmonary sleep apnea and Continuous Positive Airway Pressure Ventilation ,    Pulmonary exam normal        Cardiovascular hypertension, Pt. on medications and Pt. on home beta blockers Normal cardiovascular exam+ dysrhythmias Atrial Fibrillation   TTE 2018: mild LVH, EF 55-60%, mildly D-shaped interventricular septum suggestive of RV pressure/volume overload, mild to moderate RVE, mild RAE     Neuro/Psych negative neurological ROS  negative psych ROS   GI/Hepatic negative GI ROS, Neg liver ROS,   Endo/Other  Morbid obesity  Renal/GU negative Renal ROS  negative genitourinary   Musculoskeletal  (+) Arthritis ,   Abdominal   Peds  Hematology negative hematology ROS (+)   Anesthesia Other Findings Day of surgery medications reviewed with patient.  Reproductive/Obstetrics negative OB ROS                            Anesthesia Physical  Anesthesia Plan  ASA: 4  Anesthesia Plan: General   Post-op Pain Management:    Induction: Intravenous  PONV Risk Score and Plan: Treatment may vary due to age or medical condition and Propofol infusion  Airway Management Planned: Mask  Additional Equipment: None  Intra-op Plan:   Post-operative Plan:   Informed Consent: I have reviewed the patients History and Physical, chart, labs and discussed the procedure including the risks, benefits and alternatives for the proposed anesthesia with  the patient or authorized representative who has indicated his/her understanding and acceptance.       Plan Discussed with: CRNA  Anesthesia Plan Comments:         Anesthesia Quick Evaluation

## 2022-05-21 ENCOUNTER — Ambulatory Visit (HOSPITAL_COMMUNITY)
Admission: RE | Admit: 2022-05-21 | Discharge: 2022-05-21 | Disposition: A | Discharge status: Home / Self Care | Payer: 59 | Source: Ambulatory Visit | Attending: Physician Assistant | Admitting: Physician Assistant

## 2022-05-21 ENCOUNTER — Encounter (HOSPITAL_COMMUNITY): Payer: Self-pay | Admitting: Physician Assistant

## 2022-05-21 VITALS — BP 142/84 | HR 58 | Wt >= 6400 oz

## 2022-05-21 DIAGNOSIS — I4892 Unspecified atrial flutter: Secondary | ICD-10-CM | POA: Diagnosis not present

## 2022-05-21 DIAGNOSIS — M5489 Other dorsalgia: Secondary | ICD-10-CM | POA: Diagnosis not present

## 2022-05-21 DIAGNOSIS — I4819 Other persistent atrial fibrillation: Secondary | ICD-10-CM | POA: Insufficient documentation

## 2022-05-21 DIAGNOSIS — Z6841 Body Mass Index (BMI) 40.0 and over, adult: Secondary | ICD-10-CM | POA: Diagnosis not present

## 2022-05-21 DIAGNOSIS — G8929 Other chronic pain: Secondary | ICD-10-CM | POA: Insufficient documentation

## 2022-05-21 DIAGNOSIS — G4733 Obstructive sleep apnea (adult) (pediatric): Secondary | ICD-10-CM | POA: Insufficient documentation

## 2022-05-21 DIAGNOSIS — I10 Essential (primary) hypertension: Secondary | ICD-10-CM | POA: Diagnosis not present

## 2022-05-21 NOTE — Progress Notes (Signed)
Primary Care Physician: Dois Davenport, MD Primary Cardiologist: Dr Rennis Golden  Primary Electrophysiologist: none Referring Physician: Edd Fabian NP   Charles Barnett is a 57 y.o. male with a history of morbid obesity, OSA, atrial flutter, HTN, chronic back pain s/p spinal stimulator who presents for follow up in the [facility].  The patient was initially diagnosed with atrial flutter in 2018 after presenting to urgent care with symptoms of SOB and fatigue. He was started on Xarelto for a CHADS2VASC score of 1 and underwent DCCV on 01/30/17. He was found to be back in atrial flutter on follow up 06/04/21 and had another DCCV on 06/27/21. Unfortunately, he had quick return of his atrial flutter. He was started on Multaq and had repeat DCCV on 07/25/21.  On follow up today, patient is s/p DCCV on 05/09/22. He is in SR today and feels improved with less SOB on exertion. No bleeding issues on anticoagulation.   Today, he denies symptoms of palpitations, chest pain, shortness of breath, orthopnea, PND, lower extremity edema, dizziness, presyncope, syncope, bleeding, or neurologic sequela. The patient is tolerating medications without difficulties and is otherwise without complaint today.    Atrial Fibrillation Risk Factors:  he does have symptoms or diagnosis of sleep apnea. he is compliant with CPAP therapy. he does not have a history of rheumatic fever.   he has a BMI of Body mass index is 64.86 kg/m.Marland Kitchen Filed Weights   05/21/22 1422  Weight: (!) 216.9 kg    Family History  Problem Relation Age of Onset   Diabetes Mother    Hyperlipidemia Mother    Hypertension Mother    Heart disease Mother    Diabetes Sister    Hypertension Sister    Heart disease Sister    Hyperparathyroidism Neg Hx      Atrial Fibrillation Management history:  Previous antiarrhythmic drugs: Multaq Previous cardioversions: 01/30/17, 06/27/21, 07/25/21, 05/09/22 Previous ablations:  none CHADS2VASC score: 1 Anticoagulation history: Xarelto    Past Medical History:  Diagnosis Date   Arthritis    Atrial flutter (HCC) 12/2016   Hypertension    Neuromuscular disorder (HCC)    Sleep apnea    Past Surgical History:  Procedure Laterality Date   CARDIOVERSION N/A 01/30/2017   Procedure: CARDIOVERSION;  Surgeon: Wendall Stade, MD;  Location: [facility] ENDOSCOPY;  Service: Cardiovascular;  Laterality: N/A;   CARDIOVERSION N/A 06/27/2021   Procedure: CARDIOVERSION;  Surgeon: Chrystie Nose, MD;  Location: [facility] ENDOSCOPY;  Service: Cardiovascular;  Laterality: N/A;   CARDIOVERSION N/A 07/25/2021   Procedure: CARDIOVERSION;  Surgeon: Chilton Si, MD;  Location[facility] ENDOSCOPY;  Service: Cardiovascular;  Laterality: N/A;   CARDIOVERSION N/A 05/09/2022   Procedure: CARDIOVERSION;  Surgeon: Pricilla Riffle, MD;  Location: [facility] ENDOSCOPY;  Service: Cardiovascular;  Laterality: N/A;   SPINE SURGERY      Current Outpatient Medications  Medication Sig Dispense Refill   albuterol (VENTOLIN HFA) 108 (90 Base) MCG/ACT inhaler Inhale 1 puff by mouth every 4 hours as needed 6.7 g 0   amLODipine (NORVASC) 5 MG tablet Take 1 & 1/2 tablets (7.5 mg total) by mouth daily. (Patient taking differently: Take 7.5 mg by mouth daily after lunch.) 45 tablet 3   baclofen (LIORESAL) 10 MG tablet Take 10 mg by mouth 3 (three) times daily as needed for muscle spasms.     Budeson-Glycopyrrol-Formoterol (BREZTRI AEROSPHERE) 160-9-4.8 MCG/ACT AERO Inhale 2 puffs into the lungs 2 (two) times daily. (Patient taking  differently: Inhale 2 puffs into the lungs 2 (two) times daily as needed (respiratory issues.).) 10.7 g 0   dronedarone (MULTAQ) 400 MG tablet Take 1 tablet (400 mg total) by mouth 2 (two) times daily with a meal. 60 tablet 6   ergocalciferol (VITAMIN D2) 1.25 MG (50000 UT) capsule Take 1 capsule (1.25 mg) by mouth twice weekly (Patient taking differently: Take 50,000 Units by mouth 2 (two) times a week.  Wednesday & Saturday) 24 capsule 3   ipratropium-albuterol (DUONEB) 0.5-2.5 (3) MG/3ML SOLN use 3 ml via nebulizer every 6 hours as needed 90 mL 11   metoprolol tartrate (LOPRESSOR) 50 MG tablet Take 1 tablet (50 mg) by mouth 2 times per day with food 180 tablet 3   naproxen sodium (ALEVE) 220 MG tablet Take 440 mg by mouth 2 (two) times daily as needed (pain).     rivaroxaban (XARELTO) 20 MG TABS tablet TAKE 1 TABLET BY MOUTH DAILY WITH FOOD (Patient taking differently: Take 20 mg by mouth daily with supper.) 90 tablet 3   No current facility-administered medications for this encounter.    Allergies  Allergen Reactions   Ibuprofen Other (See Comments)    Bloody stool   Iodine Swelling    Including contrast dye     Social History   Socioeconomic History   Marital status: Married    Spouse name: Not on file   Number of children: 3   Years of education: Not on file   Highest education level: Not on file  Occupational History   Not on file  Tobacco Use   Smoking status: Never   Smokeless tobacco: Former    Types: Chew   Tobacco comments:    Former Chew Tobacco 07/18/2021  Vaping Use   Vaping Use: Never used  Substance and Sexual Activity   Alcohol use: No    Alcohol/week: 0.0 standard drinks of alcohol   Drug use: No   Sexual activity: Yes    Partners: Female  Other Topics Concern   Not on file  Social History Narrative   Not on file   Social Determinants of Health   Financial Resource Strain: Not on file  Food Insecurity: Not on file  Transportation Needs: Not on file  Physical Activity: Not on file  Stress: Not on file  Social Connections: Not on file  Intimate Partner Violence: Not on file     ROS- All systems are reviewed and negative except as per the HPI above.  Physical Exam: Vitals:   05/21/22 1422  BP: (!) 142/84  Pulse: (!) 58  Weight: (!) 216.9 kg    GEN- The patient is a well appearing obese male, alert and oriented x 3 today.   HEENT-head  normocephalic, atraumatic, sclera clear, conjunctiva pink, hearing intact, trachea midline. Lungs- Clear to ausculation bilaterally, normal work of breathing Heart- Regular rate and rhythm, no murmurs, rubs or gallops  GI- soft, NT, ND, + BS Extremities- no clubbing, cyanosis, or edema MS- no significant deformity or atrophy Skin- no rash or lesion Psych- euthymic mood, full affect Neuro- strength and sensation are intact   Wt Readings from Last 3 Encounters:  05/21/22 (!) 216.9 kg  05/01/22 (!) 221.4 kg  10/31/21 (!) 211.9 kg    EKG today demonstrates  SB, 1st degree AV block, LAFB Vent. rate 58 BPM PR interval 226 ms QRS duration 102 ms QT/QTcB 440/431 ms  Echo 01/08/17 demonstrated  - Left ventricle: The cavity size was normal. Wall thickness was  increased in a pattern of mild LVH. Systolic function was normal.    The estimated ejection fraction was in the range of 55% to 60%.    Indeterminant diastolic function (atrial fibrillation). Although    no diagnostic regional wall motion abnormality was identified,    this possibility cannot be completely excluded on the basis of    this study.  - Ventricular septum: Mildly D-shaped interventricular septum,    suggestive of RV pressure/volume overload.  - Aortic valve: There was no stenosis.  - Mitral valve: There was no significant regurgitation.  - Right ventricle: The cavity size was mildly to moderately    dilated. Systolic function was normal.  - Right atrium: The atrium was mildly dilated.  - Atrial septum: Atrial septal aneurysm noted.  - Pulmonary arteries: No complete TR doppler jet so unable to    estimate PA systolic pressure.  - Systemic veins: IVC was not visualized.   Impressions:   - Technically difficult study with poor acoustic windows. Normal LV    size with mild LV hypertrophy. EF 55-60%. Mildly D-shaped    interventricular septum is suggestive of a degree of RV    pressure/volume overload. Mild to  moderate RV dilation with    normal systolic function. Unable to estimate PA systolic pressure    on this study.   Epic records are reviewed at length today  CHA2DS2-VASc Score = 1  The patient's score is based upon: CHF History: 0 HTN History: 1 Diabetes History: 0 Stroke History: 0 Vascular Disease History: 0 Age Score: 0 Gender Score: 0       ASSESSMENT AND PLAN: 1. Persistent atrial fibrillation/Atrial flutter The patient's CHA2DS2-VASc score is 1, indicating a 0.6% annual risk of stroke.   Would avoid class IC with LAFB and inc RBBB on ECG.  S/p DCCV 05/09/22 Patient back in SR. Continue Multaq 400 mg BID Dofetilide would be the next option if he has quick return of afib. Continue Xarelto 20 mg daily Continue Lopressor 50 mg BID Kardia mobile for home monitoring.   2. Morbid Obesity Body mass index is 64.86 kg/m. Lifestyle modification was discussed and encouraged including regular physical activity and weight reduction. Suspect this is significantly contributing the persistence of his arrhythmias.  Patient declined referral to [facility].   3. Obstructive sleep apnea Encouraged compliance with CPAP therapy.  4. HTN Stable, no changes today.   Follow up in the AF clinic in 6 months and with Dr Rennis Golden or APP in 3 months.   Jorja Loa PA-C Af[facility] Clinic [facility] [address] [place], [place] [address] [phone] 05/21/2022 2:52 PM

## 2022-05-23 ENCOUNTER — Other Ambulatory Visit (HOSPITAL_BASED_OUTPATIENT_CLINIC_OR_DEPARTMENT_OTHER): Payer: Self-pay

## 2022-05-23 ENCOUNTER — Other Ambulatory Visit: Payer: Self-pay | Admitting: General Practice

## 2022-05-23 MED ORDER — ALBUTEROL SULFATE HFA 108 (90 BASE) MCG/ACT IN AERS
INHALATION_SPRAY | RESPIRATORY_TRACT | 0 refills | Status: DC
  Filled 2022-05-23: qty 6.7, 30d supply, fill #0

## 2022-05-24 ENCOUNTER — Other Ambulatory Visit (HOSPITAL_BASED_OUTPATIENT_CLINIC_OR_DEPARTMENT_OTHER): Payer: Self-pay

## 2022-05-24 MED ORDER — AMLODIPINE BESYLATE 5 MG PO TABS
7.5000 mg | ORAL_TABLET | Freq: Every day | ORAL | 3 refills | Status: DC
  Filled 2022-05-24: qty 45, 30d supply, fill #0
  Filled 2022-06-21: qty 45, 30d supply, fill #1
  Filled 2022-07-19: qty 45, 30d supply, fill #2
  Filled 2022-08-22: qty 45, 30d supply, fill #3

## 2022-05-27 ENCOUNTER — Other Ambulatory Visit: Payer: Self-pay | Admitting: Internal Medicine

## 2022-05-27 ENCOUNTER — Other Ambulatory Visit (HOSPITAL_BASED_OUTPATIENT_CLINIC_OR_DEPARTMENT_OTHER): Payer: Self-pay

## 2022-05-27 MED ORDER — RIVAROXABAN 20 MG PO TABS
20.0000 mg | ORAL_TABLET | Freq: Every day | ORAL | 3 refills | Status: DC
  Filled 2022-05-27: qty 90, 90d supply, fill #0
  Filled 2022-08-22: qty 90, 90d supply, fill #1
  Filled 2022-11-22: qty 30, 30d supply, fill #2
  Filled 2022-12-20: qty 30, 30d supply, fill #3
  Filled 2023-01-20: qty 30, 30d supply, fill #4
  Filled 2023-02-17: qty 30, 30d supply, fill #5
  Filled 2023-02-21: qty 90, 90d supply, fill #5

## 2022-05-28 DIAGNOSIS — R3129 Other microscopic hematuria: Secondary | ICD-10-CM | POA: Diagnosis not present

## 2022-05-28 DIAGNOSIS — R972 Elevated prostate specific antigen [PSA]: Secondary | ICD-10-CM | POA: Diagnosis not present

## 2022-06-03 ENCOUNTER — Other Ambulatory Visit (HOSPITAL_BASED_OUTPATIENT_CLINIC_OR_DEPARTMENT_OTHER): Payer: Self-pay

## 2022-06-03 ENCOUNTER — Other Ambulatory Visit: Payer: Self-pay | Admitting: Physician Assistant

## 2022-06-03 ENCOUNTER — Other Ambulatory Visit (HOSPITAL_COMMUNITY): Payer: Self-pay | Admitting: Physician Assistant

## 2022-06-03 MED ORDER — METOPROLOL TARTRATE 50 MG PO TABS
50.0000 mg | ORAL_TABLET | Freq: Two times a day (BID) | ORAL | 2 refills | Status: DC
  Filled 2022-06-03: qty 180, 90d supply, fill #0
  Filled 2022-08-29: qty 180, 90d supply, fill #1

## 2022-06-03 MED ORDER — MULTAQ 400 MG PO TABS
400.0000 mg | ORAL_TABLET | Freq: Two times a day (BID) | ORAL | 6 refills | Status: DC
  Filled 2022-06-03 – 2022-06-21 (×2): qty 60, 30d supply, fill #0
  Filled 2022-07-19: qty 60, 30d supply, fill #1
  Filled 2022-08-22 (×2): qty 60, 30d supply, fill #2
  Filled 2022-09-20: qty 60, 30d supply, fill #3
  Filled 2022-10-22: qty 60, 30d supply, fill #4

## 2022-06-04 ENCOUNTER — Other Ambulatory Visit (HOSPITAL_BASED_OUTPATIENT_CLINIC_OR_DEPARTMENT_OTHER): Payer: Self-pay

## 2022-06-21 ENCOUNTER — Other Ambulatory Visit (HOSPITAL_BASED_OUTPATIENT_CLINIC_OR_DEPARTMENT_OTHER): Payer: Self-pay

## 2022-06-26 DIAGNOSIS — M19011 Primary osteoarthritis, right shoulder: Secondary | ICD-10-CM | POA: Diagnosis not present

## 2022-06-26 DIAGNOSIS — M5416 Radiculopathy, lumbar region: Secondary | ICD-10-CM | POA: Diagnosis not present

## 2022-06-26 DIAGNOSIS — M1711 Unilateral primary osteoarthritis, right knee: Secondary | ICD-10-CM | POA: Diagnosis not present

## 2022-06-26 DIAGNOSIS — M1712 Unilateral primary osteoarthritis, left knee: Secondary | ICD-10-CM | POA: Diagnosis not present

## 2022-07-17 DIAGNOSIS — G4733 Obstructive sleep apnea (adult) (pediatric): Secondary | ICD-10-CM | POA: Diagnosis not present

## 2022-07-19 ENCOUNTER — Other Ambulatory Visit (HOSPITAL_BASED_OUTPATIENT_CLINIC_OR_DEPARTMENT_OTHER): Payer: Self-pay

## 2022-08-05 ENCOUNTER — Other Ambulatory Visit (HOSPITAL_BASED_OUTPATIENT_CLINIC_OR_DEPARTMENT_OTHER): Payer: Self-pay

## 2022-08-05 MED ORDER — ALBUTEROL SULFATE HFA 108 (90 BASE) MCG/ACT IN AERS
INHALATION_SPRAY | RESPIRATORY_TRACT | 0 refills | Status: DC
  Filled 2022-08-05: qty 6.7, 21d supply, fill #0

## 2022-08-05 MED ORDER — ERGOCALCIFEROL 1.25 MG (50000 UT) PO CAPS
100000.0000 [IU] | ORAL_CAPSULE | ORAL | 3 refills | Status: AC
  Filled 2022-08-05: qty 24, 42d supply, fill #0
  Filled 2022-09-20: qty 24, 42d supply, fill #1
  Filled 2022-11-19: qty 24, 42d supply, fill #2
  Filled 2022-12-20 – 2022-12-23 (×2): qty 24, 42d supply, fill #3

## 2022-08-22 ENCOUNTER — Other Ambulatory Visit (HOSPITAL_BASED_OUTPATIENT_CLINIC_OR_DEPARTMENT_OTHER): Payer: Self-pay

## 2022-08-22 MED ORDER — ALBUTEROL SULFATE HFA 108 (90 BASE) MCG/ACT IN AERS
INHALATION_SPRAY | RESPIRATORY_TRACT | 0 refills | Status: DC
  Filled 2022-08-22: qty 6.7, 21d supply, fill #0

## 2022-08-23 ENCOUNTER — Other Ambulatory Visit (HOSPITAL_BASED_OUTPATIENT_CLINIC_OR_DEPARTMENT_OTHER): Payer: Self-pay

## 2022-09-05 ENCOUNTER — Encounter: Payer: Self-pay | Admitting: Internal Medicine

## 2022-09-05 ENCOUNTER — Ambulatory Visit: Payer: 59 | Attending: Internal Medicine | Admitting: Internal Medicine

## 2022-09-05 VITALS — BP 131/80 | HR 67 | Ht 72.0 in | Wt >= 6400 oz

## 2022-09-05 DIAGNOSIS — G4733 Obstructive sleep apnea (adult) (pediatric): Secondary | ICD-10-CM

## 2022-09-05 DIAGNOSIS — I484 Atypical atrial flutter: Secondary | ICD-10-CM

## 2022-09-05 DIAGNOSIS — I4819 Other persistent atrial fibrillation: Secondary | ICD-10-CM

## 2022-09-05 NOTE — Patient Instructions (Signed)
Medication Instructions:  NO CHANGES  *If you need a refill on your cardiac medications before your next appointment, please call your pharmacy*   Follow-Up: At [facility], you and your health needs are our priority.  As part of our continuing mission to provide you with exceptional heart care, we have created designated Provider Care Teams.  These Care Teams include your primary Cardiologist (physician) and Advanced Practice Providers (APPs -  Physician Assistants and Nurse Practitioners) who all work together to provide you with the care you need, when you need it.  We recommend signing up for the patient portal called "MyChart".  Sign up information is provided on this After Visit Summary.  MyChart is used to connect with patients for Virtual Visits (Telemedicine).  Patients are able to view lab/test results, encounter notes, upcoming appointments, etc.  Non-urgent messages can be sent to your provider as well.   To learn more about what you can do with MyChart, go to [email or web address].    Your next appointment:   6 month(s)  The format for your next appointment:   In Person  Provider:   Kenneth C Hilty, MD   

## 2022-09-06 NOTE — Progress Notes (Addendum)
OFFICE FOLLOW-UP NOTE  Chief Complaint:  Routine follow-up  Primary Care Physician: Dois Davenport, MD  HPI:  Charles Barnett is a 56 y.o. male with a past medial history significant for morbid obesity, obstructive sleep apnea on CPAP, arthritis, hypertension and neuromuscular disorder and chronic low back pain on narcotics and status post spinal stimulator. He reports a one-week history of chest pain and cough, but in retrospect he's had fatigue and shortness of breath for several weeks. He presented to [facility] urgent care today for evaluation and he was found to have atrial flutter with rapid ventricular response. EMS was called to urgent care where he was evaluated and given 5 mg of IV Cardizem. Initial lab work in the ER was unremarkable. Initial troponin was negative. Chest x-ray shows mild cardiomegaly and mild interstitial prominence, probably suggestive of heart failure. He was placed on by mouth Cardizem and Xarelto and discharged with plan for outpatient cardioversion. He saw [place], PA-C, as an outpatient and was switched from Cardizem to Lopressor (there was a possible Cardizem side effect). LVEF was normal on echo. He underwent successful DC cardioversion and is maintaining sinus rhythm. He says he does report some energy increased. Blood pressure remains elevated however today. He was placed on losartan HCTZ which was discontinued. He was also noted recently to have elevated parathyroid hormone and hypercalcemia. He scheduled to see Dr. Everardo All with endocrinology.  02/17/2018  Charles Barnett returns today for follow-up.  Since I last saw him he is done well with regards to atrial flutter.  He denies any recurrence.  He has no chest pain or worsening shortness of breath.  Unfortunately weight has gone up further up to 448 pounds.  He is now in the super morbid obesity category with a BMI greater than 60.  Despite this blood pressure is well controlled today.  He is seen by [facility]  endocrinology with Dr. 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  He does not seem to have a diagnosis of diabetes at this point.  He reports significant knee and back pain for which she is on narcotics.  This limits his ambulation.  05/16/2020  Charles Barnett returns today for routine follow-up.  He denies any recurrent atrial fibrillation.  He reports strict compliance with the CPAP.  Unfortunately weight is variable and was as low as 380 pounds but now is back up to 448 pounds.  He is not established with any weight management clinic and is not considering gastric bypass surgery or procedures at this point.  He is working with Dr. Everardo All.  Blood pressure was a little elevated today.  EKG shows sinus rhythm.  He is due for some repeat labs next month.  He denies bleeding issues on Xarelto.  09/05/2022  Charles Barnett is seen today in follow-up.  He continues to follow with the atrial fibrillation clinic but has been referred back.  He is finally in a sinus rhythm and seems to be maintaining that on the dronedarone 40 mg twice daily.  He is also alto for anticoagulation.  Blood pressure appears to be well-controlled.  Weight unfortunately has increased substantially.  He is now up to 470 pounds, more than 100 pounds over his weight back in 2021.  He is going to try to work on this some more including planning to start water therapy at [facility].  PMHx:  Past Medical History:  Diagnosis Date   Arthritis    Atrial flutter (  HCC) 12/2016   Hypertension    Neuromuscular disorder (HCC)    Sleep apnea     Past Surgical History:  Procedure Laterality Date   CARDIOVERSION N/A 01/30/2017   Procedure: CARDIOVERSION;  Surgeon: Wendall Stade, MD;  Location: [facility] ENDOSCOPY;  Service: Cardiovascular;  Laterality: N/A;   CARDIOVERSION N/A 06/27/2021   Procedure: CARDIOVERSION;  Surgeon: Chrystie Nose, MD;  Location: [facility] ENDOSCOPY;  Service:  Cardiovascular;  Laterality: N/A;   CARDIOVERSION N/A 07/25/2021   Procedure: CARDIOVERSION;  Surgeon: Chilton Si, MD;  Location[facility] ENDOSCOPY;  Service: Cardiovascular;  Laterality: N/A;   CARDIOVERSION N/A 05/09/2022   Procedure: CARDIOVERSION;  Surgeon: Pricilla Riffle, MD;  Location: [facility] ENDOSCOPY;  Service: Cardiovascular;  Laterality: N/A;   SPINE SURGERY      FAMHx:  Family History  Problem Relation Age of Onset   Diabetes Mother    Hyperlipidemia Mother    Hypertension Mother    Heart disease Mother    Diabetes Sister    Hypertension Sister    Heart disease Sister    Hyperparathyroidism Neg Hx     SOCHx:   reports that he has never smoked. He has quit using smokeless tobacco.  His smokeless tobacco use included chew. He reports that he does not drink alcohol and does not use drugs.  ALLERGIES:  Allergies  Allergen Reactions   Ibuprofen Other (See Comments)    Bloody stool   Iodine Swelling    Including contrast dye     ROS: Pertinent items noted in HPI and remainder of comprehensive ROS otherwise negative.  HOME MEDS: Current Outpatient Medications on File Prior to Visit  Medication Sig Dispense Refill   albuterol (VENTOLIN HFA) 108 (90 Base) MCG/ACT inhaler Inhale 1 puff by mouth every 4 hours as needed 6.7 g 0   amLODipine (NORVASC) 5 MG tablet Take 1 & 1/2 tablets (7.5 mg total) by mouth daily. 45 tablet 3   baclofen (LIORESAL) 10 MG tablet Take 10 mg by mouth 3 (three) times daily as needed for muscle spasms.     Budeson-Glycopyrrol-Formoterol (BREZTRI AEROSPHERE) 160-9-4.8 MCG/ACT AERO Inhale 2 puffs into the lungs 2 (two) times daily. (Patient taking differently: Inhale 2 puffs into the lungs 2 (two) times daily as needed (respiratory issues.).) 10.7 g 0   dronedarone (MULTAQ) 400 MG tablet Take 1 tablet (400 mg total) by mouth 2 (two) times daily with a meal. 60 tablet 6   ergocalciferol (VITAMIN D2) 1.25 MG (50000 UT) capsule Take 2 capsules (100,000  Units total) by mouth 2 (two) times a week. 24 capsule 3   ipratropium-albuterol (DUONEB) 0.5-2.5 (3) MG/3ML SOLN use 3 ml via nebulizer every 6 hours as needed 90 mL 11   metoprolol tartrate (LOPRESSOR) 50 MG tablet Take 1 tablet (50 mg total) by mouth 2 (two) times daily with food. 180 tablet 2   rivaroxaban (XARELTO) 20 MG TABS tablet Take 1 tablet (20 mg total) by mouth daily with food. 90 tablet 3   No current facility-administered medications on file prior to visit.    LABS/IMAGING: No results found for this or any previous visit (from the past 48 hour(s)). No results found.  LIPID PANEL:    Component Value Date/Time   CHOL 176 08/08/2015 1348   TRIG 96 08/08/2015 1348   HDL 43 08/08/2015 1348   CHOLHDL 4.1 08/08/2015 1348   VLDL 19 08/08/2015 1348   LDLCALC 114 08/08/2015 1348     WEIGHTS: Wt Readings from Last 3  Encounters:  09/05/22 (!) 470 lb 12.8 oz (213.6 kg)  05/21/22 (!) 478 lb 3.2 oz (216.9 kg)  05/01/22 (!) 488 lb 3.2 oz (221.4 kg)    VITALS: BP 131/80   Pulse 67   Ht 6' (1.829 m)   Wt (!) 470 lb 12.8 oz (213.6 kg)   SpO2 95%   BMI 63.85 kg/m   EXAM: General appearance: alert, no distress and morbidly obese Neck: no carotid bruit and no JVD Lungs: clear to auscultation bilaterally Heart: regular rate and rhythm Abdomen: soft, non-tender; bowel sounds normal; no masses,  no organomegaly Extremities: extremities normal, atraumatic, no cyanosis or edema Pulses: 2+ and symmetric Skin: Skin color, texture, turgor normal. No rashes or lesions Neurologic: Grossly normal Psych: Pleasant  EKG: Deferred  ASSESSMENT: Persistent atrial fibrillation/atrial flutter status post DCCV-CHADSVASC score of 1 - on Multaq and Xarelto Hypertension-uncontrolled Super morbid obesity Obstructive sleep apnea on CPAP  PLAN: 1.   Charles Barnett continues to vacillate with regards to his weight.  He denies any recurrent atrial flutter or fibrillation.  He reports strict  compliance with CPAP.  Unfortunately weight is still an issue.  Blood pressure is still not well controlled although is variable he says is better at home.  He wants to start more exercise and activity but is limited by pain.  He is seeing a pain management specialist through [facility].  Plan follow-up with me annually or sooner as necessary.  Chrystie Nose, MD, [facility], FACP  [place]  [facility]  Medical Director of the Advanced Lipid Disorders &  Cardiovascular Risk Reduction Clinic Diplomate of the American Board of Clinical Lipidology Attending Cardiologist  Direct Dial: [phone]  Fax: [phone]  Website:  [email or web address] 09/06/2022, 8:54 AM

## 2022-09-13 DIAGNOSIS — R0902 Hypoxemia: Secondary | ICD-10-CM | POA: Diagnosis not present

## 2022-09-13 DIAGNOSIS — G4733 Obstructive sleep apnea (adult) (pediatric): Secondary | ICD-10-CM | POA: Diagnosis not present

## 2022-09-19 ENCOUNTER — Other Ambulatory Visit (HOSPITAL_BASED_OUTPATIENT_CLINIC_OR_DEPARTMENT_OTHER): Payer: Self-pay

## 2022-09-19 DIAGNOSIS — G4730 Sleep apnea, unspecified: Secondary | ICD-10-CM | POA: Diagnosis not present

## 2022-09-19 DIAGNOSIS — I4891 Unspecified atrial fibrillation: Secondary | ICD-10-CM | POA: Diagnosis not present

## 2022-09-19 DIAGNOSIS — G894 Chronic pain syndrome: Secondary | ICD-10-CM | POA: Diagnosis not present

## 2022-09-19 DIAGNOSIS — J449 Chronic obstructive pulmonary disease, unspecified: Secondary | ICD-10-CM | POA: Diagnosis not present

## 2022-09-19 DIAGNOSIS — E559 Vitamin D deficiency, unspecified: Secondary | ICD-10-CM | POA: Diagnosis not present

## 2022-09-19 DIAGNOSIS — I10 Essential (primary) hypertension: Secondary | ICD-10-CM | POA: Diagnosis not present

## 2022-09-19 DIAGNOSIS — Z0001 Encounter for general adult medical examination with abnormal findings: Secondary | ICD-10-CM | POA: Diagnosis not present

## 2022-09-19 MED ORDER — BREZTRI AEROSPHERE 160-9-4.8 MCG/ACT IN AERO
2.0000 | INHALATION_SPRAY | Freq: Two times a day (BID) | RESPIRATORY_TRACT | 0 refills | Status: DC
  Filled 2022-09-19: qty 10.7, 30d supply, fill #0

## 2022-09-20 ENCOUNTER — Other Ambulatory Visit: Payer: Self-pay

## 2022-09-20 ENCOUNTER — Other Ambulatory Visit (HOSPITAL_BASED_OUTPATIENT_CLINIC_OR_DEPARTMENT_OTHER): Payer: Self-pay

## 2022-09-20 ENCOUNTER — Other Ambulatory Visit: Payer: Self-pay | Admitting: Internal Medicine

## 2022-09-20 MED ORDER — AMLODIPINE BESYLATE 5 MG PO TABS
7.5000 mg | ORAL_TABLET | Freq: Every day | ORAL | 3 refills | Status: DC
  Filled 2022-09-20: qty 45, 30d supply, fill #0
  Filled 2022-10-22: qty 45, 30d supply, fill #1
  Filled 2022-11-19: qty 45, 30d supply, fill #2
  Filled 2022-12-20: qty 45, 30d supply, fill #3

## 2022-09-24 ENCOUNTER — Other Ambulatory Visit (HOSPITAL_BASED_OUTPATIENT_CLINIC_OR_DEPARTMENT_OTHER): Payer: Self-pay

## 2022-09-24 ENCOUNTER — Other Ambulatory Visit (HOSPITAL_COMMUNITY): Payer: Self-pay

## 2022-09-24 DIAGNOSIS — U071 COVID-19: Secondary | ICD-10-CM | POA: Diagnosis not present

## 2022-09-24 DIAGNOSIS — J019 Acute sinusitis, unspecified: Secondary | ICD-10-CM | POA: Diagnosis not present

## 2022-09-24 DIAGNOSIS — J9801 Acute bronchospasm: Secondary | ICD-10-CM | POA: Diagnosis not present

## 2022-09-24 DIAGNOSIS — R051 Acute cough: Secondary | ICD-10-CM | POA: Diagnosis not present

## 2022-09-24 MED ORDER — CEFUROXIME AXETIL 250 MG PO TABS
250.0000 mg | ORAL_TABLET | Freq: Two times a day (BID) | ORAL | 0 refills | Status: DC
  Filled 2022-09-24: qty 20, 10d supply, fill #0

## 2022-09-24 MED ORDER — BENZONATATE 200 MG PO CAPS
200.0000 mg | ORAL_CAPSULE | Freq: Three times a day (TID) | ORAL | 0 refills | Status: DC
  Filled 2022-09-24: qty 30, 10d supply, fill #0

## 2022-09-24 MED ORDER — PREDNISONE 10 MG PO TABS
ORAL_TABLET | ORAL | 0 refills | Status: DC
  Filled 2022-09-24 – 2022-10-17 (×2): qty 14, 10d supply, fill #0

## 2022-09-25 ENCOUNTER — Other Ambulatory Visit (HOSPITAL_BASED_OUTPATIENT_CLINIC_OR_DEPARTMENT_OTHER): Payer: Self-pay

## 2022-09-25 MED ORDER — PREDNISONE 20 MG PO TABS
ORAL_TABLET | ORAL | 0 refills | Status: AC
  Filled 2022-09-25: qty 14, 10d supply, fill #0

## 2022-10-09 ENCOUNTER — Encounter (HOSPITAL_BASED_OUTPATIENT_CLINIC_OR_DEPARTMENT_OTHER): Payer: Self-pay

## 2022-10-14 DIAGNOSIS — G4733 Obstructive sleep apnea (adult) (pediatric): Secondary | ICD-10-CM | POA: Diagnosis not present

## 2022-10-14 DIAGNOSIS — R0902 Hypoxemia: Secondary | ICD-10-CM | POA: Diagnosis not present

## 2022-10-17 ENCOUNTER — Other Ambulatory Visit (HOSPITAL_BASED_OUTPATIENT_CLINIC_OR_DEPARTMENT_OTHER): Payer: Self-pay

## 2022-10-17 MED ORDER — ALBUTEROL SULFATE HFA 108 (90 BASE) MCG/ACT IN AERS
INHALATION_SPRAY | RESPIRATORY_TRACT | 0 refills | Status: DC
  Filled 2022-10-17: qty 6.7, 21d supply, fill #0

## 2022-10-24 ENCOUNTER — Other Ambulatory Visit (HOSPITAL_BASED_OUTPATIENT_CLINIC_OR_DEPARTMENT_OTHER): Payer: Self-pay

## 2022-10-24 DIAGNOSIS — M1712 Unilateral primary osteoarthritis, left knee: Secondary | ICD-10-CM | POA: Diagnosis not present

## 2022-10-24 DIAGNOSIS — J449 Chronic obstructive pulmonary disease, unspecified: Secondary | ICD-10-CM | POA: Diagnosis not present

## 2022-10-24 DIAGNOSIS — R051 Acute cough: Secondary | ICD-10-CM | POA: Diagnosis not present

## 2022-10-24 DIAGNOSIS — D72829 Elevated white blood cell count, unspecified: Secondary | ICD-10-CM | POA: Diagnosis not present

## 2022-10-24 DIAGNOSIS — M1711 Unilateral primary osteoarthritis, right knee: Secondary | ICD-10-CM | POA: Diagnosis not present

## 2022-10-24 MED ORDER — MONTELUKAST SODIUM 10 MG PO TABS
10.0000 mg | ORAL_TABLET | Freq: Every evening | ORAL | 0 refills | Status: DC
  Filled 2022-10-24: qty 90, 90d supply, fill #0

## 2022-10-30 DIAGNOSIS — G4733 Obstructive sleep apnea (adult) (pediatric): Secondary | ICD-10-CM | POA: Diagnosis not present

## 2022-11-06 ENCOUNTER — Encounter (HOSPITAL_BASED_OUTPATIENT_CLINIC_OR_DEPARTMENT_OTHER): Payer: Self-pay | Admitting: Physical Therapy

## 2022-11-06 ENCOUNTER — Other Ambulatory Visit: Payer: Self-pay

## 2022-11-06 ENCOUNTER — Ambulatory Visit (HOSPITAL_BASED_OUTPATIENT_CLINIC_OR_DEPARTMENT_OTHER): Payer: Commercial Managed Care - PPO | Attending: Family Medicine | Admitting: Physical Therapy

## 2022-11-06 DIAGNOSIS — M25562 Pain in left knee: Secondary | ICD-10-CM | POA: Diagnosis not present

## 2022-11-06 DIAGNOSIS — R262 Difficulty in walking, not elsewhere classified: Secondary | ICD-10-CM | POA: Diagnosis not present

## 2022-11-06 DIAGNOSIS — G8929 Other chronic pain: Secondary | ICD-10-CM | POA: Insufficient documentation

## 2022-11-06 DIAGNOSIS — M25561 Pain in right knee: Secondary | ICD-10-CM | POA: Insufficient documentation

## 2022-11-06 DIAGNOSIS — M6281 Muscle weakness (generalized): Secondary | ICD-10-CM | POA: Insufficient documentation

## 2022-11-06 NOTE — Therapy (Unsigned)
OUTPATIENT PHYSICAL THERAPY LOWER EXTREMITY EVALUATION   Patient Name: Charles Barnett MRN: [id number] DOB:[birth year], 57 y.o., male Today's Date: 11/06/2022  END OF SESSION:  PT End of Session - 11/06/22 1919     Visit Number 1    Number of Visits 16    Date for PT Re-Evaluation 01/01/23    Authorization Type [place] aetna    PT Start Time 0820    PT Stop Time [id number]    PT Time Calculation (min) 34 min    Activity Tolerance Patient tolerated treatment well;Patient limited by pain    Behavior During Therapy [facility] for tasks assessed/performed             Past Medical History:  Diagnosis Date   Arthritis    Atrial flutter ([place]) 12/2016   Hypertension    Neuromuscular disorder ([place])    Sleep apnea    Past Surgical History:  Procedure Laterality Date   CARDIOVERSION N/A 01/30/2017   Procedure: CARDIOVERSION;  Surgeon: Josue Hector, MD;  Location: [place];  Service: Cardiovascular;  Laterality: N/A;   CARDIOVERSION N/A 06/27/2021   Procedure: CARDIOVERSION;  Surgeon: Pixie Casino, MD;  Location: [place];  Service: Cardiovascular;  Laterality: N/A;   CARDIOVERSION N/A 07/25/2021   Procedure: CARDIOVERSION;  Surgeon: Skeet Latch, MD;  Location: [place];  Service: Cardiovascular;  Laterality: N/A;   CARDIOVERSION N/A 05/09/2022   Procedure: CARDIOVERSION;  Surgeon: Fay Records, MD;  Location: [place];  Service: Cardiovascular;  Laterality: N/A;   SPINE SURGERY     Patient Active Problem List   Diagnosis Date Noted   Hyperparathyroidism, primary ([place]) 10/18/2017   Morbid obesity ([place]) 02/13/2017   High risk medication use 02/05/2017   Chronic pain syndrome 01/30/2017   Lumbosacral spondylosis 01/30/2017   Persistent atrial fibrillation ([place])    Hypercalcemia 01/09/2017   Atrial flutter ([place]) 01/07/2017   Left shoulder pain 10/18/2016   Right knee pain 10/18/2016   OSA (obstructive sleep apnea) 01/06/2014   Cannot sleep 10/01/2013    Body mass index (BMI) of 50-59.9 in adult ([place]) 06/25/2013   Postoperative back pain 05/27/2013   Essential hypertension 05/27/2013   H/O disease 03/22/2013    PCP: Hayden Rasmussen, MD   REFERRING PROVIDER: Hayden Rasmussen, MD   REFERRING DIAG: Degenerative joint disease of bilateral knees   THERAPY DIAG:  Chronic pain of both knees  Muscle weakness (generalized)  Difficulty in walking, not elsewhere classified  Rationale for Evaluation and Treatment: Rehabilitation  ONSET DATE: >10 yrs  SUBJECTIVE:   SUBJECTIVE STATEMENT: R knee > left.  Right knee goes out on me keeps popping. Interrupts sleeping. 2016 had water therapy for back and knee. Has seen orthopod in past and received injection which worked for a while (cortizone) and gel.  PERTINENT HISTORY: A-fib Unilateral primary osteoarthritis, left knee  Primary osteoarthritis, right shoulder  Unilateral primary osteoarthritis, right knee  Radiculopathy, lumbar region PAIN:  Are you having pain? Yes: NPRS scale: current 7/10  worst 10/10; least 5/10 Pain location: right knee Pain description: ache, sharp and annoying Aggravating factors: moving, standing x 44mnutes, walking with cane <10 Relieving factors: sitting, aleve  PRECAUTIONS: Knee, Cervical, Back, and Shoulder  WEIGHT BEARING RESTRICTIONS: No  FALLS:  Has patient fallen in last 6 months? Yes. Number of falls 3 from right knee giving  LIVING ENVIRONMENT: Lives with: lives with their family Lives in: House/apartment Stairs: No Has following equipment at home: Single point cane and WEnvironmental consultant-  2 wheeled  OCCUPATION: retired  PLOF: Independent with household mobility with device and Independent with community mobility with device  PATIENT GOALS: decrease pain, decrease weight  NEXT MD VISIT: 11/17/22  OBJECTIVE:   DIAGNOSTIC FINDINGS: No recent imaging in chart  PATIENT SURVEYS:  FOTO Risk adjusted 49% with goal of 55% 12th  visit  COGNITION: Overall cognitive status: Within functional limits for tasks assessed     SENSATION: Numbness in feet ~ 1 x month with lying   EDEMA:  occasional  MUSCLE LENGTH: Hamstrings: Right *** deg; Left *** deg   POSTURE: {posture:25561}  PALPATION: Unable  LOWER EXTREMITY ROM:  Active ROM Right eval Left eval  Hip flexion    Hip extension    Hip abduction    Hip adduction    Hip internal rotation    Hip external rotation    Knee flexion 95 102  Knee extension 0 slightly hyper mobile   Ankle dorsiflexion    Ankle plantarflexion    Ankle inversion    Ankle eversion     (Blank rows = not tested)  LOWER EXTREMITY MMT:  MMT Right eval Left eval  Hip flexion 52.9 57.8  Hip extension    Hip abduction 41.5 64.4  Hip adduction    Hip internal rotation    Hip external rotation    Knee flexion 63.5 62  Knee extension    Ankle dorsiflexion    Ankle plantarflexion    Ankle inversion    Ankle eversion     (Blank rows = not tested)  LOWER EXTREMITY SPECIAL TESTS:  Patellofemoral grind test: positive R and Left  FUNCTIONAL TESTS:  5 times sit to stand: 18.97 from massage table Timed up and go (TUG): to be assessed later  GAIT: Distance walked: 400 ft Assistive device utilized: Single point cane Level of assistance: Modified independence Comments: Pt using cane; lateral shift R and L with minimal knee and hip flex clearing feet from floor. Cadence slowed. Step length wnl   TODAY'S TREATMENT:                                                                                                                              Eval Objective testing Exercises:- Supine Quad Set   - Supine Heel Slide   - Seated Long Arc Quad   - Supine Active Straight Leg Raise     PATIENT EDUCATION:  Education details: Discussed eval findings, rehab rationale and POC and patient is in agreement Person educated: Patient Education method: Holiday representative Education comprehension: verbalized understanding  HOME EXERCISE PROGRAM: Access Code: [email or web address] URL: [email or web address] Date: 11/06/2022 Prepared by: Denton Meek  Exercises - Supine Quad Set  - 1 x daily - 7 x weekly - 3 sets - 10 reps - Supine Heel Slide  - 1 x daily - 7 x weekly - 3 sets - 10 reps - Seated Long Arc Quad  - 1 x daily -  7 x weekly - 3 sets - 10 reps - Supine Active Straight Leg Raise  - 1 x daily - 7 x weekly - 3 sets - 10 reps  ASSESSMENT:  CLINICAL IMPRESSION: Patient is a 57 y.o. M who was seen today for physical therapy evaluation and treatment for bilat OA knees.  He has other orthopeic issues which include cervical and lumbar spine as well as shoulder. Other limiting factor is a-fib.  He presents today with SOB after walk to setting which he recovers from quickly without distress.  Body habitus limiting assessment. 470lb  OBJECTIVE IMPAIRMENTS: Abnormal gait, cardiopulmonary status limiting activity, decreased activity tolerance, decreased endurance, decreased mobility, difficulty walking, decreased strength, obesity, and pain.   ACTIVITY LIMITATIONS: carrying, lifting, bending, sitting, standing, squatting, sleeping, stairs, and transfers  PARTICIPATION LIMITATIONS: cleaning, laundry, shopping, community activity, occupation, and yard work  PERSONAL FACTORS: Age, Behavior pattern, Fitness, and 3+ comorbidities: morbid obesity, a-fib, lumbar radiculopathy  are also affecting patient's functional outcome.   REHAB POTENTIAL: Good  CLINICAL DECISION MAKING: Unstable/unpredictable  EVALUATION COMPLEXITY: High   GOALS: Goals reviewed with patient? Yes  SHORT TERM GOALS: Target date: 12/04/22 Pt will tolerate full aquatic sessions consistently without increase in pain and with improving function to demonstrate good toleration and effectiveness of intervention.   Baseline: Goal status: {GOALSTATUS:25110}  2.  Pt will tolerate  stair climbing in and out of pool without difficulty Baseline:  Goal status: {GOALSTATUS:25110}  3.  *** Baseline:  Goal status: {GOALSTATUS:25110}  4.  *** Baseline:  Goal status: {GOALSTATUS:25110}  5.  *** Baseline:  Goal status: {GOALSTATUS:25110}  6.  *** Baseline:  Goal status: {GOALSTATUS:25110}  LONG TERM GOALS: Target date: 01/01/23  Pt to meet stated Foto Goal 55% Baseline: Risk adjusted 49%  Goal status: {GOALSTATUS:25110}  2.  Pt will be indep with final HEP's (land and aquatic as appropriate) for continued management of condition  Baseline:  Goal status: {GOALSTATUS:25110}  3.  *** Baseline:  Goal status: {GOALSTATUS:25110}  4.  *** Baseline:  Goal status: {GOALSTATUS:25110}  5.  *** Baseline:  Goal status: {GOALSTATUS:25110}  6.  *** Baseline:  Goal status: {GOALSTATUS:25110}   PLAN:  PT FREQUENCY: 1-2x/week  PT DURATION: 8 weeks  PLANNED INTERVENTIONS: Therapeutic exercises, Therapeutic activity, Neuromuscular re-education, Balance training, Gait training, Patient/Family education, Self Care, Joint mobilization, Joint manipulation, Stair training, Orthotic/Fit training, DME instructions, Aquatic Therapy, Dry Needling, Electrical stimulation, Cryotherapy, Moist heat, scar mobilization, Splintting, Taping, Traction, Ultrasound, Ionotophoresis 18m/ml Dexamethasone, Manual therapy, and Re-evaluation  PLAN FOR NEXT SESSION: Aquatics   FDenton Meek PT MPT 11/06/2022, 7:21 PM

## 2022-11-12 ENCOUNTER — Encounter (HOSPITAL_BASED_OUTPATIENT_CLINIC_OR_DEPARTMENT_OTHER): Payer: Self-pay | Admitting: Physical Therapy

## 2022-11-12 ENCOUNTER — Ambulatory Visit (HOSPITAL_BASED_OUTPATIENT_CLINIC_OR_DEPARTMENT_OTHER): Payer: Commercial Managed Care - PPO | Admitting: Physical Therapy

## 2022-11-12 DIAGNOSIS — R262 Difficulty in walking, not elsewhere classified: Secondary | ICD-10-CM

## 2022-11-12 DIAGNOSIS — M6281 Muscle weakness (generalized): Secondary | ICD-10-CM

## 2022-11-12 DIAGNOSIS — M25561 Pain in right knee: Secondary | ICD-10-CM | POA: Diagnosis not present

## 2022-11-12 DIAGNOSIS — G8929 Other chronic pain: Secondary | ICD-10-CM | POA: Diagnosis not present

## 2022-11-12 DIAGNOSIS — M25562 Pain in left knee: Secondary | ICD-10-CM | POA: Diagnosis not present

## 2022-11-12 NOTE — Therapy (Signed)
OUTPATIENT PHYSICAL THERAPY LOWER EXTREMITY TREATMENT   Patient Name: Charles Barnett MRN: [id number] DOB:03-Apr-1966, 57 y.o., male Today's Date: 11/12/2022  END OF SESSION:  PT End of Session - 11/12/22 1504     Visit Number 2    Number of Visits 16    Date for PT Re-Evaluation 01/01/23    Authorization Type [place] aetna    PT Start Time 1450    PT Stop Time 1530    PT Time Calculation (min) 40 min    Behavior During Therapy [facility] for tasks assessed/performed             Past Medical History:  Diagnosis Date   Arthritis    Atrial flutter ([place]) 12/2016   Hypertension    Neuromuscular disorder ([place])    Sleep apnea    Past Surgical History:  Procedure Laterality Date   CARDIOVERSION N/A 01/30/2017   Procedure: CARDIOVERSION;  Surgeon: Josue Hector, MD;  [facility] END[facility];  Service: Cardiovascular;  Laterality: N/A;   CARDIOVERSION N/A 06/27/2021   Procedure: CARDIOVERSION;  Surgeon: Pixie Casino, MD;  Location: [facility] ENDOSCOPY;  Service: Cardiovascular;  Laterality: N/A;   CARDIOVERSION N/A 07/25/2021   Procedure: CARDIOVERSION;  Surgeon: Skeet Latch, MD;  Location: [place];  Service: Cardiovascular;  Laterality: N/A;   CARDIOVERSION N/A 05/09/2022   Procedure: CARDIOVERSION;  Surgeon: Fay Records, MD;  Location: [place];  Service: Cardiovascular;  Laterality: N/A;   SPINE SURGERY     Patient Active Problem List   Diagnosis Date Noted   Hyperparathyroidism, primary ([place]) 10/18/2017   Morbid obesity ([place]) 02/13/2017   High risk medication use 02/05/2017   Chronic pain syndrome 01/30/2017   Lumbosacral spondylosis 01/30/2017   Persistent atrial fibrillation ([place])    Hypercalcemia 01/09/2017   Atrial flutter ([place]) 01/07/2017   Left shoulder pain 10/18/2016   Right knee pain 10/18/2016   OSA (obstructive sleep apnea) 01/06/2014   Cannot sleep 10/01/2013   Body mass index (BMI) of 50-59.9 in adult ([place]) 06/25/2013   Postoperative back  pain 05/27/2013   Essential hypertension 05/27/2013   H/O disease 03/22/2013    PCP: Hayden Rasmussen, MD   REFERRING PROVIDER: Hayden Rasmussen, MD   REFERRING DIAG: Degenerative joint disease of bilateral knees   THERAPY DIAG:  Chronic pain of both knees  Muscle weakness (generalized)  Difficulty in walking, not elsewhere classified  Rationale for Evaluation and Treatment: Rehabilitation  ONSET DATE: >10 yrs  SUBJECTIVE:   SUBJECTIVE STATEMENT: Pt reports no changes since evaluation.   PERTINENT HISTORY: A-fib Unilateral primary osteoarthritis, left knee  Primary osteoarthritis, right shoulder  Unilateral primary osteoarthritis, right knee  Radiculopathy, lumbar region PAIN:  Are you having pain? Yes: NPRS scale: current 7/10  worst 10/10; least 5/10 Pain location: right knee Pain description: ache, sharp and annoying Aggravating factors: moving, standing x 105mnutes, walking with cane <10 Relieving factors: sitting, aleve  PRECAUTIONS: Knee, Cervical, Back, and Shoulder  WEIGHT BEARING RESTRICTIONS: No  FALLS:  Has patient fallen in last 6 months? Yes. Number of falls 3 from right knee giving  LIVING ENVIRONMENT: Lives with: lives with their family Lives in: House/apartment Stairs: No Has following equipment at home: Single point cane and WEnvironmental consultant- 2 wheeled  OCCUPATION: retired  PLOF: Independent with household mobility with device and Independent with community mobility with device  PATIENT GOALS: decrease pain, decrease weight  NEXT MD VISIT: 11/17/22  OBJECTIVE:   DIAGNOSTIC FINDINGS: No recent imaging in chart  PATIENT SURVEYS:  FOTO Risk adjusted 49% with goal of 55% 12th visit  COGNITION: Overall cognitive status: Within functional limits for tasks assessed     SENSATION: Numbness in feet ~ 1 x month with lying   EDEMA:  occasional   POSTURE: flexed trunk   PALPATION: Unable to assess due to body habitus  LOWER EXTREMITY  ROM:  Active ROM Right eval Left eval  Hip flexion    Hip extension    Hip abduction    Hip adduction    Hip internal rotation    Hip external rotation    Knee flexion 95 may be limited due to body habitus 102  Knee extension 0 possibly hyper mobile 0  Ankle dorsiflexion    Ankle plantarflexion    Ankle inversion    Ankle eversion     (Blank rows = not tested)  LOWER EXTREMITY MMT:  MMT Right eval Left eval  Hip flexion 52.9 57.8  Hip extension    Hip abduction 41.5 64.4  Hip adduction    Hip internal rotation    Hip external rotation    Knee flexion 63.5 62  Knee extension    Ankle dorsiflexion    Ankle plantarflexion    Ankle inversion    Ankle eversion     (Blank rows = not tested)  LOWER EXTREMITY SPECIAL TESTS:  Patellofemoral grind test: positive R and Left  FUNCTIONAL TESTS:  5 times sit to stand: 18.97 from massage table Timed up and go (TUG): to be assessed later  GAIT: Distance walked: 400 ft Assistive device utilized: Single point cane Level of assistance: Modified independence Comments: Pt using cane; lateral shift R and L with minimal knee and hip flex clearing feet from floor. Cadence slowed with increased time spent right and left in stance. Step length wnl   TODAY'S TREATMENT:                                                                                                                              Pt seen for aquatic therapy today.  Treatment took place in water 3.25-4.5 ft in depth at the [facility]. Temp of water was 91.  Pt entered/exited the pool via stairs (backward for entry/forward for exit) with heavy bilat rail. * in deepest water: walking forward/backward/ side stepping/ marching forward backward/ forward walking kick * holding wall:  heel raises x 10, hip abdct x 10; hip ext x 10; hip openers/crosses x 10 * at stairs: quad stretch with foot on 2nd step x 30s x 2 each LE * return to walking in deeper water  Pt  requires the buoyancy and hydrostatic pressure of water for support, and to offload joints by unweighting joint load by at least 50 % in navel deep water and by at least 75-80% in chest to neck deep water.  Viscosity of the water is needed for resistance of strengthening. Water current perturbations provides challenge to standing  balance requiring increased core activation.    PATIENT EDUCATION:  Education details: aquatic modifications  Person educated: Patient Education method: Customer service manager Education comprehension: verbalized understanding  HOME EXERCISE PROGRAM: Access Code: [email or web address] URL: [email or web address] Date: 11/06/2022 Prepared by: Denton Meek  Exercises - Supine Quad Set  - 1 x daily - 7 x weekly - 3 sets - 10 reps - Supine Heel Slide  - 1 x daily - 7 x weekly - 3 sets - 10 reps - Seated Long Arc Quad  - 1 x daily - 7 x weekly - 3 sets - 10 reps - Supine Active Straight Leg Raise  - 1 x daily - 7 x weekly - 3 sets - 10 reps  ASSESSMENT:  CLINICAL IMPRESSION: Pt confident in aquatic setting and able to take direction from therapist on deck.  He requires minor cues for posture and form.  Quads are tight; stretch at stairs was plenty for today. He had slight decrease in overall pain until stretch at stairs.  He will benefit from skilled physical therapy intervention to improve functional mobility, gait, strength, and toleration to activity. Goals are ongoing.   OBJECTIVE IMPAIRMENTS: Abnormal gait, cardiopulmonary status limiting activity, decreased activity tolerance, decreased endurance, decreased mobility, difficulty walking, decreased strength, obesity, and pain.   ACTIVITY LIMITATIONS: carrying, lifting, bending, sitting, standing, squatting, sleeping, stairs, and transfers  PARTICIPATION LIMITATIONS: cleaning, laundry, shopping, community activity, occupation, and yard work  PERSONAL FACTORS: Age, Behavior pattern, Fitness, and 3+  comorbidities: morbid obesity, a-fib, lumbar radiculopathy  are also affecting patient's functional outcome.   REHAB POTENTIAL: Good  CLINICAL DECISION MAKING: Unstable/unpredictable  EVALUATION COMPLEXITY: High   GOALS: Goals reviewed with patient? Yes  SHORT TERM GOALS: Target date: 12/04/22 Pt will tolerate full aquatic sessions consistently without increase in pain and with improving function to demonstrate good toleration and effectiveness of intervention.   Baseline:TBA Goal status: INITIAL  2.  Pt will tolerate stair climbing in and out of pool indep without difficulty Baseline: TBA Goal status: INITIAL  3.  Pt will improve on 5 X STS test to <or=  14s  to demonstrate improving functional lower extremity strength, transitional movements, and balance  Baseline: 18.97 from massage table Goal status: INITIAL    LONG TERM GOALS: Target date: 01/01/23  Pt to meet stated Foto Goal 55% Baseline: Risk adjusted 49%  Goal status: INITIAL  2.  Pt will be indep with final HEP's (land and aquatic as appropriate) for continued management of condition  Baseline: TBA Goal status: INITIAL  3.  Pt will report amb up towards 30 minutes without requiring seated rest period Baseline: <10 Goal status: INITIAL  4.  Pt will not have any episode of "R knee giving" causing fall. Baseline: 3 last 6 months Goal status: INITIAL  5.  Pt will have increase in strength R knee and hip by 10lbs Baseline: see chart Goal status: INITIAL     PLAN:  PT FREQUENCY: 1-2x/week  PT DURATION: 8 weeks  PLANNED INTERVENTIONS: Therapeutic exercises, Therapeutic activity, Neuromuscular re-education, Balance training, Gait training, Patient/Family education, Self Care, Joint mobilization, Joint manipulation, Stair training, Orthotic/Fit training, DME instructions, Aquatic Therapy, Dry Needling, Electrical stimulation, Cryotherapy, Moist heat, scar mobilization, Splintting, Taping, Traction, Ultrasound,  Ionotophoresis '4mg'$ /ml Dexamethasone, Manual therapy, and Re-evaluation  PLAN FOR NEXT SESSION: Aquatics: strengthening LE, gait, balance, aerobic capacity, monitor sats and pulse as needed Kerin Perna, PTA 11/12/22 6:29 PM [place], [place],  [phone] Phone: [phone]   Fax:  [phone]

## 2022-11-14 DIAGNOSIS — G4733 Obstructive sleep apnea (adult) (pediatric): Secondary | ICD-10-CM | POA: Diagnosis not present

## 2022-11-14 DIAGNOSIS — R0902 Hypoxemia: Secondary | ICD-10-CM | POA: Diagnosis not present

## 2022-11-19 ENCOUNTER — Other Ambulatory Visit: Payer: Self-pay

## 2022-11-19 ENCOUNTER — Ambulatory Visit (HOSPITAL_COMMUNITY)
Admission: RE | Admit: 2022-11-19 | Discharge: 2022-11-19 | Disposition: A | Discharge status: Home / Self Care | Payer: Commercial Managed Care - PPO | Source: Ambulatory Visit | Attending: Physician Assistant | Admitting: Physician Assistant

## 2022-11-19 ENCOUNTER — Ambulatory Visit (HOSPITAL_BASED_OUTPATIENT_CLINIC_OR_DEPARTMENT_OTHER): Payer: Commercial Managed Care - PPO | Attending: Family Medicine | Admitting: Physical Therapy

## 2022-11-19 ENCOUNTER — Encounter (HOSPITAL_COMMUNITY): Payer: Self-pay | Admitting: Physician Assistant

## 2022-11-19 ENCOUNTER — Other Ambulatory Visit (HOSPITAL_BASED_OUTPATIENT_CLINIC_OR_DEPARTMENT_OTHER): Payer: Self-pay

## 2022-11-19 ENCOUNTER — Encounter (HOSPITAL_BASED_OUTPATIENT_CLINIC_OR_DEPARTMENT_OTHER): Payer: Self-pay | Admitting: Physical Therapy

## 2022-11-19 VITALS — BP 132/80 | HR 66 | Ht 72.0 in | Wt >= 6400 oz

## 2022-11-19 DIAGNOSIS — I10 Essential (primary) hypertension: Secondary | ICD-10-CM | POA: Insufficient documentation

## 2022-11-19 DIAGNOSIS — I4892 Unspecified atrial flutter: Secondary | ICD-10-CM | POA: Insufficient documentation

## 2022-11-19 DIAGNOSIS — G4733 Obstructive sleep apnea (adult) (pediatric): Secondary | ICD-10-CM | POA: Diagnosis not present

## 2022-11-19 DIAGNOSIS — Z6841 Body Mass Index (BMI) 40.0 and over, adult: Secondary | ICD-10-CM | POA: Diagnosis not present

## 2022-11-19 DIAGNOSIS — M25561 Pain in right knee: Secondary | ICD-10-CM | POA: Insufficient documentation

## 2022-11-19 DIAGNOSIS — I4510 Unspecified right bundle-branch block: Secondary | ICD-10-CM | POA: Insufficient documentation

## 2022-11-19 DIAGNOSIS — M6281 Muscle weakness (generalized): Secondary | ICD-10-CM

## 2022-11-19 DIAGNOSIS — M25562 Pain in left knee: Secondary | ICD-10-CM | POA: Insufficient documentation

## 2022-11-19 DIAGNOSIS — I4819 Other persistent atrial fibrillation: Secondary | ICD-10-CM | POA: Insufficient documentation

## 2022-11-19 DIAGNOSIS — R262 Difficulty in walking, not elsewhere classified: Secondary | ICD-10-CM

## 2022-11-19 DIAGNOSIS — G8929 Other chronic pain: Secondary | ICD-10-CM

## 2022-11-19 MED ORDER — ALBUTEROL SULFATE HFA 108 (90 BASE) MCG/ACT IN AERS
INHALATION_SPRAY | RESPIRATORY_TRACT | 0 refills | Status: DC
  Filled 2022-11-19: qty 6.7, 20d supply, fill #0

## 2022-11-19 MED ORDER — METOPROLOL TARTRATE 50 MG PO TABS
50.0000 mg | ORAL_TABLET | Freq: Two times a day (BID) | ORAL | 2 refills | Status: DC
  Filled 2022-11-19: qty 180, 90d supply, fill #0
  Filled 2023-02-24: qty 180, 90d supply, fill #1
  Filled 2023-05-20: qty 180, 90d supply, fill #2

## 2022-11-19 MED ORDER — MULTAQ 400 MG PO TABS
400.0000 mg | ORAL_TABLET | Freq: Two times a day (BID) | ORAL | 6 refills | Status: DC
  Filled 2022-11-19: qty 60, 30d supply, fill #0
  Filled 2022-12-20: qty 60, 30d supply, fill #1
  Filled 2023-01-17: qty 60, 30d supply, fill #2
  Filled 2023-02-13: qty 60, 30d supply, fill #3
  Filled 2023-03-13: qty 60, 30d supply, fill #4
  Filled 2023-04-10: qty 60, 30d supply, fill #5
  Filled 2023-05-08: qty 60, 30d supply, fill #6

## 2022-11-19 NOTE — Therapy (Signed)
OUTPATIENT PHYSICAL THERAPY LOWER EXTREMITY TREATMENT   Patient Name: Charles Barnett MRN: [id number] DOB:[birth year], 57 y.o., male Today's Date: 11/19/2022  END OF SESSION:  PT End of Session - 11/19/22 1458     Visit Number 3    Number of Visits 16    Date for PT Re-Evaluation 01/01/23    Authorization Type [place] aetna    PT Start Time 1449    PT Stop Time 1530    PT Time Calculation (min) 41 min    Behavior During Therapy [facility] for tasks assessed/performed             Past Medical History:  Diagnosis Date   Arthritis    Atrial flutter ([place]) 12/2016   Hypertension    Neuromuscular disorder ([place])    Sleep apnea    Past Surgical History:  Procedure Laterality Date   CARDIOVERSION N/A 01/30/2017   Procedure: CARDIOVERSION;  Surgeon: Josue Hector, MD;  Location: [facility] ENDOSCOPY;  Service: Cardiovascular;  Laterality: N/A;   CARDIOVERSION N/A 06/27/2021   Procedure: CARDIOVERSION;  Surgeon: Pixie Casino, MD;  Location: [place];  Service: Cardiovascular;  Laterality: N/A;   CARDIOVERSION N/A 07/25/2021   Procedure: CARDIOVERSION;  Surgeon: Skeet Latch, MD;  Location: [place];  Service: Cardiovascular;  Laterality: N/A;   CARDIOVERSION N/A 05/09/2022   Procedure: CARDIOVERSION;  Surgeon: Fay Records, MD;  Location: [place];  Service: Cardiovascular;  Laterality: N/A;   [place]     Patient Active Problem List   Diagnosis Date Noted   Hyperparathyroidism, primary ([place]) 10/18/2017   Morbid obesity ([place]) 02/13/2017   High risk medication use 02/05/2017   Chronic pain syndrome 01/30/2017   Lumbosacral spondylosis 01/30/2017   Persistent atrial fibrillation ([place])    Hypercalcemia 01/09/2017   Atrial flutter ([place]) 01/07/2017   Left shoulder pain 10/18/2016   Right knee pain 10/18/2016   OSA (obstructive sleep apnea) 01/06/2014   Cannot sleep 10/01/2013   Body mass index (BMI) of 50-59.9 in adult ([place]) 06/25/2013   Postoperative back  pain 05/27/2013   Essential hypertension 05/27/2013   H/O disease 03/22/2013    PCP: Hayden Rasmussen, MD   REFERRING PROVIDER: Hayden Rasmussen, MD   REFERRING DIAG: Degenerative joint disease of bilateral knees   THERAPY DIAG:  Chronic pain of both knees  Muscle weakness (generalized)  Difficulty in walking, not elsewhere classified  Rationale for Evaluation and Treatment: Rehabilitation  ONSET DATE: >10 yrs  SUBJECTIVE:   SUBJECTIVE STATEMENT: Pt reports he was sore for 1.5 days after last session.  He reports he had relief while he was in the water.  He states he did a lot of walking on Sunday.    PERTINENT HISTORY: A-fib Unilateral primary osteoarthritis, left knee  Primary osteoarthritis, right shoulder  Unilateral primary osteoarthritis, right knee  Radiculopathy, lumbar region PAIN:  Are you having pain? Yes: NPRS scale: 9/10 Pain location: bilat knee, lower back Pain description: ache, sharp and annoying Aggravating factors: moving, standing x 42mnutes, walking with cane <10 Relieving factors: sitting, aleve  PRECAUTIONS: Knee, Cervical, Back, and Shoulder  WEIGHT BEARING RESTRICTIONS: No  FALLS:  Has patient fallen in last 6 months? Yes. Number of falls 3 from right knee giving  LIVING ENVIRONMENT: Lives with: lives with their family Lives in: House/apartment Stairs: No Has following equipment at home: Single point cane and WEnvironmental consultant- 2 wheeled  OCCUPATION: retired  PLOF: Independent with household mobility with device and Independent with community mobility  with device  PATIENT GOALS: decrease pain, decrease weight  NEXT MD VISIT: 11/17/22  OBJECTIVE:   DIAGNOSTIC FINDINGS: No recent imaging in chart  PATIENT SURVEYS:  FOTO Risk adjusted 49% with goal of 55% 12th visit  COGNITION: Overall cognitive status: Within functional limits for tasks assessed     SENSATION: Numbness in feet ~ 1 x month with lying   EDEMA:   occasional   POSTURE: flexed trunk   PALPATION: Unable to assess due to body habitus  LOWER EXTREMITY ROM:  Active ROM Right eval Left eval  Hip flexion    Hip extension    Hip abduction    Hip adduction    Hip internal rotation    Hip external rotation    Knee flexion 95 may be limited due to body habitus 102  Knee extension 0 possibly hyper mobile 0  Ankle dorsiflexion    Ankle plantarflexion    Ankle inversion    Ankle eversion     (Blank rows = not tested)  LOWER EXTREMITY MMT:  MMT Right eval Left eval  Hip flexion 52.9 57.8  Hip extension    Hip abduction 41.5 64.4  Hip adduction    Hip internal rotation    Hip external rotation    Knee flexion 63.5 62  Knee extension    Ankle dorsiflexion    Ankle plantarflexion    Ankle inversion    Ankle eversion     (Blank rows = not tested)  LOWER EXTREMITY SPECIAL TESTS:  Patellofemoral grind test: positive R and Left  FUNCTIONAL TESTS:  5 times sit to stand: 18.97 from massage table Timed up and go (TUG): to be assessed later  GAIT: Distance walked: 400 ft Assistive device utilized: Single point cane Level of assistance: Modified independence Comments: Pt using cane; lateral shift R and L with minimal knee and hip flex clearing feet from floor. Cadence slowed with increased time spent right and left in stance. Step length wnl   TODAY'S TREATMENT:                                                                                                                              Pt seen for aquatic therapy today.  Treatment took place in water 3.25-4.5 ft in depth at the [facility]. Temp of water was 91.  Pt entered/exited the pool via stairs with heavy bilat rail. * in deepest water: walking forward/backward/ side stepping * side stepping with yellow hand floats and arm addct  * bilat yellow hand floats at side with marching forward backward * holding wall:  heel raises x 10 , hip abdct x 10 x 2 ;  hip ext to toe touch x 10 x 2 * return to walking forward/ backward  * holding wall: hip openers/crosses x 10 each * seated on bench:  STS x 5, alternating LAQ with DF x 10each; cycling with legs; long sitting hip abdct/ addct  *  TUG like exercise x 3 reps  * return to walking forward/ backward in deeper water * at stairs: quad stretch with foot on 2nd step x 30s x 2 each LE  Pt requires the buoyancy and hydrostatic pressure of water for support, and to offload joints by unweighting joint load by at least 50 % in navel deep water and by at least 75-80% in chest to neck deep water.  Viscosity of the water is needed for resistance of strengthening. Water current perturbations provides challenge to standing balance requiring increased core activation.    PATIENT EDUCATION:  Education details: aquatic modifications  Person educated: Patient Education method: Customer service manager Education comprehension: verbalized understanding  HOME EXERCISE PROGRAM: Access Code: [email or web address] URL: [email or web address] Date: 11/06/2022 Prepared by: Denton Meek  Exercises - Supine Quad Set  - 1 x daily - 7 x weekly - 3 sets - 10 reps - Supine Heel Slide  - 1 x daily - 7 x weekly - 3 sets - 10 reps - Seated Long Arc Quad  - 1 x daily - 7 x weekly - 3 sets - 10 reps - Supine Active Straight Leg Raise  - 1 x daily - 7 x weekly - 3 sets - 10 reps  ASSESSMENT:  CLINICAL IMPRESSION: Pt was able to increase quantity of exercises without increase in pain.  He reported reduction of pain in knees to 6/10 during session.  Pt able to use 3rd step for stretch without difficulty.  He will benefit from skilled physical therapy intervention to improve functional mobility, gait, strength, and toleration to activity. Goals are ongoing.   OBJECTIVE IMPAIRMENTS: Abnormal gait, cardiopulmonary status limiting activity, decreased activity tolerance, decreased endurance, decreased mobility, difficulty  walking, decreased strength, obesity, and pain.   ACTIVITY LIMITATIONS: carrying, lifting, bending, sitting, standing, squatting, sleeping, stairs, and transfers  PARTICIPATION LIMITATIONS: cleaning, laundry, shopping, community activity, occupation, and yard work  PERSONAL FACTORS: Age, Behavior pattern, Fitness, and 3+ comorbidities: morbid obesity, a-fib, lumbar radiculopathy  are also affecting patient's functional outcome.   REHAB POTENTIAL: Good  CLINICAL DECISION MAKING: Unstable/unpredictable  EVALUATION COMPLEXITY: High   GOALS: Goals reviewed with patient? Yes  SHORT TERM GOALS: Target date: 12/04/22 Pt will tolerate full aquatic sessions consistently without increase in pain and with improving function to demonstrate good toleration and effectiveness of intervention.   Baseline:TBA Goal status: INITIAL  2.  Pt will tolerate stair climbing in and out of pool indep without difficulty Baseline: TBA Goal status: INITIAL  3.  Pt will improve on 5 X STS test to <or=  14s  to demonstrate improving functional lower extremity strength, transitional movements, and balance  Baseline: 18.97 from massage table Goal status: INITIAL    LONG TERM GOALS: Target date: 01/01/23  Pt to meet stated Foto Goal 55% Baseline: Risk adjusted 49%  Goal status: INITIAL  2.  Pt will be indep with final HEP's (land and aquatic as appropriate) for continued management of condition  Baseline: TBA Goal status: INITIAL  3.  Pt will report amb up towards 30 minutes without requiring seated rest period Baseline: <10 Goal status: INITIAL  4.  Pt will not have any episode of "R knee giving" causing fall. Baseline: 3 last 6 months Goal status: INITIAL  5.  Pt will have increase in strength R knee and hip by 10lbs Baseline: see chart Goal status: INITIAL     PLAN:  PT FREQUENCY: 1-2x/week  PT DURATION: 8 weeks  PLANNED INTERVENTIONS: Therapeutic exercises, Therapeutic  activity,  Neuromuscular re-education, Balance training, Gait training, Patient/Family education, Self Care, Joint mobilization, Joint manipulation, Stair training, Orthotic/Fit training, DME instructions, Aquatic Therapy, Dry Needling, Electrical stimulation, Cryotherapy, Moist heat, scar mobilization, Splintting, Taping, Traction, Ultrasound, Ionotophoresis '4mg'$ /ml Dexamethasone, Manual therapy, and Re-evaluation  PLAN FOR NEXT SESSION: Aquatics: strengthening LE, gait, balance, aerobic capacity, monitor sats and pulse as needed    Kerin Perna, PTA 11/19/22 3:28 PM [facility] [address] [place], [place], [address] Phone: [phone]   Fax:  [phone]

## 2022-11-19 NOTE — Progress Notes (Signed)
Primary Care Physician: Hayden Rasmussen, MD Primary Cardiologist: Dr Debara Pickett  Primary Electrophysiologist: none Referring Physician: Coletta Memos NP   Charles Barnett is a 57 y.o. male with a history of morbid obesity, OSA, atrial flutter, HTN, chronic back pain s/p spinal stimulator who presents for follow up in the [facility].  The patient was initially diagnosed with atrial flutter in 2018 after presenting to urgent care with symptoms of SOB and fatigue. He was started on Xarelto for a CHADS2VASC score of 1 and underwent DCCV on 01/30/17. He was found to be back in atrial flutter on follow up 06/04/21 and had another DCCV on 06/27/21. Unfortunately, he had quick return of his atrial flutter. He was started on Multaq and had repeat DCCV on 07/25/21. Patient is s/p DCCV on 05/09/22.   On follow up today, patient reports that he has done well from a cardiac standpoint. He has not had any palpitations or heart racing. He is doing aquatic therapy at [facility] to try and help him be more active. No bleeding issues on anticoagulation.   Today, he denies symptoms of palpitations, chest pain, shortness of breath, orthopnea, PND, lower extremity edema, dizziness, presyncope, syncope, bleeding, or neurologic sequela. The patient is tolerating medications without difficulties and is otherwise without complaint today.    Atrial Fibrillation Risk Factors:  he does have symptoms or diagnosis of sleep apnea. he is compliant with CPAP therapy. he does not have a history of rheumatic fever.   he has a BMI of Body mass index is 65.53 kg/m.Marland Kitchen Filed Weights   11/19/22 1020  Weight: (!) 219.2 kg    Family History  Problem Relation Age of Onset   Diabetes Mother    Hyperlipidemia Mother    Hypertension Mother    Heart disease Mother    Diabetes Sister    Hypertension Sister    Heart disease Sister    Hyperparathyroidism Neg Hx      Atrial Fibrillation Management  history:  Previous antiarrhythmic drugs: Multaq Previous cardioversions: 01/30/17, 06/27/21, 07/25/21, 05/09/22 Previous ablations: none CHADS2VASC score: 1 Anticoagulation history: Xarelto    Past Medical History:  Diagnosis Date   Arthritis    Atrial flutter ([place]) 12/2016   Hypertension    Neuromuscular disorder ([place])    Sleep apnea    Past Surgical History:  Procedure Laterality Date   CARDIOVERSION N/A 01/30/2017   Procedure: CARDIOVERSION;  Surgeon: Josue Hector, MD;  Location: [place];  Service: Cardiovascular;  Laterality: N/A;   CARDIOVERSION N/A 06/27/2021   Procedure: CARDIOVERSION;  Surgeon: Pixie Casino, MD;  Location: [facility] ENDOSCOPY;  Service: Cardiovascular;  Laterality: N/A;   CARDIOVERSION N/A 07/25/2021   Procedure: CARDIOVERSION;  Surgeon: Skeet Latch, MD;  Location: [place];  Service: Cardiovascular;  Laterality: N/A;   CARDIOVERSION N/A 05/09/2022   Procedure: CARDIOVERSION;  Surgeon: Fay Records, MD;  Location: [place];  Service: Cardiovascular;  Laterality: N/A;   SPINE SURGERY      Current Outpatient Medications  Medication Sig Dispense Refill   albuterol (VENTOLIN HFA) 108 (90 Base) MCG/ACT inhaler Inhale 1 puff by mouth every 4 hours as needed 6.7 g 0   amLODipine (NORVASC) 5 MG tablet Take 1 & 1/2 tablets (7.5 mg total) by mouth daily. 45 tablet 3   Budeson-Glycopyrrol-Formoterol (BREZTRI AEROSPHERE) 160-9-4.8 MCG/ACT AERO Inhale 2 puffs into the lungs 2 (two) times daily. (Patient taking differently: Inhale 2 puffs into the lungs 2 (two) times daily  as needed (respiratory issues.).) 10.7 g 0   Budeson-Glycopyrrol-Formoterol (BREZTRI AEROSPHERE) 160-9-4.8 MCG/ACT AERO Take 2 puffs by mouth 2 (two) times daily. 10.7 g 0   ergocalciferol (VITAMIN D2) 1.25 MG (50000 UT) capsule Take 2 capsules (100,000 Units total) by mouth 2 (two) times a week. 24 capsule 3   ipratropium-albuterol (DUONEB) 0.5-2.5 (3) MG/3ML SOLN use 3 ml via  nebulizer every 6 hours as needed 90 mL 11   montelukast (SINGULAIR) 10 MG tablet Take 1 tablet (10 mg total) by mouth every evening. 90 tablet 0   rivaroxaban (XARELTO) 20 MG TABS tablet Take 1 tablet (20 mg total) by mouth daily with food. 90 tablet 3   baclofen (LIORESAL) 10 MG tablet Take 10 mg by mouth 3 (three) times daily as needed for muscle spasms.     dronedarone (MULTAQ) 400 MG tablet Take 1 tablet (400 mg total) by mouth 2 (two) times daily with a meal. 60 tablet 6   metoprolol tartrate (LOPRESSOR) 50 MG tablet Take 1 tablet (50 mg total) by mouth 2 (two) times daily with food. 180 tablet 2   No current facility-administered medications for this encounter.    Allergies  Allergen Reactions   Ibuprofen Other (See Comments)    Bloody stool   Iodine Swelling    Including contrast dye     Social History   Socioeconomic History   Marital status: Married    Spouse name: Not on file   Number of children: 3   Years of education: Not on file   Highest education level: Not on file  Occupational History   Not on file  Tobacco Use   Smoking status: Never   Smokeless tobacco: Former    Types: Chew   Tobacco comments:    Former Chew Tobacco 07/18/2021  Vaping Use   Vaping Use: Never used  Substance and Sexual Activity   Alcohol use: No    Alcohol/week: 0.0 standard drinks of alcohol   Drug use: No   Sexual activity: Yes    Partners: Female  Other Topics Concern   Not on file  Social History Narrative   Not on file   Social Determinants of Health   Financial Resource Strain: Not on file  Food Insecurity: Not on file  Transportation Needs: Not on file  Physical Activity: Not on file  Stress: Not on file  Social Connections: Not on file  Intimate Partner Violence: Not on file     ROS- All systems are reviewed and negative except as per the HPI above.  Physical Exam: Vitals:   11/19/22 1020  BP: 132/80  Pulse: 66  Weight: (!) 219.2 kg  Height: 6' (1.829 m)      GEN- The patient is a well appearing obese male, alert and oriented x 3 today.   HEENT-head normocephalic, atraumatic, sclera clear, conjunctiva pink, hearing intact, trachea midline. Lungs- Clear to ausculation bilaterally, normal work of breathing Heart- Regular rate and rhythm, no murmurs, rubs or gallops  GI- soft, NT, ND, + BS Extremities- no clubbing, cyanosis, or edema MS- no significant deformity or atrophy Skin- no rash or lesion Psych- euthymic mood, full affect Neuro- strength and sensation are intact   Wt Readings from Last 3 Encounters:  11/19/22 (!) 219.2 kg  09/05/22 (!) 213.6 kg  05/21/22 (!) 216.9 kg    EKG today demonstrates  SR, 1st degree AV block Vent. rate 66 BPM PR interval 238 ms QRS duration 100 ms QT/QTcB 424/444 ms  Echo  01/08/17 demonstrated  - Left ventricle: The cavity size was normal. Wall thickness was    increased in a pattern of mild LVH. Systolic function was normal.    The estimated ejection fraction was in the range of 55% to 60%.    Indeterminant diastolic function (atrial fibrillation). Although    no diagnostic regional wall motion abnormality was identified,    this possibility cannot be completely excluded on the basis of    this study.  - Ventricular septum: Mildly D-shaped interventricular septum,    suggestive of RV pressure/volume overload.  - Aortic valve: There was no stenosis.  - Mitral valve: There was no significant regurgitation.  - Right ventricle: The cavity size was mildly to moderately    dilated. Systolic function was normal.  - Right atrium: The atrium was mildly dilated.  - Atrial septum: Atrial septal aneurysm noted.  - Pulmonary arteries: No complete TR doppler jet so unable to    estimate PA systolic pressure.  - Systemic veins: IVC was not visualized.   Impressions:   - Technically difficult study with poor acoustic windows. Normal LV    size with mild LV hypertrophy. EF 55-60%. Mildly D-shaped     interventricular septum is suggestive of a degree of RV    pressure/volume overload. Mild to moderate RV dilation with    normal systolic function. Unable to estimate PA systolic pressure    on this study.   Epic records are reviewed at length today  CHA2DS2-VASc Score = 1  The patient's score is based upon: CHF History: 0 HTN History: 1 Diabetes History: 0 Stroke History: 0 Vascular Disease History: 0 Age Score: 0 Gender Score: 0        ASSESSMENT AND PLAN: 1. Persistent atrial fibrillation/Atrial flutter The patient's CHA2DS2-VASc score is 1, indicating a 0.6% annual risk of stroke.   Would avoid class IC with LAFB and inc RBBB on ECG.  S/p DCCV 05/09/22 Patient appears to be maintaining SR. Continue Multaq 400 mg BID Dofetilide would be the next option if he has quick return of afib. Continue Xarelto 20 mg daily Continue Lopressor 50 mg BID Kardia mobile for home monitoring.   2. Morbid Obesity Body mass index is 65.53 kg/m. Suspect this is significantly contributing the persistence of his arrhythmias.  Patient declined referral to [facility].  Lifestyle modification was discussed and encouraged including regular physical activity and weight reduction. He is doing aquatic therapy.   3. Obstructive sleep apnea Encouraged compliance with CPAP therapy.  4. HTN Stable, no changes today.   Follow up with Dr Debara Pickett as scheduled. AF clinic in 6 months.    [facility] [address] [place], [place] [address] [phone] 11/19/2022 10:44 AM

## 2022-11-21 ENCOUNTER — Ambulatory Visit (HOSPITAL_BASED_OUTPATIENT_CLINIC_OR_DEPARTMENT_OTHER): Payer: Commercial Managed Care - PPO | Admitting: Physical Therapy

## 2022-11-21 ENCOUNTER — Encounter (HOSPITAL_BASED_OUTPATIENT_CLINIC_OR_DEPARTMENT_OTHER): Payer: Self-pay | Admitting: Physical Therapy

## 2022-11-21 DIAGNOSIS — M6281 Muscle weakness (generalized): Secondary | ICD-10-CM

## 2022-11-21 DIAGNOSIS — R262 Difficulty in walking, not elsewhere classified: Secondary | ICD-10-CM | POA: Diagnosis not present

## 2022-11-21 DIAGNOSIS — M25561 Pain in right knee: Secondary | ICD-10-CM | POA: Diagnosis not present

## 2022-11-21 DIAGNOSIS — M25562 Pain in left knee: Secondary | ICD-10-CM | POA: Insufficient documentation

## 2022-11-21 DIAGNOSIS — G8929 Other chronic pain: Secondary | ICD-10-CM | POA: Insufficient documentation

## 2022-11-21 NOTE — Therapy (Signed)
OUTPATIENT PHYSICAL THERAPY LOWER EXTREMITY TREATMENT   Patient Name: Charles Barnett MRN: [id number] DOB:[birth year], 57 y.o., male Today's Date: 11/21/2022  END OF SESSION:  PT End of Session - 11/21/22 0909     Visit Number 4    Number of Visits 16    Date for PT Re-Evaluation 01/01/23    Authorization Type [place] aetna    PT Start Time 0858    PT Stop Time 0936    PT Time Calculation (min) 38 min    Activity Tolerance Patient tolerated treatment well    Behavior During Therapy [facility] for tasks assessed/performed             Past Medical History:  Diagnosis Date   Arthritis    Atrial flutter ([place]) 12/2016   Hypertension    Neuromuscular disorder ([place])    Sleep apnea    Past Surgical History:  Procedure Laterality Date   CARDIOVERSION N/A 01/30/2017   Procedure: CARDIOVERSION;  Surgeon: Josue Hector, MD;  Location: [place];  Service: Cardiovascular;  Laterality: N/A;   CARDIOVERSION N/A 06/27/2021   Procedure: CARDIOVERSION;  Surgeon: Pixie Casino, MD;  Location: [place];  Service: Cardiovascular;  Laterality: N/A;   CARDIOVERSION N/A 07/25/2021   Procedure: CARDIOVERSION;  Surgeon: Skeet Latch, MD;  Location: [place];  Service: Cardiovascular;  Laterality: N/A;   CARDIOVERSION N/A 05/09/2022   Procedure: CARDIOVERSION;  Surgeon: Fay Records, MD;  Location: [place];  Service: Cardiovascular;  Laterality: N/A;   SPINE SURGERY     Patient Active Problem List   Diagnosis Date Noted   Hyperparathyroidism, primary ([place]) 10/18/2017   Morbid obesity ([place]) 02/13/2017   High risk medication use 02/05/2017   Chronic pain syndrome 01/30/2017   Lumbosacral spondylosis 01/30/2017   Persistent atrial fibrillation ([place])    Hypercalcemia 01/09/2017   Atrial flutter ([place]) 01/07/2017   Left shoulder pain 10/18/2016   Right knee pain 10/18/2016   OSA (obstructive sleep apnea) 01/06/2014   Cannot sleep 10/01/2013   Body mass index (BMI) of  50-59.9 in adult ([place]) 06/25/2013   Postoperative back pain 05/27/2013   Essential hypertension 05/27/2013   H/O disease 03/22/2013    PCP: Hayden Rasmussen, MD   REFERRING PROVIDER: Hayden Rasmussen, MD   REFERRING DIAG: Degenerative joint disease of bilateral knees   THERAPY DIAG:  Chronic pain of both knees  Muscle weakness (generalized)  Difficulty in walking, not elsewhere classified  Rationale for Evaluation and Treatment: Rehabilitation  ONSET DATE: >10 yrs  SUBJECTIVE:   SUBJECTIVE STATEMENT: Pt reports greater ease in bending over to get shoes on.  He states he was not as sore after last session as he was with first.   PERTINENT HISTORY: A-fib Unilateral primary osteoarthritis, left knee  Primary osteoarthritis, right shoulder  Unilateral primary osteoarthritis, right knee  Radiculopathy, lumbar region PAIN:  Are you having pain? Yes: NPRS scale: 7/10 Pain location: bilat knee, lower back Pain description: ache, sharp and annoying Aggravating factors: moving, standing x 8mnutes, walking with cane <10 Relieving factors: sitting, aleve  PRECAUTIONS: Knee, Cervical, Back, and Shoulder  WEIGHT BEARING RESTRICTIONS: No  FALLS:  Has patient fallen in last 6 months? Yes. Number of falls 3 from right knee giving  LIVING ENVIRONMENT: Lives with: lives with their family Lives in: House/apartment Stairs: No Has following equipment at home: Single point cane and WEnvironmental consultant- 2 wheeled  OCCUPATION: retired  PLOF: Independent with household mobility with device and Independent with community  mobility with device  PATIENT GOALS: decrease pain, decrease weight  NEXT MD VISIT: 11/17/22  OBJECTIVE:   DIAGNOSTIC FINDINGS: No recent imaging in chart  PATIENT SURVEYS:  FOTO Risk adjusted 49% with goal of 55% 12th visit  COGNITION: Overall cognitive status: Within functional limits for tasks assessed     SENSATION: Numbness in feet ~ 1 x month with lying    EDEMA:  occasional   POSTURE: flexed trunk   PALPATION: Unable to assess due to body habitus  LOWER EXTREMITY ROM:  Active ROM Right eval Left eval  Hip flexion    Hip extension    Hip abduction    Hip adduction    Hip internal rotation    Hip external rotation    Knee flexion 95 may be limited due to body habitus 102  Knee extension 0 possibly hyper mobile 0  Ankle dorsiflexion    Ankle plantarflexion    Ankle inversion    Ankle eversion     (Blank rows = not tested)  LOWER EXTREMITY MMT:  MMT Right eval Left eval  Hip flexion 52.9 57.8  Hip extension    Hip abduction 41.5 64.4  Hip adduction    Hip internal rotation    Hip external rotation    Knee flexion 63.5 62  Knee extension    Ankle dorsiflexion    Ankle plantarflexion    Ankle inversion    Ankle eversion     (Blank rows = not tested)  LOWER EXTREMITY SPECIAL TESTS:  Patellofemoral grind test: positive R and Left  FUNCTIONAL TESTS:  5 times sit to stand: 18.97 from massage table Timed up and go (TUG): to be assessed later  GAIT: Distance walked: 400 ft Assistive device utilized: Single point cane Level of assistance: Modified independence Comments: Pt using cane; lateral shift R and L with minimal knee and hip flex clearing feet from floor. Cadence slowed with increased time spent right and left in stance. Step length wnl   TODAY'S TREATMENT:                                                                                                                              Pt seen for aquatic therapy today.  Treatment took place in water 3.25-4.5 ft in depth at the [facility]. Temp of water was 91.  Pt entered/exited the pool via stairs with heavy bilat rail. * in deepest water: walking forward/backward/ side stepping * side stepping with yellow -> blue hand floats and arm addct  * UE support on blue hand floats: hip abdct/addct x 10; Leg swings into hip flex/ext x 10 each; heel  raises x 20 * Blue hand floats at side with marching forward backward; repeated with 1 hand float at side * holding wall: hip openers/crosses x 10 each; hip abdct/ addct x 10; LE swings into hip flex/ext x 10 * squats pushing blue hand float under water at 66f 6" x  15   * TUG like exercise x 5 reps  * seated on bench:  cycling with legs; long sitting hip abdct/ addct  * at stairs: quad stretch with foot on 3rd step x 30s x 2 each LE  Pt requires the buoyancy and hydrostatic pressure of water for support, and to offload joints by unweighting joint load by at least 50 % in navel deep water and by at least 75-80% in chest to neck deep water.  Viscosity of the water is needed for resistance of strengthening. Water current perturbations provides challenge to standing balance requiring increased core activation.    PATIENT EDUCATION:  Education details: aquatic modifications  Person educated: Patient Education method: Customer service manager Education comprehension: verbalized understanding  HOME EXERCISE PROGRAM: Access Code: [email or web address] URL: [email or web address] Date: 11/06/2022 Prepared by: Denton Meek  Exercises - Supine Quad Set  - 1 x daily - 7 x weekly - 3 sets - 10 reps - Supine Heel Slide  - 1 x daily - 7 x weekly - 3 sets - 10 reps - Seated Long Arc Quad  - 1 x daily - 7 x weekly - 3 sets - 10 reps - Supine Active Straight Leg Raise  - 1 x daily - 7 x weekly - 3 sets - 10 reps  ASSESSMENT:  CLINICAL IMPRESSION: Pt reporting positive effect from aquatic therapy thus far with slight improvement in mobility with ADL.  He reported reduction of pain in knees to during session. He will benefit from skilled physical therapy intervention to improve functional mobility, gait, strength, and toleration to activity. He is progressing towards established goals.  ( Ck 5x STS next visit)  OBJECTIVE IMPAIRMENTS: Abnormal gait, cardiopulmonary status limiting activity,  decreased activity tolerance, decreased endurance, decreased mobility, difficulty walking, decreased strength, obesity, and pain.   ACTIVITY LIMITATIONS: carrying, lifting, bending, sitting, standing, squatting, sleeping, stairs, and transfers  PARTICIPATION LIMITATIONS: cleaning, laundry, shopping, community activity, occupation, and yard work  PERSONAL FACTORS: Age, Behavior pattern, Fitness, and 3+ comorbidities: morbid obesity, a-fib, lumbar radiculopathy  are also affecting patient's functional outcome.   REHAB POTENTIAL: Good  CLINICAL DECISION MAKING: Unstable/unpredictable  EVALUATION COMPLEXITY: High   GOALS: Goals reviewed with patient? Yes  SHORT TERM GOALS: Target date: 12/04/22 Pt will tolerate full aquatic sessions consistently without increase in pain and with improving function to demonstrate good toleration and effectiveness of intervention.   Baseline:TBA Goal status: INITIAL  2.  Pt will tolerate stair climbing in and out of pool indep without difficulty Baseline: TBA Goal status: INITIAL  3.  Pt will improve on 5 X STS test to <or=  14s  to demonstrate improving functional lower extremity strength, transitional movements, and balance  Baseline: 18.97 from massage table Goal status: INITIAL    LONG TERM GOALS: Target date: 01/01/23  Pt to meet stated Foto Goal 55% Baseline: Risk adjusted 49%  Goal status: INITIAL  2.  Pt will be indep with final HEP's (land and aquatic as appropriate) for continued management of condition  Baseline: TBA Goal status: INITIAL  3.  Pt will report amb up towards 30 minutes without requiring seated rest period Baseline: <10 Goal status: INITIAL  4.  Pt will not have any episode of "R knee giving" causing fall. Baseline: 3 last 6 months Goal status: INITIAL  5.  Pt will have increase in strength R knee and hip by 10lbs Baseline: see chart Goal status: INITIAL     PLAN:  PT FREQUENCY: 1-2x/week  PT  DURATION: 8  weeks  PLANNED INTERVENTIONS: Therapeutic exercises, Therapeutic activity, Neuromuscular re-education, Balance training, Gait training, Patient/Family education, Self Care, Joint mobilization, Joint manipulation, Stair training, Orthotic/Fit training, DME instructions, Aquatic Therapy, Dry Needling, Electrical stimulation, Cryotherapy, Moist heat, scar mobilization, Splintting, Taping, Traction, Ultrasound, Ionotophoresis '4mg'$ /ml Dexamethasone, Manual therapy, and Re-evaluation  PLAN FOR NEXT SESSION: Aquatics: strengthening LE, gait, balance, aerobic capacity, monitor sats and pulse as needed  Kerin Perna, PTA 11/21/22 9:41 AM [facility] [address] [place], [place], [address] Phone: [phone]   Fax:  [phone]

## 2022-11-22 ENCOUNTER — Other Ambulatory Visit (HOSPITAL_BASED_OUTPATIENT_CLINIC_OR_DEPARTMENT_OTHER): Payer: Self-pay

## 2022-11-26 ENCOUNTER — Ambulatory Visit (HOSPITAL_BASED_OUTPATIENT_CLINIC_OR_DEPARTMENT_OTHER): Payer: Commercial Managed Care - PPO | Admitting: Physical Therapy

## 2022-11-26 ENCOUNTER — Encounter (HOSPITAL_BASED_OUTPATIENT_CLINIC_OR_DEPARTMENT_OTHER): Payer: Self-pay | Admitting: Physical Therapy

## 2022-11-26 DIAGNOSIS — R262 Difficulty in walking, not elsewhere classified: Secondary | ICD-10-CM | POA: Diagnosis not present

## 2022-11-26 DIAGNOSIS — M6281 Muscle weakness (generalized): Secondary | ICD-10-CM | POA: Diagnosis not present

## 2022-11-26 DIAGNOSIS — M25561 Pain in right knee: Secondary | ICD-10-CM | POA: Diagnosis not present

## 2022-11-26 DIAGNOSIS — G8929 Other chronic pain: Secondary | ICD-10-CM | POA: Diagnosis not present

## 2022-11-26 DIAGNOSIS — M25562 Pain in left knee: Secondary | ICD-10-CM | POA: Diagnosis not present

## 2022-11-26 NOTE — Therapy (Signed)
OUTPATIENT PHYSICAL THERAPY LOWER EXTREMITY TREATMENT   Patient Name: Charles Barnett MRN: [id number] DOB:February 15, 1966, 57 y.o., male Today's Date: 11/26/2022  END OF SESSION:  PT End of Session - 11/26/22 1708     Visit Number 5    Number of Visits 16    Date for PT Re-Evaluation 01/01/23    Authorization Type [place] aetna    PT Start Time 1658    PT Stop Time 1740    PT Time Calculation (min) 42 min    Behavior During Therapy [facility] for tasks assessed/performed             Past Medical History:  Diagnosis Date   Arthritis    Atrial flutter ([place]) 12/2016   Hypertension    Neuromuscular disorder ([place])    Sleep apnea    Past Surgical History:  Procedure Laterality Date   CARDIOVERSION N/A 01/30/2017   Procedure: CARDIOVERSION;  Surgeon: Josue Hector, MD;  Location: [facility] ENDOSCOPY;  Service: Cardiovascular;  Laterality: N/A;   CARDIOVERSION N/A 06/27/2021   Procedure: CARDIOVERSION;  Surgeon: Pixie Casino, MD;  Location: [place];  Service: Cardiovascular;  Laterality: N/A;   CARDIOVERSION N/A 07/25/2021   Procedure: CARDIOVERSION;  Surgeon: Skeet Latch, MD;  Location: [place];  Service: Cardiovascular;  Laterality: N/A;   CARDIOVERSION N/A 05/09/2022   Procedure: CARDIOVERSION;  Surgeon: Fay Records, MD;  Location: [place];  Service: Cardiovascular;  Laterality: N/A;   [place]     Patient Active Problem List   Diagnosis Date Noted   Hyperparathyroidism, primary ([place]) 10/18/2017   Morbid obesity ([place]) 02/13/2017   High risk medication use 02/05/2017   Chronic pain syndrome 01/30/2017   Lumbosacral spondylosis 01/30/2017   Persistent atrial fibrillation ([place])    Hypercalcemia 01/09/2017   Atrial flutter ([place]) 01/07/2017   Left shoulder pain 10/18/2016   Right knee pain 10/18/2016   OSA (obstructive sleep apnea) 01/06/2014   Cannot sleep 10/01/2013   Body mass index (BMI) of 50-59.9 in adult ([place]) 06/25/2013   Postoperative back  pain 05/27/2013   Essential hypertension 05/27/2013   H/O disease 03/22/2013    PCP: Hayden Rasmussen, MD   REFERRING PROVIDER: Hayden Rasmussen, MD   REFERRING DIAG: Degenerative joint disease of bilateral knees   THERAPY DIAG:  Chronic pain of both knees  Muscle weakness (generalized)  Difficulty in walking, not elsewhere classified  Rationale for Evaluation and Treatment: Rehabilitation  ONSET DATE: >10 yrs  SUBJECTIVE:   SUBJECTIVE STATEMENT: Pt reports he spent time working in yard (mowing a couple passes) and picking up things in yard.  Pain has increased.   PERTINENT HISTORY: A-fib Unilateral primary osteoarthritis, left knee  Primary osteoarthritis, right shoulder  Unilateral primary osteoarthritis, right knee  Radiculopathy, lumbar region PAIN:  Are you having pain? Yes: NPRS scale: 9/10 Pain location: bilat knee, lower back Pain description: ache, sharp and annoying Aggravating factors: moving, standing x 31mnutes, walking with cane <10 Relieving factors: sitting, aleve  PRECAUTIONS: Knee, Cervical, Back, and Shoulder  WEIGHT BEARING RESTRICTIONS: No  FALLS:  Has patient fallen in last 6 months? Yes. Number of falls 3 from right knee giving  LIVING ENVIRONMENT: Lives with: lives with their family Lives in: House/apartment Stairs: No Has following equipment at home: Single point cane and WEnvironmental consultant- 2 wheeled  OCCUPATION: retired  PLOF: Independent with household mobility with device and Independent with community mobility with device  PATIENT GOALS: decrease pain, decrease weight  NEXT MD VISIT:  11/17/22  OBJECTIVE:   DIAGNOSTIC FINDINGS: No recent imaging in chart  PATIENT SURVEYS:  FOTO Risk adjusted 49% with goal of 55% 12th visit  COGNITION: Overall cognitive status: Within functional limits for tasks assessed     SENSATION: Numbness in feet ~ 1 x month with lying   EDEMA:  occasional   POSTURE: flexed trunk    PALPATION: Unable to assess due to body habitus  LOWER EXTREMITY ROM:  Active ROM Right eval Left eval  Hip flexion    Hip extension    Hip abduction    Hip adduction    Hip internal rotation    Hip external rotation    Knee flexion 95 may be limited due to body habitus 102  Knee extension 0 possibly hyper mobile 0  Ankle dorsiflexion    Ankle plantarflexion    Ankle inversion    Ankle eversion     (Blank rows = not tested)  LOWER EXTREMITY MMT:  MMT Right eval Left eval  Hip flexion 52.9 57.8  Hip extension    Hip abduction 41.5 64.4  Hip adduction    Hip internal rotation    Hip external rotation    Knee flexion 63.5 62  Knee extension    Ankle dorsiflexion    Ankle plantarflexion    Ankle inversion    Ankle eversion     (Blank rows = not tested)  LOWER EXTREMITY SPECIAL TESTS:  Patellofemoral grind test: positive R and Left  FUNCTIONAL TESTS:  5 times sit to stand: 18.97 from massage table Timed up and go (TUG): to be assessed later  GAIT: Distance walked: 400 ft Assistive device utilized: Single point cane Level of assistance: Modified independence Comments: Pt using cane; lateral shift R and L with minimal knee and hip flex clearing feet from floor. Cadence slowed with increased time spent right and left in stance. Step length wnl   TODAY'S TREATMENT:                                                                                                                              Pt seen for aquatic therapy today.  Treatment took place in water 3.5-4.75 ft in depth at the [facility] pool. Temp of water was 91.  Pt entered/exited the pool via stairs with heavy bilat rail. * in deepest water: walking forward/backward/ side stepping * side stepping with yellow arm addct  * walking forward/backward with single/double yellow hand floats at side for increased core engagement * holding wall: hip openers/crosses x 10 each; hip abdct/ addct x 10; LE  swings into hip flex/ext x 10; L stretch  * plank with hands on bench with hip ext x 10 each * plank with hands on yellow noodle on bench with attempts to move noodle off bench (into plank or superman UE), but LEs float up * STS at bench x 5 * TrA set with solid noodle pull down x 10 *  return to walking forward/backward with reciprocal arm swing * trial of straddling yellow noodles (2) in deeper water for spine decompression * quad stretch with foot on 3rd step x 15s each LE   Pt requires the buoyancy and hydrostatic pressure of water for support, and to offload joints by unweighting joint load by at least 50 % in navel deep water and by at least 75-80% in chest to neck deep water.  Viscosity of the water is needed for resistance of strengthening. Water current perturbations provides challenge to standing balance requiring increased core activation.    PATIENT EDUCATION:  Education details: aquatic modifications  Person educated: Patient Education method: Customer service manager Education comprehension: verbalized understanding  HOME EXERCISE PROGRAM: Access Code: [email or web address] URL: [email or web address] Date: 11/06/2022 Prepared by: Denton Meek  Exercises - Supine Quad Set  - 1 x daily - 7 x weekly - 3 sets - 10 reps - Supine Heel Slide  - 1 x daily - 7 x weekly - 3 sets - 10 reps - Seated Long Arc Quad  - 1 x daily - 7 x weekly - 3 sets - 10 reps - Supine Active Straight Leg Raise  - 1 x daily - 7 x weekly - 3 sets - 10 reps  ASSESSMENT:  CLINICAL IMPRESSION: Pt reported reduction of pain in back and knees to 6/10 until attempting plank on noodle. Pain gradually reduced with return to walking in deeper water. He requires more than one noodle for improved buoyancy in deeper water, and requires UE support on wall to complete.  He will benefit from skilled physical therapy intervention to improve functional mobility, gait, strength, and toleration to activity. He is  progressing towards established goals.  ( Ck 5x STS next visit)  OBJECTIVE IMPAIRMENTS: Abnormal gait, cardiopulmonary status limiting activity, decreased activity tolerance, decreased endurance, decreased mobility, difficulty walking, decreased strength, obesity, and pain.   ACTIVITY LIMITATIONS: carrying, lifting, bending, sitting, standing, squatting, sleeping, stairs, and transfers  PARTICIPATION LIMITATIONS: cleaning, laundry, shopping, community activity, occupation, and yard work  PERSONAL FACTORS: Age, Behavior pattern, Fitness, and 3+ comorbidities: morbid obesity, a-fib, lumbar radiculopathy  are also affecting patient's functional outcome.   REHAB POTENTIAL: Good  CLINICAL DECISION MAKING: Unstable/unpredictable  EVALUATION COMPLEXITY: High   GOALS: Goals reviewed with patient? Yes  SHORT TERM GOALS: Target date: 12/04/22 Pt will tolerate full aquatic sessions consistently without increase in pain and with improving function to demonstrate good toleration and effectiveness of intervention.   Baseline:TBA Goal status: INITIAL  2.  Pt will tolerate stair climbing in and out of pool indep without difficulty Baseline: TBA Goal status: INITIAL  3.  Pt will improve on 5 X STS test to <or=  14s  to demonstrate improving functional lower extremity strength, transitional movements, and balance  Baseline: 18.97 from massage table Goal status: INITIAL    LONG TERM GOALS: Target date: 01/01/23  Pt to meet stated Foto Goal 55% Baseline: Risk adjusted 49%  Goal status: INITIAL  2.  Pt will be indep with final HEP's (land and aquatic as appropriate) for continued management of condition  Baseline: TBA Goal status: INITIAL  3.  Pt will report amb up towards 30 minutes without requiring seated rest period Baseline: <10 Goal status: INITIAL  4.  Pt will not have any episode of "R knee giving" causing fall. Baseline: 3 last 6 months Goal status: INITIAL  5.  Pt will have  increase in strength R knee and hip by 10lbs Baseline: see chart  Goal status: INITIAL     PLAN:  PT FREQUENCY: 1-2x/week  PT DURATION: 8 weeks  PLANNED INTERVENTIONS: Therapeutic exercises, Therapeutic activity, Neuromuscular re-education, Balance training, Gait training, Patient/Family education, Self Care, Joint mobilization, Joint manipulation, Stair training, Orthotic/Fit training, DME instructions, Aquatic Therapy, Dry Needling, Electrical stimulation, Cryotherapy, Moist heat, scar mobilization, Splintting, Taping, Traction, Ultrasound, Ionotophoresis '4mg'$ /ml Dexamethasone, Manual therapy, and Re-evaluation  PLAN FOR NEXT SESSION: Aquatics: strengthening LE, gait, balance, aerobic capacity, monitor sats and pulse as needed  Kerin Perna, PTA 11/26/22 5:48 PM [facility] [address] [place], [place], [address] Phone: [phone]   Fax:  [phone]

## 2022-11-28 ENCOUNTER — Ambulatory Visit (HOSPITAL_BASED_OUTPATIENT_CLINIC_OR_DEPARTMENT_OTHER): Payer: Commercial Managed Care - PPO | Admitting: Physical Therapy

## 2022-11-28 ENCOUNTER — Encounter (HOSPITAL_BASED_OUTPATIENT_CLINIC_OR_DEPARTMENT_OTHER): Payer: Self-pay | Admitting: Physical Therapy

## 2022-11-28 DIAGNOSIS — M6281 Muscle weakness (generalized): Secondary | ICD-10-CM

## 2022-11-28 DIAGNOSIS — R262 Difficulty in walking, not elsewhere classified: Secondary | ICD-10-CM

## 2022-11-28 DIAGNOSIS — M25562 Pain in left knee: Secondary | ICD-10-CM | POA: Diagnosis not present

## 2022-11-28 DIAGNOSIS — G8929 Other chronic pain: Secondary | ICD-10-CM

## 2022-11-28 DIAGNOSIS — G4733 Obstructive sleep apnea (adult) (pediatric): Secondary | ICD-10-CM | POA: Diagnosis not present

## 2022-11-28 DIAGNOSIS — M25561 Pain in right knee: Secondary | ICD-10-CM | POA: Diagnosis not present

## 2022-11-28 NOTE — Therapy (Signed)
OUTPATIENT PHYSICAL THERAPY LOWER EXTREMITY TREATMENT   Patient Name: Charles Barnett MRN: [id number] DOB:1965-12-25, 57 y.o., male Today's Date: 11/28/2022  END OF SESSION:  PT End of Session - 11/28/22 1630     Visit Number 6    Number of Visits 16    Date for PT Re-Evaluation 01/01/23    Authorization Type [employer] aetna    PT Start Time 1617    PT Stop Time 1655    PT Time Calculation (min) 38 min    Behavior During Therapy [facility] for tasks assessed/performed             Past Medical History:  Diagnosis Date   Arthritis    Atrial flutter ([place]) 12/2016   Hypertension    Neuromuscular disorder ([place])    Sleep apnea    Past Surgical History:  Procedure Laterality Date   CARDIOVERSION N/A 01/30/2017   Procedure: CARDIOVERSION;  Surgeon: Josue Hector, MD;  Location: [place];  Service: Cardiovascular;  Laterality: N/A;   CARDIOVERSION N/A 06/27/2021   Procedure: CARDIOVERSION;  Surgeon: Pixie Casino, MD;  Location: [place];  Service: Cardiovascular;  Laterality: N/A;   CARDIOVERSION N/A 07/25/2021   Procedure: CARDIOVERSION;  Surgeon: Skeet Latch, MD;  Location: [place];  Service: Cardiovascular;  Laterality: N/A;   CARDIOVERSION N/A 05/09/2022   Procedure: CARDIOVERSION;  Surgeon: Fay Records, MD;  Location: [place];  Service: Cardiovascular;  Laterality: N/A;   SPINE SURGERY     Patient Active Problem List   Diagnosis Date Noted   Hyperparathyroidism, primary ([place]) 10/18/2017   Morbid obesity ([place]) 02/13/2017   High risk medication use 02/05/2017   Chronic pain syndrome 01/30/2017   Lumbosacral spondylosis 01/30/2017   Persistent atrial fibrillation ([place])    Hypercalcemia 01/09/2017   Atrial flutter ([place]) 01/07/2017   Left shoulder pain 10/18/2016   Right knee pain 10/18/2016   OSA (obstructive sleep apnea) 01/06/2014   Cannot sleep 10/01/2013   Body mass index (BMI) of 50-59.9 in adult ([place] ) 06/25/2013   Postoperative back  pain 05/27/2013   Essential hypertension 05/27/2013   H/O disease 03/22/2013    PCP: Hayden Rasmussen, MD   REFERRING PROVIDER: Hayden Rasmussen, MD   REFERRING DIAG: Degenerative joint disease of bilateral knees   THERAPY DIAG:  Chronic pain of both knees  Difficulty in walking, not elsewhere classified  Muscle weakness (generalized)  Rationale for Evaluation and Treatment: Rehabilitation  ONSET DATE: >10 yrs  SUBJECTIVE:   SUBJECTIVE STATEMENT: 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   He and wife are looking at facility to join.  Plan to build a pool at house.   PERTINENT HISTORY: A-fib Unilateral primary osteoarthritis, left knee  Primary osteoarthritis, right shoulder  Unilateral primary osteoarthritis, right knee  Radiculopathy, lumbar region PAIN:  Are you having pain? Yes: NPRS scale: 8/10 Pain location: bilat knee L>R, lower back Pain description: ache, sharp and annoying Aggravating factors: moving, standing x 53mnutes, walking with cane <10 Relieving factors: sitting, aleve  PRECAUTIONS: Knee, Cervical, Back, and Shoulder  WEIGHT BEARING RESTRICTIONS: No  FALLS:  Has patient fallen in last 6 months? Yes. Number of falls 3 from right knee giving  LIVING ENVIRONMENT: Lives with: lives with their family Lives in: House/apartment Stairs: No Has following equipment at home: Single point cane and WEnvironmental consultant- 2 wheeled  OCCUPATION: retired  PLOF: Independent with household mobility with device and Independent  with community mobility with device  PATIENT GOALS: decrease pain, decrease weight  NEXT MD VISIT: 11/17/22  OBJECTIVE:   DIAGNOSTIC FINDINGS: No recent imaging in chart  PATIENT SURVEYS:  FOTO Risk adjusted 49% with goal of 55% 12th visit  COGNITION: Overall cognitive status: Within functional limits for tasks assessed     SENSATION: Numbness in feet ~ 1 x month with lying   EDEMA:   occasional   POSTURE: flexed trunk   PALPATION: Unable to assess due to body habitus  LOWER EXTREMITY ROM:  Active ROM Right eval Left eval  Hip flexion    Hip extension    Hip abduction    Hip adduction    Hip internal rotation    Hip external rotation    Knee flexion 95 may be limited due to body habitus 102  Knee extension 0 possibly hyper mobile 0  Ankle dorsiflexion    Ankle plantarflexion    Ankle inversion    Ankle eversion     (Blank rows = not tested)  LOWER EXTREMITY MMT:  MMT Right eval Left eval  Hip flexion 52.9 57.8  Hip extension    Hip abduction 41.5 64.4  Hip adduction    Hip internal rotation    Hip external rotation    Knee flexion 63.5 62  Knee extension    Ankle dorsiflexion    Ankle plantarflexion    Ankle inversion    Ankle eversion     (Blank rows = not tested)  LOWER EXTREMITY SPECIAL TESTS:  Patellofemoral grind test: positive R and Left  FUNCTIONAL TESTS:  5 times sit to stand: 18.97 from massage table Timed up and go (TUG): to be assessed later  GAIT: Distance walked: 400 ft Assistive device utilized: Single point cane Level of assistance: Modified independence Comments: Pt using cane; lateral shift R and L with minimal knee and hip flex clearing feet from floor. Cadence slowed with increased time spent right and left in stance. Step length wnl   TODAY'S TREATMENT:                                                                                                                              Pt seen for aquatic therapy today.  Treatment took place in water 3.5-4.75 ft in depth at the [facility] pool. Temp of water was 86.  Pt entered/exited the pool via stairs with  bilat rail. * in 4 ft 6" : walking forward/backward/ side stepping * holding wall:  hip abdct/ addct 2 x 10; hip ext 2 x 10; heel raises x 15 * thin squoodle pull downs with TrA set 15 * return to walking backward/forward with arm swing * relaxed squats  at wall x 10 * sitting on thick squoodle holding corner:  cycling; cc ski, suspended jumping jack LEs * staggered stance with kick board row 2x 10 each LE * holding wall: hip openers/crosses x 10 each; L stretch  *  quad stretch with foot on 3rd step x 15s each LE   Pt requires the buoyancy and hydrostatic pressure of water for support, and to offload joints by unweighting joint load by at least 50 % in navel deep water and by at least 75-80% in chest to neck deep water.  Viscosity of the water is needed for resistance of strengthening. Water current perturbations provides challenge to standing balance requiring increased core activation.    PATIENT EDUCATION:  Education details: aquatic modifications  Person educated: Patient Education method: Customer service manager Education comprehension: verbalized understanding  HOME EXERCISE PROGRAM: Access Code: [email or web address] URL: [email or web address] Date: 11/06/2022 Prepared by: Denton Meek  Exercises - Supine Quad Set  - 1 x daily - 7 x weekly - 3 sets - 10 reps - Supine Heel Slide  - 1 x daily - 7 x weekly - 3 sets - 10 reps - Seated Long Arc Quad  - 1 x daily - 7 x weekly - 3 sets - 10 reps - Supine Active Straight Leg Raise  - 1 x daily - 7 x weekly - 3 sets - 10 reps  ASSESSMENT:  CLINICAL IMPRESSION: Pt reported reduction of pain in back and knees to 3-4/10 until attempting to cross under lap lane while sitting on squoodle, went back up to 6/10.  Knee pain decreased to 4/10 after suspended cycling  He will benefit from skilled physical therapy intervention to improve functional mobility, gait, strength, and toleration to activity. He is progressing towards established goals. Progressing towards goals. May try trial of tape to knee for support/ improved patella tracking.  OBJECTIVE IMPAIRMENTS: Abnormal gait, cardiopulmonary status limiting activity, decreased activity tolerance, decreased endurance, decreased  mobility, difficulty walking, decreased strength, obesity, and pain.   ACTIVITY LIMITATIONS: carrying, lifting, bending, sitting, standing, squatting, sleeping, stairs, and transfers  PARTICIPATION LIMITATIONS: cleaning, laundry, shopping, community activity, occupation, and yard work  PERSONAL FACTORS: Age, Behavior pattern, Fitness, and 3+ comorbidities: morbid obesity, a-fib, lumbar radiculopathy  are also affecting patient's functional outcome.   REHAB POTENTIAL: Good  CLINICAL DECISION MAKING: Unstable/unpredictable  EVALUATION COMPLEXITY: High   GOALS: Goals reviewed with patient? Yes  SHORT TERM GOALS: Target date: 12/04/22 Pt will tolerate full aquatic sessions consistently without increase in pain and with improving function to demonstrate good toleration and effectiveness of intervention.   Baseline:TBA Goal status: INITIAL  2.  Pt will tolerate stair climbing in and out of pool indep without difficulty Baseline: TBA Goal status: INITIAL  3.  Pt will improve on 5 X STS test to <or=  14s  to demonstrate improving functional lower extremity strength, transitional movements, and balance  Baseline: 18.97 from massage table Goal status: INITIAL    LONG TERM GOALS: Target date: 01/01/23  Pt to meet stated Foto Goal 55% Baseline: Risk adjusted 49%  Goal status: INITIAL  2.  Pt will be indep with final HEP's (land and aquatic as appropriate) for continued management of condition  Baseline: TBA Goal status: INITIAL  3.  Pt will report amb up towards 30 minutes without requiring seated rest period Baseline: <10 Goal status: INITIAL  4.  Pt will not have any episode of "R knee giving" causing fall. Baseline: 3 last 6 months Goal status: INITIAL  5.  Pt will have increase in strength R knee and hip by 10lbs Baseline: see chart Goal status: INITIAL     PLAN:  PT FREQUENCY: 1-2x/week  PT DURATION: 8 weeks  PLANNED INTERVENTIONS: Therapeutic exercises,  Therapeutic activity,  Neuromuscular re-education, Balance training, Gait training, Patient/Family education, Self Care, Joint mobilization, Joint manipulation, Stair training, Orthotic/Fit training, DME instructions, Aquatic Therapy, Dry Needling, Electrical stimulation, Cryotherapy, Moist heat, scar mobilization, Splintting, Taping, Traction, Ultrasound, Ionotophoresis '4mg'$ /ml Dexamethasone, Manual therapy, and Re-evaluation  PLAN FOR NEXT SESSION: Aquatics: strengthening LE, gait, balance, aerobic capacity, monitor sats and pulse as needed  Kerin Perna, PTA 11/28/22 4:52 PM [facility] [address] [place], [place], [address] Phone: [phone]   Fax:  [phone]

## 2022-12-03 ENCOUNTER — Ambulatory Visit (HOSPITAL_BASED_OUTPATIENT_CLINIC_OR_DEPARTMENT_OTHER): Payer: Commercial Managed Care - PPO | Admitting: Physical Therapy

## 2022-12-03 ENCOUNTER — Encounter (HOSPITAL_BASED_OUTPATIENT_CLINIC_OR_DEPARTMENT_OTHER): Payer: Self-pay | Admitting: Physical Therapy

## 2022-12-03 DIAGNOSIS — M6281 Muscle weakness (generalized): Secondary | ICD-10-CM

## 2022-12-03 DIAGNOSIS — M25562 Pain in left knee: Secondary | ICD-10-CM | POA: Diagnosis not present

## 2022-12-03 DIAGNOSIS — R262 Difficulty in walking, not elsewhere classified: Secondary | ICD-10-CM

## 2022-12-03 DIAGNOSIS — M25561 Pain in right knee: Secondary | ICD-10-CM | POA: Diagnosis not present

## 2022-12-03 DIAGNOSIS — G8929 Other chronic pain: Secondary | ICD-10-CM

## 2022-12-03 NOTE — Therapy (Signed)
OUTPATIENT PHYSICAL THERAPY LOWER EXTREMITY TREATMENT   Patient Name: Charles Barnett MRN: [id number] DOB:[birth year], 57 y.o., male Today's Date: 12/03/2022  END OF SESSION:  PT End of Session - 12/03/22 1453     Visit Number 7    Number of Visits 16    Date for PT Re-Evaluation 01/01/23    Authorization Type [place] aetna    PT Start Time 1445    PT Stop Time 1525    PT Time Calculation (min) 40 min    Behavior During Therapy [facility] for tasks assessed/performed             Past Medical History:  Diagnosis Date   Arthritis    Atrial flutter ([place]) 12/2016   Hypertension    Neuromuscular disorder ([place])    Sleep apnea    Past Surgical History:  Procedure Laterality Date   CARDIOVERSION N/A 01/30/2017   Procedure: CARDIOVERSION;  Surgeon: Josue Hector, MD;  Location: [facility] ENDOSCOPY;  Service: Cardiovascular;  Laterality: N/A;   CARDIOVERSION N/A 06/27/2021   Procedure: CARDIOVERSION;  Surgeon: Pixie Casino, MD;  Location: [facility] ENDOSCOPY;  Service: Cardiovascular;  Laterality: N/A;   CARDIOVERSION N/A 07/25/2021   Procedure: CARDIOVERSION;  Surgeon: Skeet Latch, MD;  Location: [place];  Service: Cardiovascular;  Laterality: N/A;   CARDIOVERSION N/A 05/09/2022   Procedure: CARDIOVERSION;  Surgeon: Fay Records, MD;  Location: [place];  Service: Cardiovascular;  Laterality: N/A;   SPINE SURGERY     Patient Active Problem List   Diagnosis Date Noted   Hyperparathyroidism, primary ([place]) 10/18/2017   Morbid obesity ([place]) 02/13/2017   High risk medication use 02/05/2017   Chronic pain syndrome 01/30/2017   Lumbosacral spondylosis 01/30/2017   Persistent atrial fibrillation ([place])    Hypercalcemia 01/09/2017   Atrial flutter ([place]) 01/07/2017   Left shoulder pain 10/18/2016   Right knee pain 10/18/2016   OSA (obstructive sleep apnea) 01/06/2014   Cannot sleep 10/01/2013   Body mass index (BMI) of 50-59.9 in adult ([place]) 06/25/2013   Postoperative back  pain 05/27/2013   Essential hypertension 05/27/2013   H/O disease 03/22/2013    PCP: Hayden Rasmussen, MD   REFERRING PROVIDER: Hayden Rasmussen, MD   REFERRING DIAG: Degenerative joint disease of bilateral knees   THERAPY DIAG:  Chronic pain of both knees  Difficulty in walking, not elsewhere classified  Muscle weakness (generalized)  Rationale for Evaluation and Treatment: Rehabilitation  ONSET DATE: >10 yrs  SUBJECTIVE:   SUBJECTIVE STATEMENT: Pt reports he took it easy this past weekend.  "Feeling not too bad today".  He reports that his Lt knee has been popping a lot lately; "it goes backward and when it snaps back forward it pops".   PERTINENT HISTORY: A-fib Unilateral primary osteoarthritis, left knee  Primary osteoarthritis, right shoulder  Unilateral primary osteoarthritis, right knee  Radiculopathy, lumbar region PAIN:  Are you having pain? Yes: NPRS scale: 7/10 Pain location: bilat knee L>R, lower back Pain description: ache, sharp and annoying Aggravating factors: moving, standing x 62minutes, walking with cane <10 Relieving factors: sitting, aleve  PRECAUTIONS: Knee, Cervical, Back, and Shoulder  WEIGHT BEARING RESTRICTIONS: No  FALLS:  Has patient fallen in last 6 months? Yes. Number of falls 3 from right knee giving  LIVING ENVIRONMENT: Lives with: lives with their family Lives in: House/apartment Stairs: No Has following equipment at home: Single point cane and Environmental consultant - 2 wheeled  OCCUPATION: retired  PLOF: Independent with household mobility with device  and Independent with community mobility with device  PATIENT GOALS: decrease pain, decrease weight  NEXT MD VISIT: 11/17/22  OBJECTIVE:   DIAGNOSTIC FINDINGS: No recent imaging in chart  PATIENT SURVEYS:  FOTO Risk adjusted 49% with goal of 55% 12th visit  COGNITION: Overall cognitive status: Within functional limits for tasks assessed     SENSATION: Numbness in feet ~ 1 x month  with lying   EDEMA:  occasional   POSTURE: flexed trunk   PALPATION: Unable to assess due to body habitus  LOWER EXTREMITY ROM:  Active ROM Right eval Left eval  Hip flexion    Hip extension    Hip abduction    Hip adduction    Hip internal rotation    Hip external rotation    Knee flexion 95 may be limited due to body habitus 102  Knee extension 0 possibly hyper mobile 0  Ankle dorsiflexion    Ankle plantarflexion    Ankle inversion    Ankle eversion     (Blank rows = not tested)  LOWER EXTREMITY MMT:  MMT Right eval Left eval  Hip flexion 52.9 57.8  Hip extension    Hip abduction 41.5 64.4  Hip adduction    Hip internal rotation    Hip external rotation    Knee flexion 63.5 62  Knee extension    Ankle dorsiflexion    Ankle plantarflexion    Ankle inversion    Ankle eversion     (Blank rows = not tested)  LOWER EXTREMITY SPECIAL TESTS:  Patellofemoral grind test: positive R and Left  FUNCTIONAL TESTS:  5 times sit to stand: 18.97 from massage table Timed up and go (TUG): to be assessed later  GAIT: Distance walked: 400 ft Assistive device utilized: Single point cane Level of assistance: Modified independence Comments: Pt using cane; lateral shift R and L with minimal knee and hip flex clearing feet from floor. Cadence slowed with increased time spent right and left in stance. Step length wnl   TODAY'S TREATMENT:                                                                                                                              Pt seen for aquatic therapy today.  Treatment took place in water 3.5-4.75 ft in depth at the [facility] pool. Temp of water was 88-91.  Pt entered/exited the pool via ladder and stairs with step-to pattern and bilat rail.  * in 33ft 6": sitting on thick squoodle holding corner:  cycling; cc ski, suspended jumping jack LEs * in 4 ft 6" : walking forward/backward/ side stepping (with arm addct with yellow hand  floats) * holding yellow  hand floats under water: marching in place   * holding yellow hand floats on surface:  hip abdct/ addct 2 x 10; hip flex/ext 2 x 10; heel raises x 20 * thin squoodle pull downs with TrA set 15 * return to walking  backward/forward with decreased step length, increased speed, no arm swing * squats pushing yellow hand float under water x 20 * TUG like exercise in water x 3 * quad stretch with foot on 3rd step x 15s x 2 each LE   * after dried off:  Reg Rock tape applied with 25% stretch to Lt lateral knee to tibial tuberosity and a perpendicular strip across fat pad (the x in tape covering his area of pain)   Pt requires the buoyancy and hydrostatic pressure of water for support, and to offload joints by unweighting joint load by at least 50 % in navel deep water and by at least 75-80% in chest to neck deep water.  Viscosity of the water is needed for resistance of strengthening. Water current perturbations provides challenge to standing balance requiring increased core activation.    PATIENT EDUCATION:  Education details: aquatic modifications  Person educated: Patient Education method: Customer service manager Education comprehension: verbalized understanding  HOME EXERCISE PROGRAM: Access Code: [email or web address] URL: [email or web address] Date: 11/06/2022 Prepared by: Denton Meek  Exercises - Supine Quad Set  - 1 x daily - 7 x weekly - 3 sets - 10 reps - Supine Heel Slide  - 1 x daily - 7 x weekly - 3 sets - 10 reps - Seated Long Arc Quad  - 1 x daily - 7 x weekly - 3 sets - 10 reps - Supine Active Straight Leg Raise  - 1 x daily - 7 x weekly - 3 sets - 10 reps  ASSESSMENT:  CLINICAL IMPRESSION: Pt reported reduction of pain in back and knees to 3-4/10 with suspended cycling, until climbing out of pool via ladder/ stairs.  He has met STG #1 and 2. Pt reporting he no longer has post-exercise soreness in the days to follow this therapy sessions  like he did when he first started. Trial of Rock tape applied to Lt knee for increased proprioception. Will assess response and trial another variation if helpful.   He will benefit from skilled physical therapy intervention to improve functional mobility, gait, strength, and toleration to activity. He is progressing towards established goals. Progressing towards remaining goals.    OBJECTIVE IMPAIRMENTS: Abnormal gait, cardiopulmonary status limiting activity, decreased activity tolerance, decreased endurance, decreased mobility, difficulty walking, decreased strength, obesity, and pain.   ACTIVITY LIMITATIONS: carrying, lifting, bending, sitting, standing, squatting, sleeping, stairs, and transfers  PARTICIPATION LIMITATIONS: cleaning, laundry, shopping, community activity, occupation, and yard work  PERSONAL FACTORS: Age, Behavior pattern, Fitness, and 3+ comorbidities: morbid obesity, a-fib, lumbar radiculopathy  are also affecting patient's functional outcome.   REHAB POTENTIAL: Good  CLINICAL DECISION MAKING: Unstable/unpredictable  EVALUATION COMPLEXITY: High   GOALS: Goals reviewed with patient? Yes  SHORT TERM GOALS: Target date: 12/04/22 Pt will tolerate full aquatic sessions consistently without increase in pain and with improving function to demonstrate good toleration and effectiveness of intervention.   Baseline:TBA Goal status:MET -12/03/22  2.  Pt will tolerate stair climbing in and out of pool indep without difficulty Baseline: TBA Goal status: MET -12/03/22  3.  Pt will improve on 5 X STS test to <or=  14s  to demonstrate improving functional lower extremity strength, transitional movements, and balance  Baseline: 18.97 from massage table Goal status: INITIAL    LONG TERM GOALS: Target date: 01/01/23  Pt to meet stated Foto Goal 55% Baseline: Risk adjusted 49%  Goal status: INITIAL  2.  Pt will be indep with final HEP's (land and aquatic as appropriate)  for  continued management of condition  Baseline: TBA Goal status: INITIAL  3.  Pt will report amb up towards 30 minutes without requiring seated rest period Baseline: <10 Goal status: INITIAL  4.  Pt will not have any episode of "R knee giving" causing fall. Baseline: 3 last 6 months Goal status: INITIAL  5.  Pt will have increase in strength R knee and hip by 10lbs Baseline: see chart Goal status: INITIAL     PLAN:  PT FREQUENCY: 1-2x/week  PT DURATION: 8 weeks  PLANNED INTERVENTIONS: Therapeutic exercises, Therapeutic activity, Neuromuscular re-education, Balance training, Gait training, Patient/Family education, Self Care, Joint mobilization, Joint manipulation, Stair training, Orthotic/Fit training, DME instructions, Aquatic Therapy, Dry Needling, Electrical stimulation, Cryotherapy, Moist heat, scar mobilization, Splintting, Taping, Traction, Ultrasound, Ionotophoresis 4mg /ml Dexamethasone, Manual therapy, and Re-evaluation  PLAN FOR NEXT SESSION: Aquatics: strengthening LE, gait, balance, aerobic capacity, monitor sats and pulse as needed  Kerin Perna, PTA 12/03/22 6:14 PM [facility] [address] [place], [place], [address] Phone: [phone]   Fax:  [phone]

## 2022-12-05 ENCOUNTER — Ambulatory Visit (HOSPITAL_BASED_OUTPATIENT_CLINIC_OR_DEPARTMENT_OTHER): Payer: Commercial Managed Care - PPO | Admitting: Physical Therapy

## 2022-12-05 DIAGNOSIS — M25561 Pain in right knee: Secondary | ICD-10-CM | POA: Diagnosis not present

## 2022-12-05 DIAGNOSIS — R262 Difficulty in walking, not elsewhere classified: Secondary | ICD-10-CM

## 2022-12-05 DIAGNOSIS — M6281 Muscle weakness (generalized): Secondary | ICD-10-CM

## 2022-12-05 DIAGNOSIS — G8929 Other chronic pain: Secondary | ICD-10-CM

## 2022-12-05 DIAGNOSIS — M25562 Pain in left knee: Secondary | ICD-10-CM | POA: Diagnosis not present

## 2022-12-05 NOTE — Therapy (Signed)
OUTPATIENT PHYSICAL THERAPY LOWER EXTREMITY TREATMENT   Patient Name: Charles Barnett MRN: [id number] DOB:1966-09-10, 57 y.o., male Today's Date: 12/06/2022  END OF SESSION:  PT End of Session - 12/06/22 0631     Visit Number 8    Number of Visits 16    Date for PT Re-Evaluation 01/01/23    Authorization Type [place] aetna    PT Start Time 0233    PT Stop Time 0311    PT Time Calculation (min) 38 min    Activity Tolerance Patient tolerated treatment well    Behavior During Therapy [facility] for tasks assessed/performed              Past Medical History:  Diagnosis Date   Arthritis    Atrial flutter ([place]) 12/2016   Hypertension    Neuromuscular disorder ([place])    Sleep apnea    Past Surgical History:  Procedure Laterality Date   CARDIOVERSION N/A 01/30/2017   Procedure: CARDIOVERSION;  Surgeon: Josue Hector, MD;  Location: [place];  Service: Cardiovascular;  Laterality: N/A;   CARDIOVERSION N/A 06/27/2021   Procedure: CARDIOVERSION;  Surgeon: Pixie Casino, MD;  Location: [place];  Service: Cardiovascular;  Laterality: N/A;   CARDIOVERSION N/A 07/25/2021   Procedure: CARDIOVERSION;  Surgeon: Skeet Latch, MD;  Location: [facility];  Service: Cardiovascular;  Laterality: N/A;   CARDIOVERSION N/A 05/09/2022   Procedure: CARDIOVERSION;  Surgeon: Fay Records, MD;  Location: [place];  Service: Cardiovascular;  Laterality: N/A;   SPINE SURGERY     Patient Active Problem List   Diagnosis Date Noted   Hyperparathyroidism, primary ([place]) 10/18/2017   Morbid obesity ([place]) 02/13/2017   High risk medication use 02/05/2017   Chronic pain syndrome 01/30/2017   Lumbosacral spondylosis 01/30/2017   Persistent atrial fibrillation ([place])    Hypercalcemia 01/09/2017   Atrial flutter ([place]) 01/07/2017   Left shoulder pain 10/18/2016   Right knee pain 10/18/2016   OSA (obstructive sleep apnea) 01/06/2014   Cannot sleep 10/01/2013   Body mass index (BMI) of  50-59.9 in adult ([place]) 06/25/2013   Postoperative back pain 05/27/2013   Essential hypertension 05/27/2013   H/O disease 03/22/2013    PCP: Hayden Rasmussen, MD   REFERRING PROVIDER: Hayden Rasmussen, MD   REFERRING DIAG: Degenerative joint disease of bilateral knees   THERAPY DIAG:  Chronic pain of both knees  Difficulty in walking, not elsewhere classified  Muscle weakness (generalized)  Rationale for Evaluation and Treatment: Rehabilitation  ONSET DATE: >10 yrs  SUBJECTIVE:   SUBJECTIVE STATEMENT: Patient stood while cooking earlier in the day. He reports significant pain today. He has been tolerating the pool well.  PERTINENT HISTORY: A-fib Unilateral primary osteoarthritis, left knee  Primary osteoarthritis, right shoulder  Unilateral primary osteoarthritis, right knee  Radiculopathy, lumbar region PAIN:  Are you having pain? Yes: NPRS scale: 8/10 Pain location: bilat knee L>R, left is worse, sharp, aching lower back Pain description: ache, sharp and annoying Aggravating factors: laying flat, moving, standing x 51minutes, walking with cane <5 mins Relieving factors: resting in a supine position and propping leg   PRECAUTIONS: Knee, Cervical, Back, and Shoulder  WEIGHT BEARING RESTRICTIONS: No  FALLS:  Has patient fallen in last 6 months? Yes. Number of falls 3 from right knee giving  out   LIVING ENVIRONMENT: Lives with: lives with their family Lives in: House/apartment Stairs: No Has following equipment at home: Single point cane and Environmental consultant - 2 wheeled  OCCUPATION: retired  PLOF: Independent with household mobility with device and Independent with community mobility with device  PATIENT GOALS: decrease pain, decrease weight  NEXT MD VISIT: 11/17/22  OBJECTIVE:   DIAGNOSTIC FINDINGS: No recent imaging in chart  PATIENT SURVEYS:  FOTO Risk adjusted 49% with goal of 55% 12th visit  COGNITION: Overall cognitive status: Within functional limits  for tasks assessed     SENSATION: Numbness in feet ~ 1 x month with lying   EDEMA:  occasional   POSTURE: flexed trunk   PALPATION: Unable to assess due to body habitus  LOWER EXTREMITY ROM:  Active ROM Right eval Left eval  Hip flexion    Hip extension    Hip abduction    Hip adduction    Hip internal rotation    Hip external rotation    Knee flexion 95 may be limited due to body habitus 102  Knee extension 0 possibly hyper mobile 0  Ankle dorsiflexion    Ankle plantarflexion    Ankle inversion    Ankle eversion     (Blank rows = not tested)  LOWER EXTREMITY MMT:  MMT Right eval Left eval  Hip flexion 52.9 57.8  Hip extension    Hip abduction 41.5 64.4  Hip adduction    Hip internal rotation    Hip external rotation    Knee flexion 63.5 62  Knee extension    Ankle dorsiflexion    Ankle plantarflexion    Ankle inversion    Ankle eversion     (Blank rows = not tested)  LOWER EXTREMITY SPECIAL TESTS:  Patellofemoral grind test: positive R and Left  FUNCTIONAL TESTS:  5 times sit to stand: 18.97 from massage table Timed up and go (TUG): to be assessed later  GAIT: Distance walked: 400 ft Assistive device utilized: Single point cane Level of assistance: Modified independence Comments: Pt using cane; lateral shift R and L with minimal knee and hip flex clearing feet from floor. Cadence slowed with increased time spent right and left in stance. Step length wnl   TODAY'S TREATMENT:                                                                                                                              3/22  Quad set 3x10  Supine ankle pumps 3/10 SAQ 3x10  LAQ 3/10 Seated heel raise 3x15  Ankle DF 3x10 Abduction green band 3x10       Last visit:  Pt seen for aquatic therapy today.  Treatment took place in water 3.5-4.75 ft in depth at the [facility] pool. Temp of water was 88-91.  Pt entered/exited the pool via ladder and stairs  with step-to pattern and bilat rail.  * in 34ft 6": sitting on thick squoodle holding corner:  cycling; cc ski, suspended jumping jack LEs * in 4 ft 6" : walking forward/backward/ side stepping (with arm addct with yellow hand floats) * holding yellow  hand floats  under water: marching in place   * holding yellow hand floats on surface:  hip abdct/ addct 2 x 10; hip flex/ext 2 x 10; heel raises x 20 * thin squoodle pull downs with TrA set 15 * return to walking backward/forward with decreased step length, increased speed, no arm swing * squats pushing yellow hand float under water x 20 * TUG like exercise in water x 3 * quad stretch with foot on 3rd step x 15s x 2 each LE   * after dried off:  Reg [place] tape applied with 25% stretch to Lt lateral knee to tibial tuberosity and a perpendicular strip across fat pad (the x in tape covering his area of pain)   Pt requires the buoyancy and hydrostatic pressure of water for support, and to offload joints by unweighting joint load by at least 50 % in navel deep water and by at least 75-80% in chest to neck deep water.  Viscosity of the water is needed for resistance of strengthening. Water current perturbations provides challenge to standing balance requiring increased core activation.    PATIENT EDUCATION:  Education details: aquatic modifications  Person educated: Patient Education method: Customer service manager Education comprehension: verbalized understanding  HOME EXERCISE PROGRAM: Access Code: [email or web address] URL: [email or web address] Date: 11/06/2022 Prepared by: Denton Meek  Exercises - Supine Quad Set  - 1 x daily - 7 x weekly - 3 sets - 10 reps - Supine Heel Slide  - 1 x daily - 7 x weekly - 3 sets - 10 reps - Seated Long Arc Quad  - 1 x daily - 7 x weekly - 3 sets - 10 reps - Supine Active Straight Leg Raise  - 1 x daily - 7 x weekly - 3 sets - 10 reps  ASSESSMENT:  CLINICAL IMPRESSION: Patient was limited by  pain today and difficulty lying supine because of his back. We reviewed supine exercises he can do in a semi-recumbent position and seated. He tolerated those without a significant increase in pain. He did have a high baseline level of pain. We will hopefully progress to more weight bearing exercises as he progresses.    OBJECTIVE IMPAIRMENTS: Abnormal gait, cardiopulmonary status limiting activity, decreased activity tolerance, decreased endurance, decreased mobility, difficulty walking, decreased strength, obesity, and pain.   ACTIVITY LIMITATIONS: carrying, lifting, bending, sitting, standing, squatting, sleeping, stairs, and transfers  PARTICIPATION LIMITATIONS: cleaning, laundry, shopping, community activity, occupation, and yard work  PERSONAL FACTORS: Age, Behavior pattern, Fitness, and 3+ comorbidities: morbid obesity, a-fib, lumbar radiculopathy  are also affecting patient's functional outcome.   REHAB POTENTIAL: Good  CLINICAL DECISION MAKING: Unstable/unpredictable  EVALUATION COMPLEXITY: High   GOALS: Goals reviewed with patient? Yes  SHORT TERM GOALS: Target date: 12/04/22 Pt will tolerate full aquatic sessions consistently without increase in pain and with improving function to demonstrate good toleration and effectiveness of intervention.   Baseline:TBA Goal status:MET -12/03/22  2.  Pt will tolerate stair climbing in and out of pool indep without difficulty Baseline: TBA Goal status: MET -12/03/22  3.  Pt will improve on 5 X STS test to <or=  14s  to demonstrate improving functional lower extremity strength, transitional movements, and balance  Baseline: 18.97 from massage table Goal status: INITIAL    LONG TERM GOALS: Target date: 01/01/23  Pt to meet stated Foto Goal 55% Baseline: Risk adjusted 49%  Goal status: INITIAL  2.  Pt will be indep with final HEP's (land and aquatic as appropriate) for continued management  of condition  Baseline: TBA Goal status:  INITIAL  3.  Pt will report amb up towards 30 minutes without requiring seated rest period Baseline: <10 Goal status: INITIAL  4.  Pt will not have any episode of "R knee giving" causing fall. Baseline: 3 last 6 months Goal status: INITIAL  5.  Pt will have increase in strength R knee and hip by 10lbs Baseline: see chart Goal status: INITIAL     PLAN:  PT FREQUENCY: 1-2x/week  PT DURATION: 8 weeks  PLANNED INTERVENTIONS: Therapeutic exercises, Therapeutic activity, Neuromuscular re-education, Balance training, Gait training, Patient/Family education, Self Care, Joint mobilization, Joint manipulation, Stair training, Orthotic/Fit training, DME instructions, Aquatic Therapy, Dry Needling, Electrical stimulation, Cryotherapy, Moist heat, scar mobilization, Splintting, Taping, Traction, Ultrasound, Ionotophoresis 4mg /ml Dexamethasone, Manual therapy, and Re-evaluation  PLAN FOR NEXT SESSION: Aquatics: strengthening LE, gait, balance, aerobic capacity, monitor sats and pulse as needed  Kerin Perna, PTA 12/06/22 10:44 AM [facility] [address] [place], [place], [address] Phone: [phone]   Fax:  [phone]

## 2022-12-06 ENCOUNTER — Encounter (HOSPITAL_BASED_OUTPATIENT_CLINIC_OR_DEPARTMENT_OTHER): Payer: Self-pay | Admitting: Physical Therapy

## 2022-12-08 NOTE — Therapy (Signed)
OUTPATIENT PHYSICAL THERAPY LOWER EXTREMITY TREATMENT   Patient Name: Charles Barnett MRN: [id number] DOB:10/21/1965, 57 y.o., male Today's Date: 12/10/2022  END OF SESSION:  PT End of Session - 12/09/22 1325     Visit Number 9    Number of Visits 16    Date for PT Re-Evaluation 01/01/23    Authorization Type [place] aetna    PT Start Time 1322    PT Stop Time 1356    PT Time Calculation (min) 34 min    Activity Tolerance Patient tolerated treatment well    Behavior During Therapy [facility] for tasks assessed/performed               Past Medical History:  Diagnosis Date   Arthritis    Atrial flutter ([place]) 12/2016   Hypertension    Neuromuscular disorder ([place])    Sleep apnea    Past Surgical History:  Procedure Laterality Date   CARDIOVERSION N/A 01/30/2017   Procedure: CARDIOVERSION;  Surgeon: Josue Hector, MD;  Location: [facility];  Service: Cardiovascular;  Laterality: N/A;   CARDIOVERSION N/A 06/27/2021   Procedure: CARDIOVERSION;  Surgeon: Pixie Casino, MD;  Location: [facility];  Service: Cardiovascular;  Laterality: N/A;   CARDIOVERSION N/A 07/25/2021   Procedure: CARDIOVERSION;  Surgeon: Skeet Latch, MD;  Location: [facility];  Service: Cardiovascular;  Laterality: N/A;   CARDIOVERSION N/A 05/09/2022   Procedure: CARDIOVERSION;  Surgeon: Fay Records, MD;  Location: [place];  Service: Cardiovascular;  Laterality: N/A;   SPINE SURGERY     Patient Active Problem List   Diagnosis Date Noted   Hyperparathyroidism, primary ([place]) 10/18/2017   Morbid obesity ([place]) 02/13/2017   High risk medication use 02/05/2017   Chronic pain syndrome 01/30/2017   Lumbosacral spondylosis 01/30/2017   Persistent atrial fibrillation ([place])    Hypercalcemia 01/09/2017   Atrial flutter ([place]) 01/07/2017   Left shoulder pain 10/18/2016   Right knee pain 10/18/2016   OSA (obstructive sleep apnea) 01/06/2014   Cannot sleep 10/01/2013   Body mass index (BMI)  of 50-59.9 in adult ([place]) 06/25/2013   Postoperative back pain 05/27/2013   Essential hypertension 05/27/2013   H/O disease 03/22/2013    PCP: Hayden Rasmussen, MD   REFERRING PROVIDER: Hayden Rasmussen, MD   REFERRING DIAG: Degenerative joint disease of bilateral knees   THERAPY DIAG:  Chronic pain of both knees  Difficulty in walking, not elsewhere classified  Muscle weakness (generalized)  Rationale for Evaluation and Treatment: Rehabilitation  ONSET DATE: >10 yrs  SUBJECTIVE:   SUBJECTIVE STATEMENT: Pt denies any adverse effects after prior Rx.  Pt states his knees feel good in the water and he can move better in the water.  Pt is limited with standing duration and ambulation distance.  He is unable to go shopping.  Pt states he can walk a little further since he started PT.  Pt reports he is compliant with HEP and has felt ok with performing exercises.      PERTINENT HISTORY: A-fib Unilateral primary osteoarthritis, left knee  Primary osteoarthritis, right shoulder  Unilateral primary osteoarthritis, right knee  Radiculopathy, lumbar region  Spinal cord stimulator in lumbar  PAIN:  Are you having pain? Yes: NPRS scale: 7/10 Pain location: bilat knee Pain description: aching, nagging  "a little bit of everything" Aggravating factors: laying flat, moving, standing x 35minutes, walking with cane <5 mins Relieving factors: resting in a supine position and propping leg   PRECAUTIONS: Knee, Cervical, Back,  and Shoulder  WEIGHT BEARING RESTRICTIONS: No  FALLS:  Has patient fallen in last 6 months? Yes. Number of falls 3 from right knee giving  out   LIVING ENVIRONMENT: Lives with: lives with their family Lives in: House/apartment Stairs: No Has following equipment at home: Single point cane and Environmental consultant - 2 wheeled  OCCUPATION: retired  PLOF: Independent with household mobility with device and Independent with community mobility with device  PATIENT GOALS:  decrease pain, decrease weight  NEXT MD VISIT: 11/17/22  OBJECTIVE:   DIAGNOSTIC FINDINGS: No recent imaging in chart  PATIENT SURVEYS:  FOTO Risk adjusted 49% with goal of 55% 12th visit  COGNITION: Overall cognitive status: Within functional limits for tasks assessed     SENSATION: Numbness in feet ~ 1 x month with lying   EDEMA:  occasional   POSTURE: flexed trunk   PALPATION: Unable to assess due to body habitus  LOWER EXTREMITY ROM:  Active ROM Right eval Left eval  Hip flexion    Hip extension    Hip abduction    Hip adduction    Hip internal rotation    Hip external rotation    Knee flexion 95 may be limited due to body habitus 102  Knee extension 0 possibly hyper mobile 0  Ankle dorsiflexion    Ankle plantarflexion    Ankle inversion    Ankle eversion     (Blank rows = not tested)  LOWER EXTREMITY MMT:  MMT Right eval Left eval  Hip flexion 52.9 57.8  Hip extension    Hip abduction 41.5 64.4  Hip adduction    Hip internal rotation    Hip external rotation    Knee flexion 63.5 62  Knee extension    Ankle dorsiflexion    Ankle plantarflexion    Ankle inversion    Ankle eversion     (Blank rows = not tested)  LOWER EXTREMITY SPECIAL TESTS:  Patellofemoral grind test: positive R and Left  FUNCTIONAL TESTS:  5 times sit to stand: 18.97 from massage table Timed up and go (TUG): to be assessed later  GAIT: Distance walked: 400 ft Assistive device utilized: Single point cane Level of assistance: Modified independence Comments: Pt using cane; lateral shift R and L with minimal knee and hip flex clearing feet from floor. Cadence slowed with increased time spent right and left in stance. Step length wnl   TODAY'S TREATMENT:                                                                                                                               Pt performed supine exercises on table with head elevated in a semi-recumbent position.     Quad set 2x10  SAQ 3x10  Supine heel slides 2x10 LAQ x10 AROM, 2x10 with 2.5# Seated heel raise 3x10  Seated Ankle DF 3x10 Abduction green band 3x10 Seated marching with GTB 3x10    PATIENT EDUCATION:  Education  details:  exercise form Person educated: Patient Education method: Explanation and Demonstration Education comprehension: verbalized understanding  HOME EXERCISE PROGRAM: Access Code: [email or web address] URL: [email or web address] Date: 11/06/2022 Prepared by: Denton Meek  Exercises - Supine Quad Set  - 1 x daily - 7 x weekly - 3 sets - 10 reps - Supine Heel Slide  - 1 x daily - 7 x weekly - 3 sets - 10 reps - Seated Long Arc Quad  - 1 x daily - 7 x weekly - 3 sets - 10 reps - Supine Active Straight Leg Raise  - 1 x daily - 7 x weekly - 3 sets - 10 reps  ASSESSMENT:  CLINICAL IMPRESSION: Pt performed table exercises in a semi-recumbent position and also seated on the table with good tolerance.  He performed exercises well with cuing and instruction in correct form.  Pt responded well to Rx stating his knees felt better after Rx and stated "they feel pretty good".  He reports improved pain from 7/10 before Rx to 5/10 after Rx.  Pt may benefit from continued skilled PT services consisting of land based and aquatic therapy.      OBJECTIVE IMPAIRMENTS: Abnormal gait, cardiopulmonary status limiting activity, decreased activity tolerance, decreased endurance, decreased mobility, difficulty walking, decreased strength, obesity, and pain.   ACTIVITY LIMITATIONS: carrying, lifting, bending, sitting, standing, squatting, sleeping, stairs, and transfers  PARTICIPATION LIMITATIONS: cleaning, laundry, shopping, community activity, occupation, and yard work  PERSONAL FACTORS: Age, Behavior pattern, Fitness, and 3+ comorbidities: morbid obesity, a-fib, lumbar radiculopathy  are also affecting patient's functional outcome.   REHAB POTENTIAL: Good  CLINICAL DECISION  MAKING: Unstable/unpredictable  EVALUATION COMPLEXITY: High   GOALS: Goals reviewed with patient? Yes  SHORT TERM GOALS: Target date: 12/04/22 Pt will tolerate full aquatic sessions consistently without increase in pain and with improving function to demonstrate good toleration and effectiveness of intervention.   Baseline:TBA Goal status:MET -12/03/22  2.  Pt will tolerate stair climbing in and out of pool indep without difficulty Baseline: TBA Goal status: MET -12/03/22  3.  Pt will improve on 5 X STS test to <or=  14s  to demonstrate improving functional lower extremity strength, transitional movements, and balance  Baseline: 18.97 from massage table Goal status: INITIAL    LONG TERM GOALS: Target date: 01/01/23  Pt to meet stated [place]oto Goal 55% Baseline: Risk adjusted 49%  Goal status: INITIAL  2.  Pt will be indep with final HEP's (land and aquatic as appropriate) for continued management of condition  Baseline: TBA Goal status: INITIAL  3.  Pt will report amb up towards 30 minutes without requiring seated rest period Baseline: <10 Goal status: INITIAL  4.  Pt will not have any episode of "R knee giving" causing fall. Baseline: 3 last 6 months Goal status: INITIAL  5.  Pt will have increase in strength R knee and hip by 10lbs Baseline: see chart Goal status: INITIAL     PLAN:  PT FREQUENCY: 1-2x/week  PT DURATION: 8 weeks  PLANNED INTERVENTIONS: Therapeutic exercises, Therapeutic activity, Neuromuscular re-education, Balance training, Gait training, Patient/Family education, Self Care, Joint mobilization, Joint manipulation, Stair training, Orthotic/Fit training, DME instructions, Aquatic Therapy, Dry Needling, Electrical stimulation, Cryotherapy, Moist heat, scar mobilization, Splintting, Taping, Traction, Ultrasound, Ionotophoresis 4mg /ml Dexamethasone, Manual therapy, and Re-evaluation  PLAN FOR NEXT SESSION: Aquatics: strengthening LE, gait, balance,  aerobic capacity, monitor sats and pulse as needed  Selinda Michaels III PT, DPT 12/10/22 2:40 PM

## 2022-12-09 ENCOUNTER — Ambulatory Visit (HOSPITAL_BASED_OUTPATIENT_CLINIC_OR_DEPARTMENT_OTHER): Payer: Commercial Managed Care - PPO | Admitting: Physical Therapy

## 2022-12-09 DIAGNOSIS — R262 Difficulty in walking, not elsewhere classified: Secondary | ICD-10-CM

## 2022-12-09 DIAGNOSIS — M6281 Muscle weakness (generalized): Secondary | ICD-10-CM

## 2022-12-09 DIAGNOSIS — M25562 Pain in left knee: Secondary | ICD-10-CM | POA: Diagnosis not present

## 2022-12-09 DIAGNOSIS — G8929 Other chronic pain: Secondary | ICD-10-CM

## 2022-12-09 DIAGNOSIS — M25561 Pain in right knee: Secondary | ICD-10-CM | POA: Diagnosis not present

## 2022-12-10 ENCOUNTER — Encounter (HOSPITAL_BASED_OUTPATIENT_CLINIC_OR_DEPARTMENT_OTHER): Payer: Self-pay | Admitting: Physical Therapy

## 2022-12-10 NOTE — Therapy (Signed)
OUTPATIENT PHYSICAL THERAPY LOWER EXTREMITY TREATMENT Progress Note Reporting Period 11/06/22 to 3/27  See note below for Objective Data and Assessment of Progress/Goals.      Patient Name: Charles Barnett MRN: [id number] DOB:1965/09/27, 57 y.o., male Today's Date: 12/11/2022  END OF SESSION:  PT End of Session - 12/11/22 1850     Visit Number 10    Number of Visits 16    Date for PT Re-Evaluation 01/01/23    Authorization Type [place] aetna    Progress Note Due on Visit 20    PT Start Time 1416    PT Stop Time 1500    PT Time Calculation (min) 44 min    Activity Tolerance Patient tolerated treatment well    Behavior During Therapy [facility] for tasks assessed/performed               Past Medical History:  Diagnosis Date   Arthritis    Atrial flutter ([place]) 12/2016   Hypertension    Neuromuscular disorder ([place])    Sleep apnea    Past Surgical History:  Procedure Laterality Date   CARDIOVERSION N/A 01/30/2017   Procedure: CARDIOVERSION;  Surgeon: Josue Hector, MD;  Location: [facility] ENDOSCOPY;  Service: Cardiovascular;  Laterality: N/A;   CARDIOVERSION N/A 06/27/2021   Procedure: CARDIOVERSION;  Surgeon: Pixie Casino, MD;  Location: [place];  Service: Cardiovascular;  Laterality: N/A;   CARDIOVERSION N/A 07/25/2021   Procedure: CARDIOVERSION;  Surgeon: Skeet Latch, MD;  Location: [place];  Service: Cardiovascular;  Laterality: N/A;   CARDIOVERSION N/A 05/09/2022   Procedure: CARDIOVERSION;  Surgeon: Fay Records, MD;  Location: [place];  Service: Cardiovascular;  Laterality: N/A;   SPINE SURGERY     Patient Active Problem List   Diagnosis Date Noted   Hyperparathyroidism, primary ([place]) 10/18/2017   Morbid obesity ([place]) 02/13/2017   High risk medication use 02/05/2017   Chronic pain syndrome 01/30/2017   Lumbosacral spondylosis 01/30/2017   Persistent atrial fibrillation ([place])    Hypercalcemia 01/09/2017   Atrial flutter ([place]) 01/07/2017    Left shoulder pain 10/18/2016   Right knee pain 10/18/2016   OSA (obstructive sleep apnea) 01/06/2014   Cannot sleep 10/01/2013   Body mass index (BMI) of 50-59.9 in adult ([place]) 06/25/2013   Postoperative back pain 05/27/2013   Essential hypertension 05/27/2013   H/O disease 03/22/2013    PCP: Hayden Rasmussen, MD   REFERRING PROVIDER: Hayden Rasmussen, MD   REFERRING DIAG: Degenerative joint disease of bilateral knees   THERAPY DIAG:  Chronic pain of both knees  Difficulty in walking, not elsewhere classified  Muscle weakness (generalized)  Rationale for Evaluation and Treatment: Rehabilitation  ONSET DATE: >10 yrs  SUBJECTIVE:   SUBJECTIVE STATEMENT: Pt reports good pin relief with aquatic intervention until he gets back to his car. Mild improvement ib pain overall.      PERTINENT HISTORY: A-fib Unilateral primary osteoarthritis, left knee  Primary osteoarthritis, right shoulder  Unilateral primary osteoarthritis, right knee  Radiculopathy, lumbar region  Spinal cord stimulator in lumbar  PAIN:  Are you having pain? Yes: NPRS scale: 8/10 Pain location: bilat knee Pain description: aching, nagging  "a little bit of everything" Aggravating factors: laying flat, moving, standing x 56minutes, walking with cane <5 mins Relieving factors: resting in a supine position and propping leg   PRECAUTIONS: Knee, Cervical, Back, and Shoulder  WEIGHT BEARING RESTRICTIONS: No  FALLS:  Has patient fallen in last 6 months? Yes. Number of falls  3 from right knee giving  out   LIVING ENVIRONMENT: Lives with: lives with their family Lives in: House/apartment Stairs: No Has following equipment at home: Single point cane and Environmental consultant - 2 wheeled  OCCUPATION: retired  PLOF: Independent with household mobility with device and Independent with community mobility with device  PATIENT GOALS: decrease pain, decrease weight  NEXT MD VISIT: 11/17/22  OBJECTIVE:   DIAGNOSTIC  FINDINGS: No recent imaging in chart  PATIENT SURVEYS:  FOTO Risk adjusted 49% primary measure 43 % with goal of 55% 12th visit 10th visit 12/11/22: 47%  COGNITION: Overall cognitive status: Within functional limits for tasks assessed     SENSATION: Numbness in feet ~ 1 x month with lying   EDEMA:  occasional   POSTURE: flexed trunk   PALPATION: Unable to assess due to body habitus  LOWER EXTREMITY ROM:  Active ROM Right eval Left eval  Hip flexion    Hip extension    Hip abduction    Hip adduction    Hip internal rotation    Hip external rotation    Knee flexion 95 may be limited due to body habitus 102  Knee extension 0 possibly hyper mobile 0  Ankle dorsiflexion    Ankle plantarflexion    Ankle inversion    Ankle eversion     (Blank rows = not tested)  LOWER EXTREMITY MMT:  MMT Right eval Left eval Right / Left 12/11/22  Hip flexion 52.9 57.8   Hip extension     Hip abduction 41.5 64.4 49.0 / 64.2  Hip adduction     Hip internal rotation     Hip external rotation     Knee flexion 63.5 62 68.0 / 64.3  Knee extension     Ankle dorsiflexion     Ankle plantarflexion     Ankle inversion     Ankle eversion      (Blank rows = not tested)  LOWER EXTREMITY SPECIAL TESTS:  Patellofemoral grind test: positive R and Left  FUNCTIONAL TESTS:  5 times sit to stand: 18.97 from massage table Timed up and go (TUG): to be assessed later /completed  12/11/22 5 x sts 11.70 TUG: 9.50  GAIT: Distance walked: 400 ft Assistive device utilized: Single point cane Level of assistance: Modified independence Comments: Pt using cane; lateral shift R and L with minimal knee and hip flex clearing feet from floor. Cadence slowed with increased time spent right and left in stance. Step length wnl   TODAY'S TREATMENT:                                                                                                                              Objective testing  Pt seen for  aquatic therapy today.  Treatment took place in water 3.5-4.75 ft in depth at the [facility] pool. Temp of water was 88-91.  Pt entered/exited the pool via ladder and stairs with step-to pattern and bilat  rail.   *wallking forward and back 1/2 length pool *Side lunges with yellow HB ue add/abd 1/2 length pool R/L * squats pushing yellow HB under water 3x 10 * yellow HB  pull downs with TrA set x10 wide stance then staggered stance * holding yellow  hand floats under water for core engagement 2 1/2 lengths ea. Cues for abdominal bracing for improved posture  * holding yellow hand floats on surface for balance:  High knee marching slow for prolonged SLS; 3way tap; hip flex/ext   Pt requires the buoyancy and hydrostatic pressure of water for support, and to offload joints by unweighting joint load by at least 50 % in navel deep water and by at least 75-80% in chest to neck deep water.  Viscosity of the water is needed for resistance of strengthening. Water current perturbations provides challenge to standing balance requiring increased core activation. Pt performed supine exercises on table with head elevated in a semi-recumbent position.    Previous: Quad set 2x10  SAQ 3x10  Supine heel slides 2x10 LAQ x10 AROM, 2x10 with 2.5# Seated heel raise 3x10  Seated Ankle DF 3x10 Abduction green band 3x10 Seated marching with GTB 3x10    PATIENT EDUCATION:  Education details:  exercise form Person educated: Patient Education method: Customer service manager Education comprehension: verbalized understanding  HOME EXERCISE PROGRAM: Access Code: [email or web address] URL: [email or web address] Date: 11/06/2022 Prepared by: Denton Meek  Exercises - Supine Quad Set  - 1 x daily - 7 x weekly - 3 sets - 10 reps - Supine Heel Slide  - 1 x daily - 7 x weekly - 3 sets - 10 reps - Seated Long Arc Quad  - 1 x daily - 7 x weekly - 3 sets - 10 reps - Supine Active Straight Leg  Raise  - 1 x daily - 7 x weekly - 3 sets - 10 reps  ASSESSMENT:  CLINICAL IMPRESSION: PN: Pt reporting improvements in pain with skilled physical therapy intervention. His functional testing demonstrates improvements in balance, gait speed and transfers. Strength improved as per chart above. He does continue to have knee pain on daily basis with max pain not improved.  He does report improvement with standing and amb tolerance. His foto goal almost met with improvement to 47%. Pt has been participating in both aquatic and land based sessions. He will continue to benefit form skilled physical therapy intervention with anticipation of meeting all goal by tentative end of cert.          OBJECTIVE IMPAIRMENTS: Abnormal gait, cardiopulmonary status limiting activity, decreased activity tolerance, decreased endurance, decreased mobility, difficulty walking, decreased strength, obesity, and pain.   ACTIVITY LIMITATIONS: carrying, lifting, bending, sitting, standing, squatting, sleeping, stairs, and transfers  PARTICIPATION LIMITATIONS: cleaning, laundry, shopping, community activity, occupation, and yard work  PERSONAL FACTORS: Age, Behavior pattern, Fitness, and 3+ comorbidities: morbid obesity, a-fib, lumbar radiculopathy  are also affecting patient's functional outcome.   REHAB POTENTIAL: Good  CLINICAL DECISION MAKING: Unstable/unpredictable  EVALUATION COMPLEXITY: High   GOALS: Goals reviewed with patient? Yes  SHORT TERM GOALS: Target date: 12/04/22 Pt will tolerate full aquatic sessions consistently without increase in pain and with improving function to demonstrate good toleration and effectiveness of intervention.   Baseline:TBA Goal status:MET -12/03/22  2.  Pt will tolerate stair climbing in and out of pool indep without difficulty Baseline: TBA Goal status: MET -12/03/22  3.  Pt will improve on 5 X STS test to <or=  14s  to  demonstrate improving functional lower extremity  strength, transitional movements, and balance  Baseline: 18.97 from massage table 11.70: 12/11/22 Goal status: Met 12/11/22    LONG TERM GOALS: Target date: 01/01/23  Pt to meet stated Foto Goal 55% Baseline: Risk adjusted 49% primary measure 43%; 12/11/22 47% Goal status: ongoing 12/11/22  2.  Pt will be indep with final HEP's (land and aquatic as appropriate) for continued management of condition  Baseline: TBA Goal status: ongoing 12/11/22  3.  Pt will report amb up towards 30 minutes without requiring seated rest period Baseline: <10 Goal status: INITIAL  4.  Pt will not have any episode of "R knee giving" causing fall. Baseline: 3 last 6 months; continues to "give" but no falls Goal status: Ongoing 12/11/22  5.  Pt will have increase in strength R knee and hip by 10lbs Baseline: see chart Goal status: Ongoing 12/11/22     PLAN:  PT FREQUENCY: 1-2x/week  PT DURATION: 8 weeks  PLANNED INTERVENTIONS: Therapeutic exercises, Therapeutic activity, Neuromuscular re-education, Balance training, Gait training, Patient/Family education, Self Care, Joint mobilization, Joint manipulation, Stair training, Orthotic/Fit training, DME instructions, Aquatic Therapy, Dry Needling, Electrical stimulation, Cryotherapy, Moist heat, scar mobilization, Splintting, Taping, Traction, Ultrasound, Ionotophoresis 4mg /ml Dexamethasone, Manual therapy, and Re-evaluation  PLAN FOR NEXT SESSION: Aquatics: strengthening LE, gait, balance, aerobic capacity, monitor sats and pulse as needed  Stanton Kidney Tharon Aquas) Lester Platas MPT 12/11/22 6:52 PM

## 2022-12-11 ENCOUNTER — Encounter (HOSPITAL_BASED_OUTPATIENT_CLINIC_OR_DEPARTMENT_OTHER): Payer: Self-pay | Admitting: Physical Therapy

## 2022-12-11 ENCOUNTER — Ambulatory Visit (HOSPITAL_BASED_OUTPATIENT_CLINIC_OR_DEPARTMENT_OTHER): Payer: Commercial Managed Care - PPO | Admitting: Physical Therapy

## 2022-12-11 DIAGNOSIS — M25561 Pain in right knee: Secondary | ICD-10-CM | POA: Diagnosis not present

## 2022-12-11 DIAGNOSIS — G8929 Other chronic pain: Secondary | ICD-10-CM | POA: Diagnosis not present

## 2022-12-11 DIAGNOSIS — R262 Difficulty in walking, not elsewhere classified: Secondary | ICD-10-CM | POA: Diagnosis not present

## 2022-12-11 DIAGNOSIS — M25562 Pain in left knee: Secondary | ICD-10-CM | POA: Diagnosis not present

## 2022-12-11 DIAGNOSIS — M6281 Muscle weakness (generalized): Secondary | ICD-10-CM

## 2022-12-13 DIAGNOSIS — G4733 Obstructive sleep apnea (adult) (pediatric): Secondary | ICD-10-CM | POA: Diagnosis not present

## 2022-12-13 DIAGNOSIS — R0902 Hypoxemia: Secondary | ICD-10-CM | POA: Diagnosis not present

## 2022-12-17 ENCOUNTER — Ambulatory Visit (HOSPITAL_BASED_OUTPATIENT_CLINIC_OR_DEPARTMENT_OTHER): Payer: Commercial Managed Care - PPO | Attending: Family Medicine | Admitting: Physical Therapy

## 2022-12-17 ENCOUNTER — Encounter (HOSPITAL_BASED_OUTPATIENT_CLINIC_OR_DEPARTMENT_OTHER): Payer: Self-pay | Admitting: Physical Therapy

## 2022-12-17 DIAGNOSIS — M25561 Pain in right knee: Secondary | ICD-10-CM | POA: Insufficient documentation

## 2022-12-17 DIAGNOSIS — M6281 Muscle weakness (generalized): Secondary | ICD-10-CM | POA: Diagnosis not present

## 2022-12-17 DIAGNOSIS — M25562 Pain in left knee: Secondary | ICD-10-CM | POA: Insufficient documentation

## 2022-12-17 DIAGNOSIS — R262 Difficulty in walking, not elsewhere classified: Secondary | ICD-10-CM | POA: Diagnosis not present

## 2022-12-17 DIAGNOSIS — G8929 Other chronic pain: Secondary | ICD-10-CM

## 2022-12-17 NOTE — Therapy (Signed)
OUTPATIENT PHYSICAL THERAPY LOWER EXTREMITY TREATMENT    Patient Name: Charles Barnett MRN: [id number] DOB:[birth year], 57 y.o., male Today's Date: 12/17/2022  END OF SESSION:  PT End of Session - 12/17/22 1457     Visit Number 11    Number of Visits 16    Date for PT Re-Evaluation 01/01/23    Authorization Type [place] aetna    Progress Note Due on Visit 20    PT Start Time 1445    PT Stop Time 1525    PT Time Calculation (min) 40 min    Behavior During Therapy [facility] for tasks assessed/performed               Past Medical History:  Diagnosis Date   Arthritis    Atrial flutter 12/2016   Hypertension    Neuromuscular disorder    Sleep apnea    Past Surgical History:  Procedure Laterality Date   CARDIOVERSION N/A 01/30/2017   Procedure: CARDIOVERSION;  Surgeon: Josue Hector, MD;  Location: [place];  Service: Cardiovascular;  Laterality: N/A;   CARDIOVERSION N/A 06/27/2021   Procedure: CARDIOVERSION;  Surgeon: Pixie Casino, MD;  Location: [facility] ENDOSCOPY;  Service: Cardiovascular;  Laterality: N/A;   CARDIOVERSION N/A 07/25/2021   Procedure: CARDIOVERSION;  Surgeon: Skeet Latch, MD;  Location: [place];  Service: Cardiovascular;  Laterality: N/A;   CARDIOVERSION N/A 05/09/2022   Procedure: CARDIOVERSION;  Surgeon: Fay Records, MD;  Location: [facility] ENDOSCOPY;  Service: Cardiovascular;  Laterality: N/A;   SPINE SURGERY     Patient Active Problem List   Diagnosis Date Noted   Hyperparathyroidism, primary 10/18/2017   Morbid obesity 02/13/2017   High risk medication use 02/05/2017   Chronic pain syndrome 01/30/2017   Lumbosacral spondylosis 01/30/2017   Persistent atrial fibrillation    Hypercalcemia 01/09/2017   Atrial flutter 01/07/2017   Left shoulder pain 10/18/2016   Right knee pain 10/18/2016   OSA (obstructive sleep apnea) 01/06/2014   Cannot sleep 10/01/2013   Body mass index (BMI) of 50-59.9 in adult 06/25/2013   Postoperative back pain  05/27/2013   Essential hypertension 05/27/2013   H/O disease 03/22/2013    PCP: Hayden Rasmussen, MD   REFERRING PROVIDER: Hayden Rasmussen, MD   REFERRING DIAG: Degenerative joint disease of bilateral knees   THERAPY DIAG:  Chronic pain of both knees  Difficulty in walking, not elsewhere classified  Muscle weakness (generalized)  Rationale for Evaluation and Treatment: Rehabilitation  ONSET DATE: >10 yrs  SUBJECTIVE:   SUBJECTIVE STATEMENT: Pt reports that Sunday evening he had Lt ankle pain that suddenly came on.  He reports he has been limping since then.  He hasn't gotten it checked out yet. He reports he did well after last aquatic visit.   PERTINENT HISTORY: A-fib Unilateral primary osteoarthritis, left knee  Primary osteoarthritis, right shoulder  Unilateral primary osteoarthritis, right knee  Radiculopathy, lumbar region  Spinal cord stimulator in lumbar  PAIN:  Are you having pain? Yes: NPRS scale: 7/10 back/ knees;  10/10 Lt ant ankle and heel Pain location: see above  Pain description: aching, nagging ; ankle = sharp Aggravating factors: laying flat, moving, standing x 77minutes, walking with cane <5 mins Relieving factors: resting in a supine position and propping leg   PRECAUTIONS: Knee, Cervical, Back, and Shoulder  WEIGHT BEARING RESTRICTIONS: No  FALLS:  Has patient fallen in last 6 months? Yes. Number of falls 3 from right knee giving  out   LIVING ENVIRONMENT:  Lives with: lives with their family Lives in: House/apartment Stairs: No Has following equipment at home: Single point cane and Environmental consultant - 2 wheeled  OCCUPATION: retired  PLOF: Independent with household mobility with device and Independent with community mobility with device  PATIENT GOALS: decrease pain, decrease weight  NEXT MD VISIT: 11/17/22  OBJECTIVE:   DIAGNOSTIC FINDINGS: No recent imaging in chart  PATIENT SURVEYS:  FOTO Risk adjusted 49% primary measure 43 % with  goal of 55% 12th visit 10th visit 12/11/22: 47%  COGNITION: Overall cognitive status: Within functional limits for tasks assessed     SENSATION: Numbness in feet ~ 1 x month with lying   EDEMA:  occasional   POSTURE: flexed trunk   PALPATION: Unable to assess due to body habitus  LOWER EXTREMITY ROM:  Active ROM Right eval Left eval  Hip flexion    Hip extension    Hip abduction    Hip adduction    Hip internal rotation    Hip external rotation    Knee flexion 95 may be limited due to body habitus 102  Knee extension 0 possibly hyper mobile 0  Ankle dorsiflexion    Ankle plantarflexion    Ankle inversion    Ankle eversion     (Blank rows = not tested)  LOWER EXTREMITY MMT:  MMT Right eval Left eval Right / Left 12/11/22  Hip flexion 52.9 57.8   Hip extension     Hip abduction 41.5 64.4 49.0 / 64.2  Hip adduction     Hip internal rotation     Hip external rotation     Knee flexion 63.5 62 68.0 / 64.3  Knee extension     Ankle dorsiflexion     Ankle plantarflexion     Ankle inversion     Ankle eversion      (Blank rows = not tested)  LOWER EXTREMITY SPECIAL TESTS:  Patellofemoral grind test: positive R and Left  FUNCTIONAL TESTS:  5 times sit to stand: 18.97 from massage table Timed up and go (TUG): to be assessed later /completed  12/11/22 5 x sts 11.70 TUG: 9.50  GAIT: Distance walked: 400 ft Assistive device utilized: Single point cane Level of assistance: Modified independence Comments: Pt using cane; lateral shift R and L with minimal knee and hip flex clearing feet from floor. Cadence slowed with increased time spent right and left in stance. Step length wnl   TODAY'S TREATMENT:                                                                                                                              Pt seen for aquatic therapy today.  Treatment took place in water 3.5-4.75 ft in depth at the [facility] pool. Temp of water was  91.  Pt entered/exited the pool via ladder and stairs with step-to pattern and bilat rail.   *wallking forward and backwards without support, marching  * Lt ankle  circles for ROM * side stepping R/L (moving to the R is painful- returned to forward) * walking forward/ backward pushing kick board 1/2 submerged;  then stagged stance with kick board row 10 x 2 * TrA set with solid noodle pull down x 10; repeated with pull down to alternating knees * SLS ( unable to tolerate LLE- stopped after 3 sec) * standard stance with reciprocal arm swing with blue resistance bells; repeated with bilat shoulder horiz abdct/ addct with core engaged * repeated with pink bells with increased speed in staggered stance * Box step R/L x 8 each  * hamstring / adductor stretch with foot on 2nd/3rd step and BUE on rails * bilat calf stretch with bilat heels off step x 10s  Pt requires the buoyancy and hydrostatic pressure of water for support, and to offload joints by unweighting joint load by at least 50 % in navel deep water and by at least 75-80% in chest to neck deep water.  Viscosity of the water is needed for resistance of strengthening. Water current perturbations provides challenge to standing balance requiring increased core activation. Pt performed supine exercises on table with head elevated in a semi-recumbent position.    Previous land: Metallurgist 2x10  SAQ 3x10  Supine heel slides 2x10 LAQ x10 AROM, 2x10 with 2.5# Seated heel raise 3x10  Seated Ankle DF 3x10 Abduction green band 3x10 Seated marching with GTB 3x10    PATIENT EDUCATION:  Education details:  exercise form Person educated: Patient Education method: Customer service manager Education comprehension: verbalized understanding  HOME EXERCISE PROGRAM: Access Code: [email or web address] URL: [email or web address] Date: 11/06/2022 Prepared by: Denton Meek  Exercises - Supine Quad Set  - 1 x daily - 7 x weekly - 3 sets - 10  reps - Supine Heel Slide  - 1 x daily - 7 x weekly - 3 sets - 10 reps - Seated Long Arc Quad  - 1 x daily - 7 x weekly - 3 sets - 10 reps - Supine Active Straight Leg Raise  - 1 x daily - 7 x weekly - 3 sets - 10 reps  ASSESSMENT:  CLINICAL IMPRESSION: Pt has had a new occurrence of pain; now in Lt ankle.  Limited tolerance for Lt SLS, Lt backward stepping (if step length too big), and side stepping to the Rt.  Overall, pain decreased to 6/10 in back, knees and ankle by end of session.  Encouraged pt to have ankle checked out if pain persists.   He will continue to benefit form skilled physical therapy intervention.  Progressing gradually towards remaining goals.         OBJECTIVE IMPAIRMENTS: Abnormal gait, cardiopulmonary status limiting activity, decreased activity tolerance, decreased endurance, decreased mobility, difficulty walking, decreased strength, obesity, and pain.   ACTIVITY LIMITATIONS: carrying, lifting, bending, sitting, standing, squatting, sleeping, stairs, and transfers  PARTICIPATION LIMITATIONS: cleaning, laundry, shopping, community activity, occupation, and yard work  PERSONAL FACTORS: Age, Behavior pattern, Fitness, and 3+ comorbidities: morbid obesity, a-fib, lumbar radiculopathy  are also affecting patient's functional outcome.   REHAB POTENTIAL: Good  CLINICAL DECISION MAKING: Unstable/unpredictable  EVALUATION COMPLEXITY: High   GOALS: Goals reviewed with patient? Yes  SHORT TERM GOALS: Target date: 12/04/22 Pt will tolerate full aquatic sessions consistently without increase in pain and with improving function to demonstrate good toleration and effectiveness of intervention.   Baseline:TBA Goal status:MET -12/03/22  2.  Pt will tolerate stair climbing in and out of pool indep without difficulty Baseline: TBA  Goal status: MET -12/03/22  3.  Pt will improve on 5 X STS test to <or=  14s  to demonstrate improving functional lower extremity strength,  transitional movements, and balance  Baseline: 18.97 from massage table 11.70: 12/11/22 Goal status: MET 12/11/22    LONG TERM GOALS: Target date: 01/01/23  Pt to meet stated Foto Goal 55% Baseline: Risk adjusted 49% primary measure 43%; 12/11/22 47% Goal status: ongoing 12/11/22  2.  Pt will be indep with final HEP's (land and aquatic as appropriate) for continued management of condition  Baseline: TBA Goal status: ongoing 12/11/22  3.  Pt will report amb up towards 30 minutes without requiring seated rest period Baseline: <10 Goal status: INITIAL  4.  Pt will not have any episode of "R knee giving" causing fall. Baseline: 3 last 6 months; continues to "give" but no falls Goal status: Ongoing 12/11/22  5.  Pt will have increase in strength R knee and hip by 10lbs Baseline: see chart Goal status: Ongoing 12/11/22     PLAN:  PT FREQUENCY: 1-2x/week  PT DURATION: 8 weeks  PLANNED INTERVENTIONS: Therapeutic exercises, Therapeutic activity, Neuromuscular re-education, Balance training, Gait training, Patient/Family education, Self Care, Joint mobilization, Joint manipulation, Stair training, Orthotic/Fit training, DME instructions, Aquatic Therapy, Dry Needling, Electrical stimulation, Cryotherapy, Moist heat, scar mobilization, Splintting, Taping, Traction, Ultrasound, Ionotophoresis 4mg /ml Dexamethasone, Manual therapy, and Re-evaluation  PLAN FOR NEXT SESSION: Aquatics: strengthening LE, gait, balance, aerobic capacity  Kerin Perna, PTA 12/17/22 4:30 PM [facility] [address] [place], [place], [address] Phone: [phone]   Fax:  [phone]

## 2022-12-19 ENCOUNTER — Ambulatory Visit (HOSPITAL_BASED_OUTPATIENT_CLINIC_OR_DEPARTMENT_OTHER): Payer: Commercial Managed Care - PPO | Admitting: Physical Therapy

## 2022-12-19 ENCOUNTER — Ambulatory Visit
Admission: EM | Admit: 2022-12-19 | Discharge: 2022-12-19 | Disposition: A | Discharge status: Home / Self Care | Payer: Commercial Managed Care - PPO | Attending: Urgent Care | Admitting: Urgent Care

## 2022-12-19 DIAGNOSIS — M79672 Pain in left foot: Secondary | ICD-10-CM | POA: Diagnosis not present

## 2022-12-19 DIAGNOSIS — I4891 Unspecified atrial fibrillation: Secondary | ICD-10-CM | POA: Diagnosis not present

## 2022-12-19 DIAGNOSIS — M7989 Other specified soft tissue disorders: Secondary | ICD-10-CM | POA: Diagnosis not present

## 2022-12-19 MED ORDER — COLCHICINE 0.6 MG PO TABS: ORAL_TABLET | ORAL | 0 refills | Status: DC

## 2022-12-19 NOTE — ED Triage Notes (Addendum)
Pt c/o left foot pain/swelling that started 3/31 when he stood and started walking-denies injury-pt with slow limping gait with own cane/DOE-states is his baseline

## 2022-12-19 NOTE — ED Provider Notes (Signed)
[facility]  Note:  This document was prepared using Systems analyst and may include unintentional dictation errors.  MRN: [id number] DOB: 22-Jun-1966  Subjective:   Charles Barnett is a 57 y.o. male presenting for 5-day history of acute onset persistent left foot pain, swelling.  Symptoms started while he was sitting.  They have been significant, severe.  Hurts worse when he starts to walk.  No history of gout.  He does have a history of essential hypertension, takes amlodipine and metoprolol.  Has a history of atrial fibrillation and atrial flutter, takes Xarelto and dronedarone.  No trauma, fall, bruising.  No fever.  No current facility-administered medications for this encounter.  Current Outpatient Medications:    albuterol (VENTOLIN HFA) 108 (90 Base) MCG/ACT inhaler, Inhale 1 puff by mouth every 4 hours as needed, Disp: 6.7 g, Rfl: 0   amLODipine (NORVASC) 5 MG tablet, Take 1 & 1/2 tablets (7.5 mg total) by mouth daily., Disp: 45 tablet, Rfl: 3   baclofen (LIORESAL) 10 MG tablet, Take 10 mg by mouth 3 (three) times daily as needed for muscle spasms., Disp: , Rfl:    Budeson-Glycopyrrol-Formoterol (BREZTRI AEROSPHERE) 160-9-4.8 MCG/ACT AERO, Inhale 2 puffs into the lungs 2 (two) times daily. (Patient taking differently: Inhale 2 puffs into the lungs 2 (two) times daily as needed (respiratory issues.).), Disp: 10.7 g, Rfl: 0   Budeson-Glycopyrrol-Formoterol (BREZTRI AEROSPHERE) 160-9-4.8 MCG/ACT AERO, Take 2 puffs by mouth 2 (two) times daily., Disp: 10.7 g, Rfl: 0   dronedarone (MULTAQ) 400 MG tablet, Take 1 tablet (400 mg total) by mouth 2 (two) times daily with a meal., Disp: 60 tablet, Rfl: 6   ergocalciferol (VITAMIN D2) 1.25 MG (50000 UT) capsule, Take 2 capsules (100,000 Units total) by mouth 2 (two) times a week., Disp: 24 capsule, Rfl: 3   ipratropium-albuterol (DUONEB) 0.5-2.5 (3) MG/3ML SOLN, use 3 ml via nebulizer every 6 hours as  needed, Disp: 90 mL, Rfl: 11   metoprolol tartrate (LOPRESSOR) 50 MG tablet, Take 1 tablet (50 mg total) by mouth 2 (two) times daily with food., Disp: 180 tablet, Rfl: 2   montelukast (SINGULAIR) 10 MG tablet, Take 1 tablet (10 mg total) by mouth every evening., Disp: 90 tablet, Rfl: 0   rivaroxaban (XARELTO) 20 MG TABS tablet, Take 1 tablet (20 mg total) by mouth daily with food., Disp: 90 tablet, Rfl: 3   Allergies  Allergen Reactions   Ibuprofen Other (See Comments)    Bloody stool   Iodine Swelling    Including contrast dye     Past Medical History:  Diagnosis Date   Arthritis    Atrial flutter 12/2016   Hypertension    Neuromuscular disorder    Sleep apnea      Past Surgical History:  Procedure Laterality Date   CARDIOVERSION N/A 01/30/2017   Procedure: CARDIOVERSION;  Surgeon: Josue Hector, MD;  Location: [facility] ENDOSCOPY;  Service: Cardiovascular;  Laterality: N/A;   CARDIOVERSION N/A 06/27/2021   Procedure: CARDIOVERSION;  Surgeon: Pixie Casino, MD;  Location: [facility] ENDOSCOPY;  Service: Cardiovascular;  Laterality: N/A;   CARDIOVERSION N/A 07/25/2021   Procedure: CARDIOVERSION;  Surgeon: Skeet Latch, MD;  Location: [facility] ENDOSCOPY;  Service: Cardiovascular;  Laterality: N/A;   CARDIOVERSION N/A 05/09/2022   Procedure: CARDIOVERSION;  Surgeon: Fay Records, MD;  Location: [facility] ENDOSCOPY;  Service: Cardiovascular;  Laterality: N/A;   SPINE SURGERY      Family History  Problem Relation Age of  Onset   Diabetes Mother    Hyperlipidemia Mother    Hypertension Mother    Heart disease Mother    Diabetes Sister    Hypertension Sister    Heart disease Sister    Hyperparathyroidism Neg Hx     Social History   Tobacco Use   Smoking status: Never   Smokeless tobacco: Former    Types: Chew   Tobacco comments:    Former Loss adjuster, chartered Tobacco 07/18/2021  Vaping Use   Vaping Use: Never used  Substance Use Topics   Alcohol use: No    Alcohol/week: 0.0 standard drinks of  alcohol   Drug use: No    ROS   Objective:   Vitals: BP 135/73 (BP Location: Left Arm)   Pulse 72   Temp 98.2 F (36.8 C) (Oral)   Resp (!) 28   SpO2 93%   Physical Exam Constitutional:      General: He is not in acute distress.    Appearance: Normal appearance. He is well-developed and normal weight. He is not ill-appearing, toxic-appearing or diaphoretic.  HENT:     Head: Normocephalic and atraumatic.     Right Ear: External ear normal.     Left Ear: External ear normal.     Nose: Nose normal.     Mouth/Throat:     Pharynx: Oropharynx is clear.  Eyes:     General: No scleral icterus.       Right eye: No discharge.        Left eye: No discharge.     Extraocular Movements: Extraocular movements intact.  Cardiovascular:     Rate and Rhythm: Normal rate.  Pulmonary:     Effort: Pulmonary effort is normal.  Musculoskeletal:     Cervical back: Normal range of motion.       Legs:  Neurological:     Mental Status: He is alert and oriented to person, place, and time.  Psychiatric:        Mood and Affect: Mood normal.        Behavior: Behavior normal.        Thought Content: Thought content normal.        Judgment: Judgment normal.     Assessment and Plan :   PDMP not reviewed this encounter.  1. Left foot pain   2. Swelling of left foot   3. Atrial fibrillation, unspecified type     Have a high suspicion for gout given overall presentation.  Will avoid antibiotics given lack of fever.  Recommended colchicine as the most reasonable option to help a suspected gout flare.  Will avoid prednisone given the atrial flutter, atrial fibrillation.  Follow-up with PCP closely.  Consider amlodipine as a possible source of peripheral edema.  Counseled patient on potential for adverse effects with medications prescribed/recommended today, ER and return-to-clinic precautions discussed, patient verbalized understanding.    Jaynee Eagles, [place] 12/19/22 1955

## 2022-12-19 NOTE — Discharge Instructions (Signed)
Please go ahead and start colchicine to help with your pain and inflammation.  I have a high suspicion that this is likely related to gout.  Recommend that we revisit this with your primary care provider.  You may also have to discuss whether or not you should continue taking amlodipine as it can cause swelling in the feet and legs.

## 2022-12-20 ENCOUNTER — Other Ambulatory Visit (HOSPITAL_BASED_OUTPATIENT_CLINIC_OR_DEPARTMENT_OTHER): Payer: Self-pay

## 2022-12-20 MED ORDER — IPRATROPIUM-ALBUTEROL 0.5-2.5 (3) MG/3ML IN SOLN
3.0000 mL | Freq: Four times a day (QID) | RESPIRATORY_TRACT | 0 refills | Status: AC | PRN
  Filled 2022-12-20: qty 90, 8d supply, fill #0

## 2022-12-24 ENCOUNTER — Ambulatory Visit (HOSPITAL_BASED_OUTPATIENT_CLINIC_OR_DEPARTMENT_OTHER): Payer: Commercial Managed Care - PPO | Admitting: Physical Therapy

## 2022-12-24 ENCOUNTER — Encounter (HOSPITAL_BASED_OUTPATIENT_CLINIC_OR_DEPARTMENT_OTHER): Payer: Self-pay | Admitting: Physical Therapy

## 2022-12-24 DIAGNOSIS — R262 Difficulty in walking, not elsewhere classified: Secondary | ICD-10-CM | POA: Diagnosis not present

## 2022-12-24 DIAGNOSIS — M6281 Muscle weakness (generalized): Secondary | ICD-10-CM

## 2022-12-24 DIAGNOSIS — M25562 Pain in left knee: Secondary | ICD-10-CM | POA: Diagnosis not present

## 2022-12-24 DIAGNOSIS — G8929 Other chronic pain: Secondary | ICD-10-CM

## 2022-12-24 DIAGNOSIS — M25561 Pain in right knee: Secondary | ICD-10-CM | POA: Diagnosis not present

## 2022-12-24 NOTE — Therapy (Signed)
OUTPATIENT PHYSICAL THERAPY LOWER EXTREMITY TREATMENT    Patient Name: Charles Barnett MRN: [id number] DOB:Apr 06, 1966, 57 y.o., male Today's Date: 12/24/2022  END OF SESSION:  PT End of Session - 12/24/22 1452     Visit Number 12    Number of Visits 16    Date for PT Re-Evaluation 01/01/23    Authorization Type [employer]    Progress Note Due on Visit 20    PT Start Time 1447    PT Stop Time 1525    PT Time Calculation (min) 38 min    Activity Tolerance Patient tolerated treatment well    Behavior During Therapy [facility] for tasks assessed/performed               Past Medical History:  Diagnosis Date   Arthritis    Atrial flutter 12/2016   Hypertension    Neuromuscular disorder    Sleep apnea    Past Surgical History:  Procedure Laterality Date   CARDIOVERSION N/A 01/30/2017   Procedure: CARDIOVERSION;  Surgeon: Wendall Stade, MD;  Location: [facility] ENDOSCOPY;  Service: Cardiovascular;  Laterality: N/A;   CARDIOVERSION N/A 06/27/2021   Procedure: CARDIOVERSION;  Surgeon: Chrystie Nose, MD;  Location: [facility] ENDOSCOPY;  Service: Cardiovascular;  Laterality: N/A;   CARDIOVERSION N/A 07/25/2021   Procedure: CARDIOVERSION;  Surgeon: Chilton Si, MD;  Location: [facility] ENDOSCOPY;  Service: Cardiovascular;  Laterality: N/A;   CARDIOVERSION N/A 05/09/2022   Procedure: CARDIOVERSION;  Surgeon: Pricilla Riffle, MD;  Location: [facility] ENDOSCOPY;  Service: Cardiovascular;  Laterality: N/A;   SPINE SURGERY     Patient Active Problem List   Diagnosis Date Noted   Hyperparathyroidism, primary 10/18/2017   Morbid obesity 02/13/2017   High risk medication use 02/05/2017   Chronic pain syndrome 01/30/2017   Lumbosacral spondylosis 01/30/2017   Persistent atrial fibrillation    Hypercalcemia 01/09/2017   Atrial flutter 01/07/2017   Left shoulder pain 10/18/2016   Right knee pain 10/18/2016   OSA (obstructive sleep apnea) 01/06/2014   Cannot sleep 10/01/2013   Body mass index (BMI) of  50-59.9 in adult 06/25/2013   Postoperative back pain 05/27/2013   Essential hypertension 05/27/2013   H/O disease 03/22/2013    PCP: Dois Davenport, MD   REFERRING PROVIDER: Dois Davenport, MD   REFERRING DIAG: Degenerative joint disease of bilateral knees   THERAPY DIAG:  Chronic pain of both knees  Difficulty in walking, not elsewhere classified  Muscle weakness (generalized)  Rationale for Evaluation and Treatment: Rehabilitation  ONSET DATE: >10 yrs  SUBJECTIVE:   SUBJECTIVE STATEMENT: Pt reports he went to walk in clinic and was given medicine for gout (Lt foot). He reports that he has follow up with primary care on Thurs and will have xray of L foot then.  He reports he is more flexible since starting PT; can don/doff shoes with less difficulty.    PERTINENT HISTORY: A-fib Unilateral primary osteoarthritis, left knee  Primary osteoarthritis, right shoulder  Unilateral primary osteoarthritis, right knee  Radiculopathy, lumbar region  Spinal cord stimulator in lumbar  PAIN:  Are you having pain? Yes: NPRS scale: 7/10 back/ knees;  10/10 Lt ant ankle and heel Pain location: see above  Pain description: aching, nagging ; ankle = sharp Aggravating factors: laying flat, moving, standing x , walking with cane <5 mins Relieving factors: resting in a supine position and propping leg   PRECAUTIONS: Knee, Cervical, Back, and Shoulder  WEIGHT BEARING RESTRICTIONS: No  FALLS:  Has patient fallen in last 6 months? Yes. Number of falls 3 from right knee giving  out   LIVING ENVIRONMENT: Lives with: lives with their family Lives in: House/apartment Stairs: No Has following equipment at home: Single point cane and Environmental consultantWalker - 2 wheeled  OCCUPATION: retired  PLOF: Independent with household mobility with device and Independent with community mobility with device  PATIENT GOALS: decrease pain, decrease weight  NEXT MD VISIT: 11/17/22  OBJECTIVE:    DIAGNOSTIC FINDINGS: No recent imaging in chart  PATIENT SURVEYS:  FOTO Risk adjusted 49% primary measure 43 % with goal of 55% 12th visit 10th visit 12/11/22: 47%  COGNITION: Overall cognitive status: Within functional limits for tasks assessed     SENSATION: Numbness in feet ~ 1 x month with lying   EDEMA:  occasional   POSTURE: flexed trunk   PALPATION: Unable to assess due to body habitus  LOWER EXTREMITY ROM:  Active ROM Right eval Left eval  Hip flexion    Hip extension    Hip abduction    Hip adduction    Hip internal rotation    Hip external rotation    Knee flexion 95 may be limited due to body habitus 102  Knee extension 0 possibly hyper mobile 0  Ankle dorsiflexion    Ankle plantarflexion    Ankle inversion    Ankle eversion     (Blank rows = not tested)  LOWER EXTREMITY MMT:  MMT Right eval Left eval Right / Left 12/11/22  Hip flexion 52.9 57.8   Hip extension     Hip abduction 41.5 64.4 49.0 / 64.2  Hip adduction     Hip internal rotation     Hip external rotation     Knee flexion 63.5 62 68.0 / 64.3  Knee extension     Ankle dorsiflexion     Ankle plantarflexion     Ankle inversion     Ankle eversion      (Blank rows = not tested)  LOWER EXTREMITY SPECIAL TESTS:  Patellofemoral grind test: positive R and Left  FUNCTIONAL TESTS:  5 times sit to stand: 18.97 from massage table Timed up and go (TUG): to be assessed later /completed  12/11/22 5 x sts 11.70 TUG: 9.50  GAIT: Distance walked: 400 ft Assistive device utilized: Single point cane Level of assistance: Modified independence Comments: Pt using cane; lateral shift R and L with minimal knee and hip flex clearing feet from floor. Cadence slowed with increased time spent right and left in stance. Step length wnl   TODAY'S TREATMENT:                                                                                                                              Pt seen for  aquatic therapy today.  Treatment took place in water 3.5-4.75 ft in depth at the [facility] pool. Temp of water was 91.  Pt entered/exited the pool via  ladder and stairs with step-to pattern and bilat rail.   *wallking forward and backwards without support, side stepping * side stepping with shoulder addct with yellow hand floats  * forward/backard marching with yellow hand floats under water at side * squats with yellow hand float x 20 * staggered stance with horiz abdct/add with pink resistance bells  * standard stance with quick/ slow reciprocal arm swing with resistance bells * forward step with single arm row with blue resistance bell x10 each x 2 * STS and side step down the bench with eccentric lowering (4 sec), and 8 STS in place.  * TrA set with solid noodle pull down x 10; repeated with pull down to alternating knees * bilat calf stretch with bilat heels off step x 10s x 2  Pt requires the buoyancy and hydrostatic pressure of water for support, and to offload joints by unweighting joint load by at least 50 % in navel deep water and by at least 75-80% in chest to neck deep water.  Viscosity of the water is needed for resistance of strengthening. Water current perturbations provides challenge to standing balance requiring increased core activation. Pt performed supine exercises on table with head elevated in a semi-recumbent position.    Previous land: IT consultant 2x10  SAQ 3x10  Supine heel slides 2x10 LAQ x10 AROM, 2x10 with 2.5# Seated heel raise 3x10  Seated Ankle DF 3x10 Abduction green band 3x10 Seated marching with GTB 3x10    PATIENT EDUCATION:  Education details:  exercise form Person educated: Patient Education method: Medical illustrator Education comprehension: verbalized understanding  HOME EXERCISE PROGRAM: Access Code: [email or web address] URL: [email or web address] Date: 11/06/2022 Prepared by: Geni Bers  Exercises - Supine  Quad Set  - 1 x daily - 7 x weekly - 3 sets - 10 reps - Supine Heel Slide  - 1 x daily - 7 x weekly - 3 sets - 10 reps - Seated Long Arc Quad  - 1 x daily - 7 x weekly - 3 sets - 10 reps - Supine Active Straight Leg Raise  - 1 x daily - 7 x weekly - 3 sets - 10 reps  ASSESSMENT:  CLINICAL IMPRESSION: Pt continues with new occurrence of painin Lt ankle/foot.  Cues given to limit step length backwards and side stepping.  Overall, pain decreased to 4/10 in back, knees by end of session; Lt ankle/foot pain remained elevated.  He will continue to benefit form skilled physical therapy intervention.  Progressing gradually towards remaining goals. Next visit is reassessment for recert vs d/c.         OBJECTIVE IMPAIRMENTS: Abnormal gait, cardiopulmonary status limiting activity, decreased activity tolerance, decreased endurance, decreased mobility, difficulty walking, decreased strength, obesity, and pain.   ACTIVITY LIMITATIONS: carrying, lifting, bending, sitting, standing, squatting, sleeping, stairs, and transfers  PARTICIPATION LIMITATIONS: cleaning, laundry, shopping, community activity, occupation, and yard work  PERSONAL FACTORS: Age, Behavior pattern, Fitness, and 3+ comorbidities: morbid obesity, a-fib, lumbar radiculopathy  are also affecting patient's functional outcome.   REHAB POTENTIAL: Good  CLINICAL DECISION MAKING: Unstable/unpredictable  EVALUATION COMPLEXITY: High   GOALS: Goals reviewed with patient? Yes  SHORT TERM GOALS: Target date: 12/04/22 Pt will tolerate full aquatic sessions consistently without increase in pain and with improving function to demonstrate good toleration and effectiveness of intervention.   Baseline:TBA Goal status:MET -12/03/22  2.  Pt will tolerate stair climbing in and out of pool indep without difficulty Baseline: TBA Goal status: MET -12/03/22  3.  Pt will improve on 5 X STS test to <or=  14s  to demonstrate improving functional lower  extremity strength, transitional movements, and balance  Baseline: 18.97 from massage table 11.70: 12/11/22 Goal status: MET 12/11/22    LONG TERM GOALS: Target date: 01/01/23  Pt to meet stated Foto Goal 55% Baseline: Risk adjusted 49% primary measure 43%; 12/11/22 47% Goal status: ongoing 12/11/22  2.  Pt will be indep with final HEP's (land and aquatic as appropriate) for continued management of condition  Baseline: TBA Goal status: ongoing 12/11/22  3.  Pt will report amb up towards 30 minutes without requiring seated rest period Baseline: <10 Goal status: INITIAL  4.  Pt will not have any episode of "R knee giving" causing fall. Baseline: 3 last 6 months; continues to "give" but no falls Goal status: Ongoing 12/11/22  5.  Pt will have increase in strength R knee and hip by 10lbs Baseline: see chart Goal status: Ongoing 12/11/22     PLAN:  PT FREQUENCY: 1-2x/week  PT DURATION: 8 weeks  PLANNED INTERVENTIONS: Therapeutic exercises, Therapeutic activity, Neuromuscular re-education, Balance training, Gait training, Patient/Family education, Self Care, Joint mobilization, Joint manipulation, Stair training, Orthotic/Fit training, DME instructions, Aquatic Therapy, Dry Needling, Electrical stimulation, Cryotherapy, Moist heat, scar mobilization, Splintting, Taping, Traction, Ultrasound, Ionotophoresis 4mg /ml Dexamethasone, Manual therapy, and Re-evaluation  PLAN FOR NEXT SESSION: Aquatics: strengthening LE, gait, balance, aerobic capacity  Mayer Camel, PTA 12/24/22 3:30 PM [facility] [address] [employer] [place] [employer], [place], [address] Phone: [phone]   Fax:  [phone]

## 2022-12-24 NOTE — Therapy (Deleted)
OUTPATIENT PHYSICAL THERAPY LOWER EXTREMITY TREATMENT    Patient Name: Charles Barnett MRN: [id number] DOB:01/15/1966, 56 y.o., male Today's Date: 12/17/2022  END OF SESSION:  PT End of Session - 12/17/22 1457     Visit Number 11    Number of Visits 16    Date for PT Re-Evaluation 01/01/23    Authorization Type [employer]et[employer]    Progress Note Due on Visit 20    PT Start Time 1445    PT Stop Time 1525    PT Time Calculation (min) 40 min    Behavior During Therapy WFL for tasks assessed/performed               Past Medical History:  Diagnosis Date   Arthritis    Atrial flutter 12/2016   Hypertension    Neuromuscular disorder    Sleep apnea    Past Surgical History:  Procedure Laterality Date   CARDIOVERSION N/A 01/30/2017   Procedure: CARDIOVERSION;  Surgeon: Nishan, Peter C, MD;  Location: [facility];  Service: Cardiovascular;  Laterality: N/A;   CARDIOVERSION N/A 06/27/2021   Procedure: CARDIOVERSION;  Surgeon: Hilty, Kenneth C, MD;  Location: [facility];  Service: Cardiovascular;  Laterality: N/A;   CARDIOVERSION N/A 07/25/2021   Procedure: CARDIOVERSION;  Surgeon: Andrew, Tiffany, MD;  Location: [facility];  Service: Cardiovascular;  Laterality: N/A;   CARDIOVERSION N/A 05/09/2022   Procedure: CARDIOVERSION;  Surgeon: Ross, Paula V, MD;  Location: [facility];  Service: Cardiovascular;  Laterality: N/A;   SPINE SURGERY     Patient Active Problem List   Diagnosis Date Noted   Hyperparathyroidism, primary 10/18/2017   Morbid obesity 02/13/2017   High risk medication use 02/05/2017   Chronic pain syndrome 01/30/2017   Lumbosacral spondylosis 01/30/2017   Persistent atrial fibrillation    Hypercalcemia 01/09/2017   Atrial flutter 01/07/2017   Left shoulder pain 10/18/2016   Right knee pain 10/18/2016   OSA (obstructive sleep apnea) 01/06/2014   Cannot sleep 10/01/2013   Body mass index (BMI) of 50-59.9 in adult 06/25/2013   Postoperative back pain  05/27/2013   Essential hypertension 05/27/2013   H/O disease 03/22/2013    PCP: Richter, Karen L, MD   REFERRING PROVIDER: Richter, Karen L, MD   REFERRING DIAG: Degenerative joint disease of bilateral knees   THERAPY DIAG:  Chronic pain of both knees  Difficulty in walking, not elsewhere classified  Muscle weakness (generalized)  Rationale for Evaluation and Treatment: Rehabilitation  ONSET DATE: >10 yrs  SUBJECTIVE:   SUBJECTIVE STATEMENT: Pt reports that Sunday evening he had Lt ankle pain that suddenly came on.  He reports he has been limping since then.  He hasn't gotten it checked out yet. He reports he did well after last aquatic visit.   PERTINENT HISTORY: A-fib Unilateral primary osteoarthritis, left knee  Primary osteoarthritis, right shoulder  Unilateral primary osteoarthritis, right knee  Radiculopathy, lumbar region  Spinal cord stimulator in lumbar  PAIN:  Are you having pain? Yes: NPRS scale: 7/10 back/ knees;  10/10 Lt ant ankle and heel Pain location: see above  Pain description: aching, nagging ; ankle = sharp Aggravating factors: laying flat, moving, standing x 10minutes, walking with cane <5 mins Relieving factors: resting in a supine position and propping leg   PRECAUTIONS: Knee, Cervical, Back, and Shoulder  WEIGHT BEARING RESTRICTIONS: No  FALLS:  Has patient fallen in last 6 months? Yes. Number of falls 3 from right knee giving  out   LIVING ENVIRONMENT:   Lives with: lives with their family Lives in: House/apartment Stairs: No Has following equipment at home: Single point cane and Walker - 2 wheeled  OCCUPATION: retired  PLOF: Independent with household mobility with device and Independent with community mobility with device  PATIENT GOALS: decrease pain, decrease weight  NEXT MD VISIT: 11/17/22  OBJECTIVE:   DIAGNOSTIC FINDINGS: No recent imaging in chart  PATIENT SURVEYS:  FOTO Risk adjusted 49% primary measure 43 % with  goal of 55% 12th visit 10th visit 12/11/22: 47%  COGNITION: Overall cognitive status: Within functional limits for tasks assessed     SENSATION: Numbness in feet ~ 1 x month with lying   EDEMA:  occasional   POSTURE: flexed trunk   PALPATION: Unable to assess due to body habitus  LOWER EXTREMITY ROM:  Active ROM Right eval Left eval  Hip flexion    Hip extension    Hip abduction    Hip adduction    Hip internal rotation    Hip external rotation    Knee flexion 95 may be limited due to body habitus 102  Knee extension 0 possibly hyper mobile 0  Ankle dorsiflexion    Ankle plantarflexion    Ankle inversion    Ankle eversion     (Blank rows = not tested)  LOWER EXTREMITY MMT:  MMT Right eval Left eval Right / Left 12/11/22  Hip flexion 52.9 57.8   Hip extension     Hip abduction 41.5 64.4 49.0 / 64.2  Hip adduction     Hip internal rotation     Hip external rotation     Knee flexion 63.5 62 68.0 / 64.3  Knee extension     Ankle dorsiflexion     Ankle plantarflexion     Ankle inversion     Ankle eversion      (Blank rows = not tested)  LOWER EXTREMITY SPECIAL TESTS:  Patellofemoral grind test: positive R and Left  FUNCTIONAL TESTS:  5 times sit to stand: 18.97 from massage table Timed up and go (TUG): to be assessed later /completed  12/11/22 5 x sts 11.70 TUG: 9.50  GAIT: Distance walked: 400 ft Assistive device utilized: Single point cane Level of assistance: Modified independence Comments: Pt using cane; lateral shift R and L with minimal knee and hip flex clearing feet from floor. Cadence slowed with increased time spent right and left in stance. Step length wnl   TODAY'S TREATMENT:                                                                                                                              Pt seen for aquatic therapy today.  Treatment took place in water 3.5-4.75 ft in depth at the [facility] pool. Temp of water was  91.  Pt entered/exited the pool via ladder and stairs with step-to pattern and bilat rail.   *wallking forward and backwards without support, marching  * Lt ankle   circles for ROM * side stepping R/L (moving to the R is painful- returned to forward) * walking forward/ backward pushing kick board 1/2 submerged;  then stagged stance with kick board row 10 x 2 * TrA set with solid noodle pull down x 10; repeated with pull down to alternating knees * SLS ( unable to tolerate LLE- stopped after 3 sec) * standard stance with reciprocal arm swing with blue resistance bells; repeated with bilat shoulder horiz abdct/ addct with core engaged * repeated with pink bells with increased speed in staggered stance * Box step R/L x 8 each  * hamstring / adductor stretch with foot on 2nd/3rd step and BUE on rails * bilat calf stretch with bilat heels off step x 10s  Pt requires the buoyancy and hydrostatic pressure of water for support, and to offload joints by unweighting joint load by at least 50 % in navel deep water and by at least 75-80% in chest to neck deep water.  Viscosity of the water is needed for resistance of strengthening. Water current perturbations provides challenge to standing balance requiring increased core activation. Pt performed supine exercises on table with head elevated in a semi-recumbent position.    Previous land: Quad set 2x10  SAQ 3x10  Supine heel slides 2x10 LAQ x10 AROM, 2x10 with 2.5# Seated heel raise 3x10  Seated Ankle DF 3x10 Abduction green band 3x10 Seated marching with GTB 3x10    PATIENT EDUCATION:  Education details:  exercise form Person educated: Patient Education method: Explanation and Demonstration Education comprehension: verbalized understanding  HOME EXERCISE PROGRAM: Access Code: [email or web address] URL: [email or web address] Date: 11/06/2022 Prepared by: Frankie Nekeshia Lenhardt  Exercises - Supine Quad Set  - 1 x daily - 7 x weekly - 3 sets - 10  reps - Supine Heel Slide  - 1 x daily - 7 x weekly - 3 sets - 10 reps - Seated Long Arc Quad  - 1 x daily - 7 x weekly - 3 sets - 10 reps - Supine Active Straight Leg Raise  - 1 x daily - 7 x weekly - 3 sets - 10 reps  ASSESSMENT:  CLINICAL IMPRESSION: Pt has had a new occurrence of pain; now in Lt ankle.  Limited tolerance for Lt SLS, Lt backward stepping (if step length too big), and side stepping to the Rt.  Overall, pain decreased to 6/10 in back, knees and ankle by end of session.  Encouraged pt to have ankle checked out if pain persists.   He will continue to benefit form skilled physical therapy intervention.  Progressing gradually towards remaining goals.         OBJECTIVE IMPAIRMENTS: Abnormal gait, cardiopulmonary status limiting activity, decreased activity tolerance, decreased endurance, decreased mobility, difficulty walking, decreased strength, obesity, and pain.   ACTIVITY LIMITATIONS: carrying, lifting, bending, sitting, standing, squatting, sleeping, stairs, and transfers  PARTICIPATION LIMITATIONS: cleaning, laundry, shopping, community activity, occupation, and yard work  PERSONAL FACTORS: Age, Behavior pattern, Fitness, and 3+ comorbidities: morbid obesity, a-fib, lumbar radiculopathy  are also affecting patient's functional outcome.   REHAB POTENTIAL: Good  CLINICAL DECISION MAKING: Unstable/unpredictable  EVALUATION COMPLEXITY: High   GOALS: Goals reviewed with patient? Yes  SHORT TERM GOALS: Target date: 12/04/22 Pt will tolerate full aquatic sessions consistently without increase in pain and with improving function to demonstrate good toleration and effectiveness of intervention.   Baseline:TBA Goal status:MET -12/03/22  2.  Pt will tolerate stair climbing in and out of pool indep without difficulty Baseline: TBA   Goal status: MET -12/03/22  3.  Pt will improve on 5 X STS test to <or=  14s  to demonstrate improving functional lower extremity strength,  transitional movements, and balance  Baseline: 18.97 from massage table 11.70: 12/11/22 Goal status: MET 12/11/22    LONG TERM GOALS: Target date: 01/01/23  Pt to meet stated Foto Goal 55% Baseline: Risk adjusted 49% primary measure 43%; 12/11/22 47% Goal status: ongoing 12/11/22  2.  Pt will be indep with final HEP's (land and aquatic as appropriate) for continued management of condition  Baseline: TBA Goal status: ongoing 12/11/22  3.  Pt will report amb up towards 30 minutes without requiring seated rest period Baseline: <10 Goal status: INITIAL  4.  Pt will not have any episode of "R knee giving" causing fall. Baseline: 3 last 6 months; continues to "give" but no falls Goal status: Ongoing 12/11/22  5.  Pt will have increase in strength R knee and hip by 10lbs Baseline: see chart Goal status: Ongoing 12/11/22     PLAN:  PT FREQUENCY: 1-2x/week  PT DURATION: 8 weeks  PLANNED INTERVENTIONS: Therapeutic exercises, Therapeutic activity, Neuromuscular re-education, Balance training, Gait training, Patient/Family education, Self Care, Joint mobilization, Joint manipulation, Stair training, Orthotic/Fit training, DME instructions, Aquatic Therapy, Dry Needling, Electrical stimulation, Cryotherapy, Moist heat, scar mobilization, Splintting, Taping, Traction, Ultrasound, Ionotophoresis 4mg/ml Dexamethasone, Manual therapy, and Re-evaluation  PLAN FOR NEXT SESSION: Aquatics: strengthening LE, gait, balance, aerobic capacity  Jennifer Carlson-Long, PTA 12/17/22 4:30 PM [facility] [address] [place], [place], [address] Phone: [phone]   Fax:  [phone]  

## 2022-12-26 ENCOUNTER — Other Ambulatory Visit (HOSPITAL_BASED_OUTPATIENT_CLINIC_OR_DEPARTMENT_OTHER): Payer: Self-pay

## 2022-12-26 ENCOUNTER — Encounter (HOSPITAL_BASED_OUTPATIENT_CLINIC_OR_DEPARTMENT_OTHER): Payer: Commercial Managed Care - PPO | Admitting: Physical Therapy

## 2022-12-26 DIAGNOSIS — I4891 Unspecified atrial fibrillation: Secondary | ICD-10-CM | POA: Diagnosis not present

## 2022-12-26 DIAGNOSIS — G4730 Sleep apnea, unspecified: Secondary | ICD-10-CM | POA: Diagnosis not present

## 2022-12-26 DIAGNOSIS — M10072 Idiopathic gout, left ankle and foot: Secondary | ICD-10-CM | POA: Diagnosis not present

## 2022-12-26 DIAGNOSIS — E559 Vitamin D deficiency, unspecified: Secondary | ICD-10-CM | POA: Diagnosis not present

## 2022-12-26 DIAGNOSIS — Z0001 Encounter for general adult medical examination with abnormal findings: Secondary | ICD-10-CM | POA: Diagnosis not present

## 2022-12-26 DIAGNOSIS — I10 Essential (primary) hypertension: Secondary | ICD-10-CM | POA: Diagnosis not present

## 2022-12-26 DIAGNOSIS — R7301 Impaired fasting glucose: Secondary | ICD-10-CM | POA: Diagnosis not present

## 2022-12-26 MED ORDER — PREDNISONE 20 MG PO TABS
20.0000 mg | ORAL_TABLET | Freq: Every day | ORAL | 0 refills | Status: DC
  Filled 2022-12-26: qty 7, 7d supply, fill #0

## 2022-12-26 MED ORDER — GUAIFENESIN-CODEINE 100-10 MG/5ML PO SOLN
5.0000 mL | ORAL | 0 refills | Status: DC | PRN
  Filled 2022-12-26: qty 120, 2d supply, fill #0

## 2022-12-29 DIAGNOSIS — G4733 Obstructive sleep apnea (adult) (pediatric): Secondary | ICD-10-CM | POA: Diagnosis not present

## 2022-12-29 NOTE — Therapy (Signed)
OUTPATIENT PHYSICAL THERAPY LOWER EXTREMITY TREATMENT PHYSICAL THERAPY DISCHARGE SUMMARY  Visits from Start of Care: 13  Current functional level related to goals / functional outcomes: Pt is safe and indep with all functional mobility and ADL's   Remaining deficits: Chronic pain due to OA   Education / Equipment: Management of condition/ HEP's   Patient agrees to discharge. Patient goals were partially met. Patient is being discharged due to maximized rehab potential.     Patient Name: Charles Barnett MRN: [id number] DOB:08-29-1966, 57 y.o., male Today's Date: 12/31/2022  END OF SESSION:  PT End of Session - 12/31/22 1516     Visit Number 13    Number of Visits 16    Date for PT Re-Evaluation 01/01/23    Authorization Type [employer]et[employer]    Progress Note Due on Visit 20    PT Start Time 1446    PT Stop Time 1530    PT Time Calculation (min) 44 min    Activity Tolerance Patient tolerated treatment well    Behavior During Therapy [facility] for tasks assessed/performed                Past Medical History:  Diagnosis Date   Arthritis    Atrial flutter 12/2016   Hypertension    Neuromuscular disorder    Sleep apnea    Past Surgical History:  Procedure Laterality Date   CARDIOVERSION N/A 01/30/2017   Procedure: CARDIOVERSION;  Surgeon: Wendall Stade, MD;  Location: [facility] ENDOSCOPY;  Service: Cardiovascular;  Laterality: N/A;   CARDIOVERSION N/A 06/27/2021   Procedure: CARDIOVERSION;  Surgeon: Chrystie Nose, MD;  Location: [facility] ENDOSCOPY;  Service: Cardiovascular;  Laterality: N/A;   CARDIOVERSION N/A 07/25/2021   Procedure: CARDIOVERSION;  Surgeon: Chilton Si, MD;  Location: [facility] ENDOSCOPY;  Service: Cardiovascular;  Laterality: N/A;   CARDIOVERSION N/A 05/09/2022   Procedure: CARDIOVERSION;  Surgeon: Pricilla Riffle, MD;  Location: [facility] ENDOSCOPY;  Service: Cardiovascular;  Laterality: N/A;   SPINE SURGERY     Patient Active Problem List   Diagnosis Date Noted    Hyperparathyroidism, primary 10/18/2017   Morbid obesity 02/13/2017   High risk medication use 02/05/2017   Chronic pain syndrome 01/30/2017   Lumbosacral spondylosis 01/30/2017   Persistent atrial fibrillation    Hypercalcemia 01/09/2017   Atrial flutter 01/07/2017   Left shoulder pain 10/18/2016   Right knee pain 10/18/2016   OSA (obstructive sleep apnea) 01/06/2014   Cannot sleep 10/01/2013   Body mass index (BMI) of 50-59.9 in adult 06/25/2013   Postoperative back pain 05/27/2013   Essential hypertension 05/27/2013   H/O disease 03/22/2013    PCP: Dois Davenport, MD   REFERRING PROVIDER: Dois Davenport, MD   REFERRING DIAG: Degenerative joint disease of bilateral knees   THERAPY DIAG:  Chronic pain of both knees  Difficulty in walking, not elsewhere classified  Muscle weakness (generalized)  Rationale for Evaluation and Treatment: Rehabilitation  ONSET DATE: >10 yrs  SUBJECTIVE:   SUBJECTIVE STATEMENT: Pt reports primary care said no Gout left foot. Has scheduled xray. He reports he did not tolerate land based therpay well as it increased knee pain as well as new right foot discomfort.  PERTINENT HISTORY: A-fib Unilateral primary osteoarthritis, left knee  Primary osteoarthritis, right shoulder  Unilateral primary osteoarthritis, right knee  Radiculopathy, lumbar region  Spinal cord stimulator in lumbar  PAIN:  Are you having pain? Yes: NPRS scale: 8/10 back/ knees;  10/10 Lt ant ankle and heel  Pain location: see above  Pain description: aching, nagging ; ankle = sharp Aggravating factors: laying flat, moving, standing x , walking with cane <5 mins Relieving factors: resting in a supine position and propping leg   PRECAUTIONS: Knee, Cervical, Back, and Shoulder  WEIGHT BEARING RESTRICTIONS: No  FALLS:  Has patient fallen in last 6 months? Yes. Number of falls 3 from right knee giving  out   LIVING ENVIRONMENT: Lives with: lives  with their family Lives in: House/apartment Stairs: No Has following equipment at home: Single point cane and Environmental consultant - 2 wheeled  OCCUPATION: retired  PLOF: Independent with household mobility with device and Independent with community mobility with device  PATIENT GOALS: decrease pain, decrease weight  NEXT MD VISIT: 11/17/22  OBJECTIVE:   DIAGNOSTIC FINDINGS: No recent imaging in chart  PATIENT SURVEYS:  FOTO Risk adjusted 49% primary measure 43 % with goal of 55% 12th visit 10th visit 12/11/22: 47%  COGNITION: Overall cognitive status: Within functional limits for tasks assessed     SENSATION: Numbness in feet ~ 1 x month with lying   EDEMA:  occasional   POSTURE: flexed trunk   PALPATION: Unable to assess due to body habitus  LOWER EXTREMITY ROM:  Active ROM Right eval Left eval Right / left  Hip flexion     Hip extension     Hip abduction     Hip adduction     Hip internal rotation     Hip external rotation     Knee flexion 95 may be limited due to body habitus 102 130 / 115  Knee extension 0 possibly hyper mobile 0   Ankle dorsiflexion     Ankle plantarflexion     Ankle inversion     Ankle eversion      (Blank rows = not tested)  LOWER EXTREMITY MMT:  MMT Right eval Left eval Right / Left 12/11/22 Right / Left  Hip flexion 52.9 57.8    Hip extension      Hip abduction 41.5 64.4 49.0 / 64.2 61.2 / 73.6  Hip adduction      Hip internal rotation      Hip external rotation      Knee flexion 63.5 62 68.0 / 64.3 68.9 / 74.8  Knee extension      Ankle dorsiflexion      Ankle plantarflexion      Ankle inversion      Ankle eversion       (Blank rows = not tested)  LOWER EXTREMITY SPECIAL TESTS:  Patellofemoral grind test: positive R and Left  FUNCTIONAL TESTS:  5 times sit to stand: 18.97 from massage table Timed up and go (TUG): to be assessed later /completed  12/11/22 5 x sts 11.70 TUG: 9.50  12/31/22 5 x STS 13.50 TUG:  12.14  GAIT: Distance walked: 400 ft Assistive device utilized: Single point cane Level of assistance: Modified independence Comments: Pt using cane; lateral shift R and L with minimal knee and hip flex clearing feet from floor. Cadence slowed with increased time spent right and left in stance. Step length wnl   TODAY'S TREATMENT:   Objective/dc testing  Pt seen for aquatic therapy today.  Treatment took place in water 3.5-4.75 ft in depth at the [facility] pool. Temp of water was 91.  Pt entered/exited the pool via ladder and stairs with step-to pattern and bilat rail.   *wallking forward and backwards without support, side stepping *standing: add/abd; hip flex/ext *Core engagement using pink bells wide stance then staggered ue oscillations in frontal plane 5 slow 10 fast 2-3 sets   - sagittal plane   Pt requires the buoyancy and hydrostatic pressure of water for support, and to offload joints by unweighting joint load by at least 50 % in navel deep water and by at least 75-80% in chest to neck deep water.  Viscosity of the water is needed for resistance of strengthening. Water current perturbations provides challenge to standing balance requiring increased core activation. Pt performed supine exercises on table with head elevated in a semi-recumbent position.    Previous land: IT consultant 2x10  SAQ 3x10  Supine heel slides 2x10 LAQ x10 AROM, 2x10 with 2.5# Seated heel raise 3x10  Seated Ankle DF 3x10 Abduction green band 3x10 Seated marching with GTB 3x10    PATIENT EDUCATION:  Education details:  exercise form Person educated: Patient Education method: Medical illustrator Education comprehension: verbalized understanding  HOME EXERCISE PROGRAM: Access Code: [email or web address] URL: [email or web address] Date: 11/06/2022 Prepared  by: Geni Bers  Exercises - Supine Quad Set  - 1 x daily - 7 x weekly - 3 sets - 10 reps - Supine Heel Slide  - 1 x daily - 7 x weekly - 3 sets - 10 reps - Seated Long Arc Quad  - 1 x daily - 7 x weekly - 3 sets - 10 reps - Supine Active Straight Leg Raise  - 1 x daily - 7 x weekly - 3 sets - 10 reps  ASSESSMENT:  CLINICAL IMPRESSION: Pt has met his max potential with therapy with current diagnosis.  Most goals met. He has had no falls due to Right knee giving. Has met all functional testing goals, and is indep with final land based and aquatic HEP's gaining access to [facility] pool. He did not meet foto goal or LTG 3 due to complication with left foot pain (gout vs oa). He did improve with right knee strength but by 5 lb rather than 10 initially set.   Pt instructed in additional core strengthening with aerobic capacity element added to HEP submerged.      OBJECTIVE IMPAIRMENTS: Abnormal gait, cardiopulmonary status limiting activity, decreased activity tolerance, decreased endurance, decreased mobility, difficulty walking, decreased strength, obesity, and pain.   ACTIVITY LIMITATIONS: carrying, lifting, bending, sitting, standing, squatting, sleeping, stairs, and transfers  PARTICIPATION LIMITATIONS: cleaning, laundry, shopping, community activity, occupation, and yard work  PERSONAL FACTORS: Age, Behavior pattern, Fitness, and 3+ comorbidities: morbid obesity, a-fib, lumbar radiculopathy  are also affecting patient's functional outcome.   REHAB POTENTIAL: Good  CLINICAL DECISION MAKING: Unstable/unpredictable  EVALUATION COMPLEXITY: High   GOALS: Goals reviewed with patient? Yes  SHORT TERM GOALS: Target date: 12/04/22 Pt will tolerate full aquatic sessions consistently without increase in pain and with improving function to demonstrate good toleration and effectiveness of intervention.   Baseline:TBA Goal status:MET -12/03/22  2.  Pt will tolerate stair climbing in and out of  pool indep without difficulty Baseline: TBA Goal status: MET -12/03/22  3.  Pt will improve on 5 X STS test to <or=  14s  to demonstrate improving functional lower extremity strength, transitional movements,  and balance  Baseline: 18.97 from massage table 11.70: 12/11/22 Goal status: MET 12/11/22    LONG TERM GOALS: Target date: 01/01/23  Pt to meet stated Foto Goal 55% Baseline: Risk adjusted 49% primary measure 43%; 12/11/22 47% Goal status: ongoing 12/11/22 Not met 12/31/22  2.  Pt will be indep with final HEP's (land and aquatic as appropriate) for continued management of condition  Baseline: TBA Goal status: ongoing 12/11/22 Met 12/31/22  3.  Pt will report amb up towards 30 minutes without requiring seated rest period Baseline: <10 Goal status: Not met 12/31/22 due to left foot discomfort  4.  Pt will not have any episode of "R knee giving" causing fall. Baseline: 3 last 6 months; continues to "give" but no falls Goal status: Ongoing 12/11/22 Met 12/31/22  5.  Pt will have increase in strength R knee and hip by 10lbs Baseline: see chart Goal status: Ongoing 12/11/22. Partially met 5 LB gain 12/31/22     PLAN:  PT FREQUENCY: 1-2x/week  PT DURATION: 8 weeks  PLANNED INTERVENTIONS: Therapeutic exercises, Therapeutic activity, Neuromuscular re-education, Balance training, Gait training, Patient/Family education, Self Care, Joint mobilization, Joint manipulation, Stair training, Orthotic/Fit training, DME instructions, Aquatic Therapy, Dry Needling, Electrical stimulation, Cryotherapy, Moist heat, scar mobilization, Splintting, Taping, Traction, Ultrasound, Ionotophoresis /ml Dexamethasone, Manual therapy, and Re-evaluation  PLAN FOR NEXT SESSION: Aquatics: strengthening LE, gait, balance, aerobic capacity  Rushie Chestnut) Markey Deady MPT 12/31/22 3:17 PM [facility] [address] [place], [place], [address] Phone: [phone]    Fax:  [phone]

## 2022-12-31 ENCOUNTER — Encounter (HOSPITAL_BASED_OUTPATIENT_CLINIC_OR_DEPARTMENT_OTHER): Payer: Self-pay | Admitting: Physical Therapy

## 2022-12-31 ENCOUNTER — Ambulatory Visit (HOSPITAL_BASED_OUTPATIENT_CLINIC_OR_DEPARTMENT_OTHER): Payer: Commercial Managed Care - PPO | Admitting: Physical Therapy

## 2022-12-31 DIAGNOSIS — R262 Difficulty in walking, not elsewhere classified: Secondary | ICD-10-CM | POA: Diagnosis not present

## 2022-12-31 DIAGNOSIS — G8929 Other chronic pain: Secondary | ICD-10-CM

## 2022-12-31 DIAGNOSIS — M6281 Muscle weakness (generalized): Secondary | ICD-10-CM

## 2022-12-31 DIAGNOSIS — M25562 Pain in left knee: Secondary | ICD-10-CM | POA: Diagnosis not present

## 2022-12-31 DIAGNOSIS — M25561 Pain in right knee: Secondary | ICD-10-CM | POA: Diagnosis not present

## 2023-01-13 DIAGNOSIS — R0902 Hypoxemia: Secondary | ICD-10-CM | POA: Diagnosis not present

## 2023-01-13 DIAGNOSIS — G4733 Obstructive sleep apnea (adult) (pediatric): Secondary | ICD-10-CM | POA: Diagnosis not present

## 2023-01-17 ENCOUNTER — Other Ambulatory Visit (HOSPITAL_BASED_OUTPATIENT_CLINIC_OR_DEPARTMENT_OTHER): Payer: Self-pay

## 2023-01-17 ENCOUNTER — Other Ambulatory Visit: Payer: Self-pay | Admitting: Internal Medicine

## 2023-01-17 ENCOUNTER — Other Ambulatory Visit: Payer: Self-pay

## 2023-01-17 MED ORDER — AMLODIPINE BESYLATE 5 MG PO TABS
7.5000 mg | ORAL_TABLET | Freq: Every day | ORAL | 3 refills | Status: DC
  Filled 2023-01-17: qty 45, 30d supply, fill #0
  Filled 2023-02-13: qty 45, 30d supply, fill #1
  Filled 2023-03-13: qty 45, 30d supply, fill #2
  Filled 2023-04-10: qty 45, 30d supply, fill #3

## 2023-01-17 MED ORDER — MONTELUKAST SODIUM 10 MG PO TABS
10.0000 mg | ORAL_TABLET | Freq: Every evening | ORAL | 0 refills | Status: DC
  Filled 2023-01-17: qty 90, 90d supply, fill #0

## 2023-01-17 MED ORDER — ALBUTEROL SULFATE HFA 108 (90 BASE) MCG/ACT IN AERS
1.0000 | INHALATION_SPRAY | RESPIRATORY_TRACT | 0 refills | Status: DC | PRN
  Filled 2023-01-17: qty 6.7, 33d supply, fill #0

## 2023-01-28 DIAGNOSIS — G4733 Obstructive sleep apnea (adult) (pediatric): Secondary | ICD-10-CM | POA: Diagnosis not present

## 2023-02-04 ENCOUNTER — Encounter: Payer: Self-pay | Admitting: Internal Medicine

## 2023-02-04 ENCOUNTER — Ambulatory Visit: Payer: Commercial Managed Care - PPO | Attending: Internal Medicine | Admitting: Internal Medicine

## 2023-02-04 VITALS — BP 144/80 | HR 72 | Ht 72.0 in | Wt >= 6400 oz

## 2023-02-04 DIAGNOSIS — I4819 Other persistent atrial fibrillation: Secondary | ICD-10-CM

## 2023-02-04 DIAGNOSIS — G4733 Obstructive sleep apnea (adult) (pediatric): Secondary | ICD-10-CM

## 2023-02-04 DIAGNOSIS — I10 Essential (primary) hypertension: Secondary | ICD-10-CM | POA: Diagnosis not present

## 2023-02-04 NOTE — Patient Instructions (Signed)
Medication Instructions:  NO CHANGES  *If you need a refill on your cardiac medications before your next appointment, please call your pharmacy*    Follow-Up: At [facility], you and your health needs are our priority.  As part of our continuing mission to provide you with exceptional heart care, we have created designated Provider Care Teams.  These Care Teams include your primary Cardiologist (physician) and Advanced Practice Providers (APPs -  Physician Assistants and Nurse Practitioners) who all work together to provide you with the care you need, when you need it.  We recommend signing up for the patient portal called "MyChart".  Sign up information is provided on this After Visit Summary.  MyChart is used to connect with patients for Virtual Visits (Telemedicine).  Patients are able to view lab/test results, encounter notes, upcoming appointments, etc.  Non-urgent messages can be sent to your provider as well.   To learn more about what you can do with MyChart, go to [email or web address].    Your next appointment:    January/February 2025  ** call in September for appointment

## 2023-02-04 NOTE — Progress Notes (Signed)
OFFICE FOLLOW-UP NOTE  Chief Complaint:  Routine follow-up  Primary Care Physician: Dois Davenport, MD  HPI:  Charles Barnett is a 57 y.o. male with a past medial history significant for morbid obesity, obstructive sleep apnea on CPAP, arthritis, hypertension and neuromuscular disorder and chronic low back pain on narcotics and status post spinal stimulator. He reports a one-week history of chest pain and cough, but in retrospect he's had fatigue and shortness of breath for several weeks. He presented to [facility] urgent care today for evaluation and he was found to have atrial flutter with rapid ventricular response. EMS was called to urgent care where he was evaluated and given 5 mg of IV Cardizem. Initial lab work in the ER was unremarkable. Initial troponin was negative. Chest x-ray shows mild cardiomegaly and mild interstitial prominence, probably suggestive of heart failure. He was placed on by mouth Cardizem and Xarelto and discharged with plan for outpatient cardioversion. He saw [place], PA-C, as an outpatient and was switched from Cardizem to Lopressor (there was a possible Cardizem side effect). LVEF was normal on echo. He underwent successful DC cardioversion and is maintaining sinus rhythm. He says he does report some energy increased. Blood pressure remains elevated however today. He was placed on losartan HCTZ which was discontinued. He was also noted recently to have elevated parathyroid hormone and hypercalcemia. He scheduled to see Dr. Everardo All with endocrinology.  02/17/2018  [name] returns today for follow-up.  Since I last saw him he is done well with regards to atrial flutter.  He denies any recurrence.  He has no chest pain or worsening shortness of breath.  Unfortunately weight has gone up further up to 448 pounds.  He is now in the super morbid obesity category with a BMI greater than 60.  Despite this blood pressure is well controlled today.  He is seen by [facility]  endocrinology with Dr. 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  He does not seem to have a diagnosis of diabetes at this point.  He reports significant knee and back pain for which she is on narcotics.  This limits his ambulation.  05/16/2020  [name] returns today for routine follow-up.  He denies any recurrent atrial fibrillation.  He reports strict compliance with the CPAP.  Unfortunately weight is variable and was as low as 380 pounds but now is back up to 448 pounds.  He is not established with any weight management clinic and is not considering gastric bypass surgery or procedures at this point.  He is working with Dr. Everardo All.  Blood pressure was a little elevated today.  EKG shows sinus rhythm.  He is due for some repeat labs next month.  He denies bleeding issues on Xarelto.  09/05/2022  [name] is seen today in follow-up.  He continues to follow with the atrial fibrillation clinic but has been referred back.  He is finally in a sinus rhythm and seems to be maintaining that on the dronedarone 40 mg twice daily.  He is also alto for anticoagulation.  Blood pressure appears to be well-controlled.  Weight unfortunately has increased substantially.  He is now up to 470 pounds, more than 100 pounds over his weight back in 2021.  He is going to try to work on this some more including planning to start water therapy at [place] well.  02/04/2023  [name] is seen today in follow-up.  He is been followed at the  A-fib clinic.  In general he is maintaining sinus rhythm on combination of Multaq and metoprolol with Xarelto for a blood thinner.  He still struggled with weight loss.  He had some success in the aquatic rehab but BMI remains 63.  He has had back pain issues which have not been amenable to therapy.  He had a spinal stimulator apparently which is no longer functional.  EKG today shows sinus rhythm with first-degree AV  block.  PMHx:  Past Medical History:  Diagnosis Date   Arthritis    Atrial flutter (HCC) 12/2016   Hypertension    Neuromuscular disorder (HCC)    Sleep apnea     Past Surgical History:  Procedure Laterality Date   CARDIOVERSION N/A 01/30/2017   Procedure: CARDIOVERSION;  Surgeon: Wendall Stade, MD;  Location: [facility] ENDOSCOPY;  Service: Cardiovascular;  Laterality: N/A;   CARDIOVERSION N/A 06/27/2021   Procedure: CARDIOVERSION;  Surgeon: Chrystie Nose, MD;  Location: [facility] ENDOSCOPY;  Service: Cardiovascular;  Laterality: N/A;   CARDIOVERSION N/A 07/25/2021   Procedure: CARDIOVERSION;  Surgeon: Chilton Si, MD;  Location: [facility] ENDOSCOPY;  Service: Cardiovascular;  Laterality: N/A;   CARDIOVERSION N/A 05/09/2022   Procedure: CARDIOVERSION;  Surgeon: Pricilla Riffle, MD;  Location: [facility] ENDOSCOPY;  Service: Cardiovascular;  Laterality: N/A;   SPINE SURGERY      FAMHx:  Family History  Problem Relation Age of Onset   Diabetes Mother    Hyperlipidemia Mother    Hypertension Mother    Heart disease Mother    Diabetes Sister    Hypertension Sister    Heart disease Sister    Hyperparathyroidism Neg Hx     SOCHx:   reports that he has never smoked. He has quit using smokeless tobacco.  His smokeless tobacco use included chew. He reports that he does not drink alcohol and does not use drugs.  ALLERGIES:  Allergies  Allergen Reactions   Ibuprofen Other (See Comments)    Bloody stool   Iodine Swelling    Including contrast dye     ROS: Pertinent items noted in HPI and remainder of comprehensive ROS otherwise negative.  HOME MEDS: Current Outpatient Medications on File Prior to Visit  Medication Sig Dispense Refill   albuterol (VENTOLIN HFA) 108 (90 Base) MCG/ACT inhaler Inhale 1 puff into the lungs every 4 (four) hours as needed. 6.7 g 0   amLODipine (NORVASC) 5 MG tablet Take 1 & 1/2 tablets (7.5 mg total) by mouth daily. 45 tablet 3   baclofen (LIORESAL) 10 MG tablet Take  10 mg by mouth 3 (three) times daily as needed for muscle spasms.     Budeson-Glycopyrrol-Formoterol (BREZTRI AEROSPHERE) 160-9-4.8 MCG/ACT AERO Inhale 2 puffs into the lungs 2 (two) times daily. (Patient taking differently: Inhale 2 puffs into the lungs 2 (two) times daily as needed (respiratory issues.).) 10.7 g 0   Budeson-Glycopyrrol-Formoterol (BREZTRI AEROSPHERE) 160-9-4.8 MCG/ACT AERO Take 2 puffs by mouth 2 (two) times daily. 10.7 g 0   colchicine 0.6 MG tablet Take 2 tablets at the start of a flare. After 1 hour take 1 more tablet. Do not repeat dosing for 72 hours. 30 tablet 0   dronedarone (MULTAQ) 400 MG tablet Take 1 tablet (400 mg total) by mouth 2 (two) times daily with a meal. 60 tablet 6   ergocalciferol (VITAMIN D2) 1.25 MG (50000 UT) capsule Take 2 capsules (100,000 Units total) by mouth 2 (two) times a week. 24 capsule 3   guaiFENesin-codeine 100-10 MG/5ML syrup  Take 5-10 mLs by mouth every 4 (four) hours as needed. 120 mL 0   ipratropium-albuterol (DUONEB) 0.5-2.5 (3) MG/3ML SOLN Take 3 mLs (1 vial) by nebulization every 6 (six) hours as needed. 90 mL 0   metoprolol tartrate (LOPRESSOR) 50 MG tablet Take 1 tablet (50 mg total) by mouth 2 (two) times daily with food. 180 tablet 2   montelukast (SINGULAIR) 10 MG tablet Take 1 tablet (10 mg total) by mouth every evening. 90 tablet 0   predniSONE (DELTASONE) 20 MG tablet Take 1 tablet (20 mg total) by mouth daily. 7 tablet 0   rivaroxaban (XARELTO) 20 MG TABS tablet Take 1 tablet (20 mg total) by mouth daily with food. 90 tablet 3   No current facility-administered medications on file prior to visit.    LABS/IMAGING: No results found for this or any previous visit (from the past 48 hour(s)). No results found.  LIPID PANEL:    Component Value Date/Time   CHOL 176 08/08/2015 1348   TRIG 96 08/08/2015 1348   HDL 43 08/08/2015 1348   CHOLHDL 4.1 08/08/2015 1348   VLDL 19 08/08/2015 1348   LDLCALC 114 08/08/2015 1348      WEIGHTS: Wt Readings from Last 3 Encounters:  02/04/23 (!) 466 lb (211.4 kg)  11/19/22 (!) 483 lb 3.2 oz (219.2 kg)  09/05/22 (!) 470 lb 12.8 oz (213.6 kg)    VITALS: BP (!) 144/80   Pulse 72   Ht 6' (1.829 m)   Wt (!) 466 lb (211.4 kg)   SpO2 96%   BMI 63.20 kg/m   EXAM: General appearance: alert, no distress and morbidly obese Neck: no carotid bruit and no JVD Lungs: clear to auscultation bilaterally Heart: regular rate and rhythm Abdomen: soft, non-tender; bowel sounds normal; no masses,  no organomegaly Extremities: extremities normal, atraumatic, no cyanosis or edema Pulses: 2+ and symmetric Skin: Skin color, texture, turgor normal. No rashes or lesions Neurologic: Grossly normal Psych: Pleasant  EKG: Sinus rhythm first-degree AV block at 72, low voltage QRS-personally reviewed  ASSESSMENT: Persistent atrial fibrillation/atrial flutter status post DCCV-CHADSVASC score of 1 - on Multaq and Xarelto Hypertension-uncontrolled Super morbid obesity Obstructive sleep apnea on CPAP  PLAN: 1.   Mr. Varelas is fortunately maintaining sinus rhythm on Multaq, metoprolol and Xarelto.  Blood pressure is notably a little high today however he said he did not take his medicines this morning.  He is compliant with CPAP.  He is working on aquatic therapy and plans to hopefully start on one of the new GLP-1 agonists.  He has follow-up with the A-fib clinic in the fall and can see me early next year.  Chrystie Nose, MD, [facility], FACP  [place]  [facility]  Medical Director of the Advanced Lipid Disorders &  Cardiovascular Risk Reduction Clinic Diplomate of the American Board of Clinical Lipidology Attending Cardiologist  Direct Dial: [phone]  Fax: [phone]  Website:  [email or web address] Nicely Amel Kitch 02/04/2023, 11:59 AM

## 2023-02-12 DIAGNOSIS — R0902 Hypoxemia: Secondary | ICD-10-CM | POA: Diagnosis not present

## 2023-02-12 DIAGNOSIS — G4733 Obstructive sleep apnea (adult) (pediatric): Secondary | ICD-10-CM | POA: Diagnosis not present

## 2023-02-21 ENCOUNTER — Other Ambulatory Visit (HOSPITAL_BASED_OUTPATIENT_CLINIC_OR_DEPARTMENT_OTHER): Payer: Self-pay

## 2023-02-24 DIAGNOSIS — M10032 Idiopathic gout, left wrist: Secondary | ICD-10-CM | POA: Diagnosis not present

## 2023-02-24 DIAGNOSIS — M109 Gout, unspecified: Secondary | ICD-10-CM | POA: Diagnosis not present

## 2023-02-24 DIAGNOSIS — G4730 Sleep apnea, unspecified: Secondary | ICD-10-CM | POA: Diagnosis not present

## 2023-02-24 DIAGNOSIS — M10042 Idiopathic gout, left hand: Secondary | ICD-10-CM | POA: Diagnosis not present

## 2023-02-24 DIAGNOSIS — E559 Vitamin D deficiency, unspecified: Secondary | ICD-10-CM | POA: Diagnosis not present

## 2023-02-28 DIAGNOSIS — G4733 Obstructive sleep apnea (adult) (pediatric): Secondary | ICD-10-CM | POA: Diagnosis not present

## 2023-03-03 ENCOUNTER — Other Ambulatory Visit (HOSPITAL_BASED_OUTPATIENT_CLINIC_OR_DEPARTMENT_OTHER): Payer: Self-pay

## 2023-03-03 DIAGNOSIS — M109 Gout, unspecified: Secondary | ICD-10-CM | POA: Diagnosis not present

## 2023-03-03 DIAGNOSIS — G894 Chronic pain syndrome: Secondary | ICD-10-CM | POA: Diagnosis not present

## 2023-03-03 DIAGNOSIS — I10 Essential (primary) hypertension: Secondary | ICD-10-CM | POA: Diagnosis not present

## 2023-03-03 MED ORDER — ALLOPURINOL 100 MG PO TABS
ORAL_TABLET | ORAL | 0 refills | Status: DC
  Filled 2023-03-03: qty 84, 42d supply, fill #0

## 2023-03-15 DIAGNOSIS — G4733 Obstructive sleep apnea (adult) (pediatric): Secondary | ICD-10-CM | POA: Diagnosis not present

## 2023-03-15 DIAGNOSIS — R0902 Hypoxemia: Secondary | ICD-10-CM | POA: Diagnosis not present

## 2023-03-21 ENCOUNTER — Other Ambulatory Visit (HOSPITAL_BASED_OUTPATIENT_CLINIC_OR_DEPARTMENT_OTHER): Payer: Self-pay

## 2023-03-25 ENCOUNTER — Other Ambulatory Visit (HOSPITAL_BASED_OUTPATIENT_CLINIC_OR_DEPARTMENT_OTHER): Payer: Self-pay

## 2023-03-25 MED ORDER — DOXYCYCLINE HYCLATE 100 MG PO TABS
100.0000 mg | ORAL_TABLET | Freq: Two times a day (BID) | ORAL | 0 refills | Status: DC
  Filled 2023-03-25: qty 14, 7d supply, fill #0

## 2023-03-30 DIAGNOSIS — G4733 Obstructive sleep apnea (adult) (pediatric): Secondary | ICD-10-CM | POA: Diagnosis not present

## 2023-04-08 ENCOUNTER — Other Ambulatory Visit (HOSPITAL_BASED_OUTPATIENT_CLINIC_OR_DEPARTMENT_OTHER): Payer: Self-pay

## 2023-04-08 MED ORDER — ALLOPURINOL 100 MG PO TABS
ORAL_TABLET | ORAL | 0 refills | Status: DC
  Filled 2023-04-08: qty 84, 42d supply, fill #0

## 2023-04-14 DIAGNOSIS — G4733 Obstructive sleep apnea (adult) (pediatric): Secondary | ICD-10-CM | POA: Diagnosis not present

## 2023-04-14 DIAGNOSIS — R0902 Hypoxemia: Secondary | ICD-10-CM | POA: Diagnosis not present

## 2023-04-16 ENCOUNTER — Other Ambulatory Visit (HOSPITAL_BASED_OUTPATIENT_CLINIC_OR_DEPARTMENT_OTHER): Payer: Self-pay

## 2023-04-16 MED ORDER — MONTELUKAST SODIUM 10 MG PO TABS
10.0000 mg | ORAL_TABLET | Freq: Every evening | ORAL | 0 refills | Status: DC
  Filled 2023-04-16: qty 90, 90d supply, fill #0

## 2023-04-28 DIAGNOSIS — G4733 Obstructive sleep apnea (adult) (pediatric): Secondary | ICD-10-CM | POA: Diagnosis not present

## 2023-05-08 ENCOUNTER — Other Ambulatory Visit (HOSPITAL_BASED_OUTPATIENT_CLINIC_OR_DEPARTMENT_OTHER): Payer: Self-pay

## 2023-05-08 ENCOUNTER — Other Ambulatory Visit: Payer: Self-pay | Admitting: Internal Medicine

## 2023-05-08 ENCOUNTER — Other Ambulatory Visit: Payer: Self-pay

## 2023-05-08 MED ORDER — AMLODIPINE BESYLATE 5 MG PO TABS
7.5000 mg | ORAL_TABLET | Freq: Every day | ORAL | 3 refills | Status: DC
  Filled 2023-05-08: qty 45, 30d supply, fill #0
  Filled 2023-06-05: qty 45, 30d supply, fill #1
  Filled 2023-07-03: qty 45, 30d supply, fill #2
  Filled 2023-08-04: qty 45, 30d supply, fill #3

## 2023-05-15 DIAGNOSIS — R0902 Hypoxemia: Secondary | ICD-10-CM | POA: Diagnosis not present

## 2023-05-15 DIAGNOSIS — G4733 Obstructive sleep apnea (adult) (pediatric): Secondary | ICD-10-CM | POA: Diagnosis not present

## 2023-05-19 ENCOUNTER — Other Ambulatory Visit: Payer: Self-pay | Admitting: Internal Medicine

## 2023-05-20 ENCOUNTER — Other Ambulatory Visit (HOSPITAL_BASED_OUTPATIENT_CLINIC_OR_DEPARTMENT_OTHER): Payer: Self-pay

## 2023-05-20 ENCOUNTER — Other Ambulatory Visit: Payer: Self-pay

## 2023-05-20 MED ORDER — ALLOPURINOL 100 MG PO TABS
ORAL_TABLET | ORAL | 0 refills | Status: DC
  Filled 2023-05-20: qty 84, 42d supply, fill #0

## 2023-05-20 MED ORDER — RIVAROXABAN 20 MG PO TABS
20.0000 mg | ORAL_TABLET | Freq: Every day | ORAL | 3 refills | Status: DC
  Filled 2023-05-20: qty 90, 90d supply, fill #0
  Filled 2023-08-08: qty 63, 63d supply, fill #1
  Filled 2023-10-17: qty 90, 90d supply, fill #2
  Filled 2024-01-15: qty 90, 90d supply, fill #3

## 2023-05-20 NOTE — Telephone Encounter (Signed)
Prescription refill request for Xarelto received.  Indication:afib Last office visit:5/24 Weight:211.4  kg Age:57 Scr:0.95  8/23 CrCl:253.46  ml/min  Prescription refilled

## 2023-05-21 ENCOUNTER — Other Ambulatory Visit (HOSPITAL_BASED_OUTPATIENT_CLINIC_OR_DEPARTMENT_OTHER): Payer: Self-pay

## 2023-05-21 ENCOUNTER — Other Ambulatory Visit: Payer: Self-pay

## 2023-05-27 ENCOUNTER — Ambulatory Visit (HOSPITAL_COMMUNITY)
Admission: RE | Admit: 2023-05-27 | Discharge: 2023-05-27 | Disposition: A | Discharge status: Home / Self Care | Payer: Commercial Managed Care - PPO | Source: Ambulatory Visit | Attending: Physician Assistant | Admitting: Physician Assistant

## 2023-05-27 VITALS — BP 146/92 | HR 78 | Ht 72.0 in | Wt >= 6400 oz

## 2023-05-27 DIAGNOSIS — I10 Essential (primary) hypertension: Secondary | ICD-10-CM | POA: Insufficient documentation

## 2023-05-27 DIAGNOSIS — I4892 Unspecified atrial flutter: Secondary | ICD-10-CM | POA: Insufficient documentation

## 2023-05-27 DIAGNOSIS — Z6841 Body Mass Index (BMI) 40.0 and over, adult: Secondary | ICD-10-CM | POA: Insufficient documentation

## 2023-05-27 DIAGNOSIS — R9431 Abnormal electrocardiogram [ECG] [EKG]: Secondary | ICD-10-CM | POA: Insufficient documentation

## 2023-05-27 DIAGNOSIS — I4819 Other persistent atrial fibrillation: Secondary | ICD-10-CM | POA: Insufficient documentation

## 2023-05-27 DIAGNOSIS — Z7901 Long term (current) use of anticoagulants: Secondary | ICD-10-CM | POA: Insufficient documentation

## 2023-05-27 DIAGNOSIS — I4891 Unspecified atrial fibrillation: Secondary | ICD-10-CM | POA: Diagnosis present

## 2023-05-27 LAB — CBC
HCT: 46.8 % (ref 39.0–52.0)
Hemoglobin: 14 g/dL (ref 13.0–17.0)
MCH: 25.4 pg — ABNORMAL LOW (ref 26.0–34.0)
MCHC: 29.9 g/dL — ABNORMAL LOW (ref 30.0–36.0)
MCV: 84.9 fL (ref 80.0–100.0)
Platelets: 227 10*3/uL (ref 150–400)
RBC: 5.51 MIL/uL (ref 4.22–5.81)
RDW: 16 % — ABNORMAL HIGH (ref 11.5–15.5)
WBC: 9 10*3/uL (ref 4.0–10.5)
nRBC: 0 % (ref 0.0–0.2)

## 2023-05-27 LAB — BASIC METABOLIC PANEL
Anion gap: 9 (ref 5–15)
BUN: 8 mg/dL (ref 6–20)
CO2: 22 mmol/L (ref 22–32)
Calcium: 9.8 mg/dL (ref 8.9–10.3)
Chloride: 106 mmol/L (ref 98–111)
Creatinine, Ser: 0.96 mg/dL (ref 0.61–1.24)
GFR, Estimated: >60 mL/min (ref >60)
Glucose, Bld: 119 mg/dL — ABNORMAL HIGH (ref 70–99)
Potassium: 3.8 mmol/L (ref 3.5–5.1)
Sodium: 137 mmol/L (ref 135–145)

## 2023-05-27 LAB — MAGNESIUM: Magnesium: 1.9 mg/dL (ref 1.7–2.4)

## 2023-05-27 NOTE — Progress Notes (Signed)
Unable to reach patient about procedure, but was able to leave a detailed message. Stated that the patient needed to arrive at the hospital at 0815 , remain NPO after 0000, needs to have a ride home and a responsible adult to stay with them for 24 hours after the procedure. Instructed the patient to call back if they had any questions.

## 2023-05-27 NOTE — Progress Notes (Signed)
Primary Care Physician: Dois Davenport, MD Primary Cardiologist: Dr Rennis Golden  Primary Electrophysiologist: none Referring Physician: Edd Fabian NP   Charles Barnett is a 57 y.o. male with a history of morbid obesity, OSA, atrial flutter, HTN, chronic back pain s/p spinal stimulator who presents for follow up in the [facility].  The patient was initially diagnosed with atrial flutter in 2018 after presenting to urgent care with symptoms of SOB and fatigue. He was started on Xarelto for a CHADS2VASC score of 1 and underwent DCCV on 01/30/17. He was found to be back in atrial flutter on follow up 06/04/21 and had another DCCV on 06/27/21. Unfortunately, he had quick return of his atrial flutter. He was started on Multaq and had repeat DCCV on 07/25/21. Patient is s/p DCCV on 05/09/22.   On follow up today, patient reports that about one week ago he noticed increased dyspnea on exertion and chest tightness. He checked his Kardia mobile device and it has shown afib since that time. There were no specific triggers that he could identify.   Today, he denies symptoms of palpitations, orthopnea, PND, lower extremity edema, dizziness, presyncope, syncope, bleeding, or neurologic sequela. The patient is tolerating medications without difficulties and is otherwise without complaint today.    Atrial Fibrillation Risk Factors:  he does have symptoms or diagnosis of sleep apnea. he is compliant with CPAP therapy. he does not have a history of rheumatic fever.   Atrial Fibrillation Management history:  Previous antiarrhythmic drugs: Multaq Previous cardioversions: 01/30/17, 06/27/21, 07/25/21, 05/09/22 Previous ablations: none Anticoagulation history: Xarelto    Past Medical History:  Diagnosis Date   Arthritis    Atrial flutter (HCC) 12/2016   Hypertension    Neuromuscular disorder (HCC)    Sleep apnea     Current Outpatient Medications  Medication Sig Dispense  Refill   albuterol (VENTOLIN HFA) 108 (90 Base) MCG/ACT inhaler Inhale 1 puff into the lungs every 4 (four) hours as needed. 6.7 g 0   allopurinol (ZYLOPRIM) 100 MG tablet Take 1 tablet (100 mg total) by mouth daily for 14 days, THEN 2 tablets (200 mg total) daily for 14 days, THEN 3 tablets (300 mg total) daily for 14 days. 84 tablet 0   amLODipine (NORVASC) 5 MG tablet Take 1 & 1/2 tablets (7.5 mg total) by mouth daily. 45 tablet 3   baclofen (LIORESAL) 10 MG tablet Take 10 mg by mouth 3 (three) times daily as needed for muscle spasms.     Budeson-Glycopyrrol-Formoterol (BREZTRI AEROSPHERE) 160-9-4.8 MCG/ACT AERO Take 2 puffs by mouth 2 (two) times daily. 10.7 g 0   dronedarone (MULTAQ) 400 MG tablet Take 1 tablet (400 mg total) by mouth 2 (two) times daily with a meal. 60 tablet 6   ergocalciferol (VITAMIN D2) 1.25 MG (50000 UT) capsule Take 2 capsules (100,000 Units total) by mouth 2 (two) times a week. 24 capsule 3   ipratropium-albuterol (DUONEB) 0.5-2.5 (3) MG/3ML SOLN Take 3 mLs (1 vial) by nebulization every 6 (six) hours as needed. 90 mL 0   metoprolol tartrate (LOPRESSOR) 50 MG tablet Take 1 tablet (50 mg total) by mouth 2 (two) times daily with food. 180 tablet 2   montelukast (SINGULAIR) 10 MG tablet Take 1 tablet (10 mg total) by mouth every evening. 90 tablet 0   Naproxen Sodium (ALEVE PO) Take 2 tablets by mouth 2 (two) times a week.     rivaroxaban (XARELTO) 20 MG TABS tablet Take  1 tablet (20 mg total) by mouth daily with food. 90 tablet 3   No current facility-administered medications for this encounter.    ROS- All systems are reviewed and negative except as per the HPI above.  Physical Exam: Vitals:   05/27/23 1028  BP: (!) 146/92  Pulse: 78  Weight: (!) 214.6 kg  Height: 6' (1.829 m)     GEN: Well nourished, well developed in no acute distress NECK: No JVD; No carotid bruits CARDIAC: Irregularly irregular rate and rhythm, no murmurs, rubs, gallops RESPIRATORY:   Clear to auscultation without rales, wheezing or rhonchi  ABDOMEN: Soft, non-tender, non-distended EXTREMITIES:  No edema; No deformity    Wt Readings from Last 3 Encounters:  05/27/23 (!) 214.6 kg  02/04/23 (!) 211.4 kg  11/19/22 (!) 219.2 kg    EKG today demonstrates  Afib, slow R wave prog Vent. rate 78 BPM PR interval * ms QRS duration 102 ms QT/QTcB 390/444 ms  Echo 01/08/17 demonstrated  - Left ventricle: The cavity size was normal. Wall thickness was    increased in a pattern of mild LVH. Systolic function was normal.    The estimated ejection fraction was in the range of 55% to 60%.    Indeterminant diastolic function (atrial fibrillation). Although    no diagnostic regional wall motion abnormality was identified,    this possibility cannot be completely excluded on the basis of    this study.  - Ventricular septum: Mildly D-shaped interventricular septum,    suggestive of RV pressure/volume overload.  - Aortic valve: There was no stenosis.  - Mitral valve: There was no significant regurgitation.  - Right ventricle: The cavity size was mildly to moderately    dilated. Systolic function was normal.  - Right atrium: The atrium was mildly dilated.  - Atrial septum: Atrial septal aneurysm noted.  - Pulmonary arteries: No complete TR doppler jet so unable to    estimate PA systolic pressure.  - Systemic veins: IVC was not visualized.   Impressions:   - Technically difficult study with poor acoustic windows. Normal LV    size with mild LV hypertrophy. EF 55-60%. Mildly D-shaped    interventricular septum is suggestive of a degree of RV    pressure/volume overload. Mild to moderate RV dilation with    normal systolic function. Unable to estimate PA systolic pressure    on this study.   Epic records are reviewed at length today  CHA2DS2-VASc Score = 1  The patient's score is based upon: CHF History: 0 HTN History: 1 Diabetes History: 0 Stroke History: 0 Vascular  Disease History: 0 Age Score: 0 Gender Score: 0        ASSESSMENT AND PLAN: Persistent atrial fibrillation/Atrial flutter The patient's CHA2DS2-VASc score is 1, indicating a 0.6% annual risk of stroke.   Would avoid class IC with LAFB and inc RBBB on ECG.  Patient back in persistent afib for the past week. We discussed rhythm control options. Will arrange for DCCV. We also discussed changing AAD to dofetilide, information sheet provided today. Continue Multaq 400 mg BID Continue Xarelto 20 mg daily, patient denies any missed doses in the past 3 weeks.  Continue Lopressor 50 mg BID Kardia mobile for home monitoring.   Morbid Obesity Body mass index is 64.15 kg/m. Suspect this is significantly contributing the persistence of his arrhythmias.  Patient declined referral to [facility].  Encouraged lifestyle modification  OSA  Encouraged nightly CPAP  HTN Borderline elevated today, reevaluate  in SR   Follow up in the AF clinic post DCCV.    Informed Consent   Shared Decision Making/Informed Consent The risks (stroke, cardiac arrhythmias rarely resulting in the need for a temporary or permanent pacemaker, skin irritation or burns and complications associated with conscious sedation including aspiration, arrhythmia, respiratory failure and death), benefits (restoration of normal sinus rhythm) and alternatives of a direct current cardioversion were explained in detail to Mr. Slaski and he agrees to proceed.         Jorja Loa PA-C Afib Clinic [facility] [address] [place], [place] [address] [phone] 05/27/2023 11:00 AM

## 2023-05-27 NOTE — Patient Instructions (Signed)
Cardioversion scheduled for: Wednesday, September 11th   - Arrive at the [facility] and go to admitting at 930am   - Do not eat or drink anything after midnight the night prior to your procedure.   - Take all your morning medication (except diabetic medications) with a sip of water prior to arrival.  - You will not be able to drive home after your procedure.    - Do NOT miss any doses of your blood thinner - if you should miss a dose please notify our office immediately.   - If you feel as if you go back into normal rhythm prior to scheduled cardioversion, please notify our office immediately.   If your procedure is canceled in the cardioversion suite you will be charged a cancellation fee.

## 2023-05-27 NOTE — H&P (View-Only) (Signed)
Primary Care Physician: Dois Davenport, MD Primary Cardiologist: Dr Rennis Golden  Primary Electrophysiologist: none Referring Physician: Edd Fabian NP   Charles Barnett is a 57 y.o. male with a history of morbid obesity, OSA, atrial flutter, HTN, chronic back pain s/p spinal stimulator who presents for follow up in the [facility].  The patient was initially diagnosed with atrial flutter in 2018 after presenting to urgent care with symptoms of SOB and fatigue. He was started on Xarelto for a CHADS2VASC score of 1 and underwent DCCV on 01/30/17. He was found to be back in atrial flutter on follow up 06/04/21 and had another DCCV on 06/27/21. Unfortunately, he had quick return of his atrial flutter. He was started on Multaq and had repeat DCCV on 07/25/21. Patient is s/p DCCV on 05/09/22.   On follow up today, patient reports that about one week ago he noticed increased dyspnea on exertion and chest tightness. He checked his Kardia mobile device and it has shown afib since that time. There were no specific triggers that he could identify.   Today, he denies symptoms of palpitations, orthopnea, PND, lower extremity edema, dizziness, presyncope, syncope, bleeding, or neurologic sequela. The patient is tolerating medications without difficulties and is otherwise without complaint today.    Atrial Fibrillation Risk Factors:  he does have symptoms or diagnosis of sleep apnea. he is compliant with CPAP therapy. he does not have a history of rheumatic fever.   Atrial Fibrillation Management history:  Previous antiarrhythmic drugs: Multaq Previous cardioversions: 01/30/17, 06/27/21, 07/25/21, 05/09/22 Previous ablations: none Anticoagulation history: Xarelto    Past Medical History:  Diagnosis Date   Arthritis    Atrial flutter (HCC) 12/2016   Hypertension    Neuromuscular disorder (HCC)    Sleep apnea     Current Outpatient Medications  Medication Sig Dispense  Refill   albuterol (VENTOLIN HFA) 108 (90 Base) MCG/ACT inhaler Inhale 1 puff into the lungs every 4 (four) hours as needed. 6.7 g 0   allopurinol (ZYLOPRIM) 100 MG tablet Take 1 tablet (100 mg total) by mouth daily for 14 days, THEN 2 tablets (200 mg total) daily for 14 days, THEN 3 tablets (300 mg total) daily for 14 days. 84 tablet 0   amLODipine (NORVASC) 5 MG tablet Take 1 & 1/2 tablets (7.5 mg total) by mouth daily. 45 tablet 3   baclofen (LIORESAL) 10 MG tablet Take 10 mg by mouth 3 (three) times daily as needed for muscle spasms.     Budeson-Glycopyrrol-Formoterol (BREZTRI AEROSPHERE) 160-9-4.8 MCG/ACT AERO Take 2 puffs by mouth 2 (two) times daily. 10.7 g 0   dronedarone (MULTAQ) 400 MG tablet Take 1 tablet (400 mg total) by mouth 2 (two) times daily with a meal. 60 tablet 6   ergocalciferol (VITAMIN D2) 1.25 MG (50000 UT) capsule Take 2 capsules (100,000 Units total) by mouth 2 (two) times a week. 24 capsule 3   ipratropium-albuterol (DUONEB) 0.5-2.5 (3) MG/3ML SOLN Take 3 mLs (1 vial) by nebulization every 6 (six) hours as needed. 90 mL 0   metoprolol tartrate (LOPRESSOR) 50 MG tablet Take 1 tablet (50 mg total) by mouth 2 (two) times daily with food. 180 tablet 2   montelukast (SINGULAIR) 10 MG tablet Take 1 tablet (10 mg total) by mouth every evening. 90 tablet 0   Naproxen Sodium (ALEVE PO) Take 2 tablets by mouth 2 (two) times a week.     rivaroxaban (XARELTO) 20 MG TABS tablet Take  1 tablet (20 mg total) by mouth daily with food. 90 tablet 3   No current facility-administered medications for this encounter.    ROS- All systems are reviewed and negative except as per the HPI above.  Physical Exam: Vitals:   05/27/23 1028  BP: (!) 146/92  Pulse: 78  Weight: (!) 214.6 kg  Height: 6' (1.829 m)     GEN: Well nourished, well developed in no acute distress NECK: No JVD; No carotid bruits CARDIAC: Irregularly irregular rate and rhythm, no murmurs, rubs, gallops RESPIRATORY:   Clear to auscultation without rales, wheezing or rhonchi  ABDOMEN: Soft, non-tender, non-distended EXTREMITIES:  No edema; No deformity    Wt Readings from Last 3 Encounters:  05/27/23 (!) 214.6 kg  02/04/23 (!) 211.4 kg  11/19/22 (!) 219.2 kg    EKG today demonstrates  Afib, slow R wave prog Vent. rate 78 BPM PR interval * ms QRS duration 102 ms QT/QTcB 390/444 ms  Echo 01/08/17 demonstrated  - Left ventricle: The cavity size was normal. Wall thickness was    increased in a pattern of mild LVH. Systolic function was normal.    The estimated ejection fraction was in the range of 55% to 60%.    Indeterminant diastolic function (atrial fibrillation). Although    no diagnostic regional wall motion abnormality was identified,    this possibility cannot be completely excluded on the basis of    this study.  - Ventricular septum: Mildly D-shaped interventricular septum,    suggestive of RV pressure/volume overload.  - Aortic valve: There was no stenosis.  - Mitral valve: There was no significant regurgitation.  - Right ventricle: The cavity size was mildly to moderately    dilated. Systolic function was normal.  - Right atrium: The atrium was mildly dilated.  - Atrial septum: Atrial septal aneurysm noted.  - Pulmonary arteries: No complete TR doppler jet so unable to    estimate PA systolic pressure.  - Systemic veins: IVC was not visualized.   Impressions:   - Technically difficult study with poor acoustic windows. Normal LV    size with mild LV hypertrophy. EF 55-60%. Mildly D-shaped    interventricular septum is suggestive of a degree of RV    pressure/volume overload. Mild to moderate RV dilation with    normal systolic function. Unable to estimate PA systolic pressure    on this study.   Epic records are reviewed at length today  CHA2DS2-VASc Score = 1  The patient's score is based upon: CHF History: 0 HTN History: 1 Diabetes History: 0 Stroke History: 0 Vascular  Disease History: 0 Age Score: 0 Gender Score: 0        ASSESSMENT AND PLAN: Persistent atrial fibrillation/Atrial flutter The patient's CHA2DS2-VASc score is 1, indicating a 0.6% annual risk of stroke.   Would avoid class IC with LAFB and inc RBBB on ECG.  Patient back in persistent afib for the past week. We discussed rhythm control options. Will arrange for DCCV. We also discussed changing AAD to dofetilide, information sheet provided today. Continue Multaq 400 mg BID Continue Xarelto 20 mg daily, patient denies any missed doses in the past 3 weeks.  Continue Lopressor 50 mg BID Kardia mobile for home monitoring.   Morbid Obesity Body mass index is 64.15 kg/m. Suspect this is significantly contributing the persistence of his arrhythmias.  Patient declined referral to [facility].  Encouraged lifestyle modification  OSA  Encouraged nightly CPAP  HTN Borderline elevated today, reevaluate  in SR   Follow up in the AF clinic post DCCV.    Informed Consent   Shared Decision Making/Informed Consent The risks (stroke, cardiac arrhythmias rarely resulting in the need for a temporary or permanent pacemaker, skin irritation or burns and complications associated with conscious sedation including aspiration, arrhythmia, respiratory failure and death), benefits (restoration of normal sinus rhythm) and alternatives of a direct current cardioversion were explained in detail to Mr. Poisson and he agrees to proceed.         Jorja Loa PA-C Af[facility] [address] [place], [place] [address] [phone] 05/27/2023 11:00 AM

## 2023-05-28 ENCOUNTER — Encounter (HOSPITAL_COMMUNITY): Payer: Self-pay | Admitting: Cardiovascular Disease

## 2023-05-28 ENCOUNTER — Ambulatory Visit (HOSPITAL_COMMUNITY): Payer: Commercial Managed Care - PPO

## 2023-05-28 ENCOUNTER — Encounter (HOSPITAL_COMMUNITY)
Admission: RE | Disposition: A | Discharge status: Home / Self Care | Payer: Self-pay | Source: Home / Self Care | Attending: Cardiovascular Disease

## 2023-05-28 ENCOUNTER — Ambulatory Visit (HOSPITAL_COMMUNITY)
Admission: RE | Admit: 2023-05-28 | Discharge: 2023-05-28 | Disposition: A | Discharge status: Home / Self Care | Payer: Commercial Managed Care - PPO | Attending: Cardiovascular Disease | Admitting: Cardiovascular Disease

## 2023-05-28 ENCOUNTER — Other Ambulatory Visit: Payer: Self-pay

## 2023-05-28 DIAGNOSIS — G4733 Obstructive sleep apnea (adult) (pediatric): Secondary | ICD-10-CM

## 2023-05-28 DIAGNOSIS — I4892 Unspecified atrial flutter: Secondary | ICD-10-CM | POA: Insufficient documentation

## 2023-05-28 DIAGNOSIS — I4819 Other persistent atrial fibrillation: Secondary | ICD-10-CM

## 2023-05-28 DIAGNOSIS — I10 Essential (primary) hypertension: Secondary | ICD-10-CM | POA: Diagnosis not present

## 2023-05-28 DIAGNOSIS — Z79899 Other long term (current) drug therapy: Secondary | ICD-10-CM | POA: Insufficient documentation

## 2023-05-28 DIAGNOSIS — Z6841 Body Mass Index (BMI) 40.0 and over, adult: Secondary | ICD-10-CM | POA: Diagnosis not present

## 2023-05-28 DIAGNOSIS — Z7901 Long term (current) use of anticoagulants: Secondary | ICD-10-CM | POA: Insufficient documentation

## 2023-05-28 DIAGNOSIS — J449 Chronic obstructive pulmonary disease, unspecified: Secondary | ICD-10-CM | POA: Diagnosis not present

## 2023-05-28 HISTORY — PX: CARDIOVERSION: SHX1299

## 2023-05-28 SURGERY — CARDIOVERSION
Anesthesia: General

## 2023-05-28 MED ORDER — LIDOCAINE 2% (20 MG/ML) 5 ML SYRINGE
INTRAMUSCULAR | Status: DC | PRN
  Administered 2023-05-28: 40 mg via INTRAVENOUS

## 2023-05-28 MED ORDER — PROPOFOL 10 MG/ML IV BOLUS
INTRAVENOUS | Status: DC | PRN
  Administered 2023-05-28: 30 mg via INTRAVENOUS
  Administered 2023-05-28: 40 mg via INTRAVENOUS
  Administered 2023-05-28: 160 mg via INTRAVENOUS

## 2023-05-28 MED ORDER — SODIUM CHLORIDE 0.9 % IV SOLN: INTRAVENOUS | Status: DC

## 2023-05-28 SURGICAL SUPPLY — 1 items: ELECT DEFIB PAD ADLT CADENCE (PAD) ×1 IMPLANT

## 2023-05-28 NOTE — Transfer of Care (Signed)
Immediate Anesthesia Transfer of Care Note  Patient: Charles Barnett  Procedure(s) Performed: CARDIOVERSION  Patient Location: PACU and Cath Lab  Anesthesia Type:General  Level of Consciousness: awake  Airway & Oxygen Therapy: Patient Spontanous Breathing  Post-op Assessment: Report given to RN  Post vital signs: stable  Last Vitals:  Vitals Value Taken Time  BP    Temp    Pulse    Resp    SpO2      Last Pain:  Vitals:   05/28/23 0940  TempSrc:   PainSc: 0-No pain         Complications: No notable events documented.

## 2023-05-28 NOTE — Anesthesia Preprocedure Evaluation (Addendum)
Anesthesia Evaluation  Patient identified by MRN, date of birth, ID band Patient awake    Reviewed: Allergy & Precautions, NPO status , Patient's Chart, lab work & pertinent test results, reviewed documented beta blocker date and time   History of Anesthesia Complications Negative for: history of anesthetic complications  Airway Mallampati: II  TM Distance: >3 FB Neck ROM: Full    Dental  (+) Missing, Dental Advisory Given   Pulmonary sleep apnea and Continuous Positive Airway Pressure Ventilation , COPD,  COPD inhaler   breath sounds clear to auscultation       Cardiovascular hypertension, Pt. on medications and Pt. on home beta blockers (-) angina + dysrhythmias Atrial Fibrillation  Rhythm:Irregular Rate:Normal  '18 ECHO: EF 55% to 60%.    Indeterminant diastolic function (atrial fibrillation). Although    no diagnostic regional wall motion abnormality was identified,    this possibility cannot be completely excluded on the basis of    this study.  - Ventricular septum: Mildly D-shaped interventricular septum,    suggestive of RV pressure/volume overload.  - Aortic valve: There was no stenosis.  - Mitral valve: There was no significant regurgitation.  - Right ventricle: The cavity size was mildly to moderately    dilated. Systolic function was normal.  - Right atrium: The atrium was mildly dilated.  - Atrial septum: Atrial septal aneurysm noted.     Neuro/Psych negative neurological ROS     GI/Hepatic negative GI ROS, Neg liver ROS,,,  Endo/Other  BMI 64.2  Renal/GU negative Renal ROS     Musculoskeletal  (+) Arthritis ,    Abdominal  (+) + obese  Peds  Hematology xarelto   Anesthesia Other Findings   Reproductive/Obstetrics                              Anesthesia Physical Anesthesia Plan  ASA: 4  Anesthesia Plan: General   Post-op Pain Management: Minimal or no pain  anticipated   Induction: Intravenous  PONV Risk Score and Plan: 2 and Treatment may vary due to age or medical condition  Airway Management Planned: Simple Face Mask  Additional Equipment: None  Intra-op Plan:   Post-operative Plan:   Informed Consent: I have reviewed the patients History and Physical, chart, labs and discussed the procedure including the risks, benefits and alternatives for the proposed anesthesia with the patient or authorized representative who has indicated his/her understanding and acceptance.     Dental advisory given  Plan Discussed with: CRNA and Surgeon  Anesthesia Plan Comments:          Anesthesia Quick Evaluation

## 2023-05-28 NOTE — Anesthesia Postprocedure Evaluation (Addendum)
Anesthesia Post Note  Patient: Charles Barnett  Procedure(s) Performed: CARDIOVERSION     Patient location during evaluation: Cath Lab Anesthesia Type: General Level of consciousness: awake and alert, patient cooperative and oriented Pain management: pain level controlled Vital Signs Assessment: post-procedure vital signs reviewed and stable Respiratory status: spontaneous breathing, nonlabored ventilation and respiratory function stable Cardiovascular status: blood pressure returned to baseline and stable Postop Assessment: no apparent nausea or vomiting Anesthetic complications: no   No notable events documented.  Last Vitals:  Vitals:   05/28/23 1050 05/28/23 1100  BP: (!) 111/50 101/88  Pulse: 85 85  Resp: 20 (!) 21  Temp:  36.6 C  SpO2: 97% 96%    Last Pain:  Vitals:   05/28/23 1100  TempSrc: Temporal  PainSc: 0-No pain                 Makailyn Mccormick,E. Advay Volante

## 2023-05-28 NOTE — CV Procedure (Signed)
Electrical Cardioversion Procedure Note Charles Barnett [id number] 1965/11/09  Procedure: Electrical Cardioversion Indications:  Atrial Fibrillation  Procedure Details Consent: Risks of procedure as well as the alternatives and risks of each were explained to the (patient/caregiver).  Consent for procedure obtained. Time Out: Verified patient identification, verified procedure, site/side was marked, verified correct patient position, special equipment/implants available, medications/allergies/relevent history reviewed, required imaging and test results available.  Performed  Patient placed on cardiac monitor, pulse oximetry, supplemental oxygen as necessary.  Sedation given:  propofol Pacer pads placed  anterior chest and L axillary line .  Cardioverted  4  time(s).  Cardioverted at 360J, 360J, 360J, 360J with pressure.  All unsuccessful.  Evaluation Findings: Post procedure EKG shows: Atrial Fibrillation Complications: None Patient did tolerate procedure well.   Chilton Si, MD 05/28/2023, 10:41 AM

## 2023-05-28 NOTE — Interval H&P Note (Signed)
History and Physical Interval Note:  05/28/2023 10:19 AM  Charles Barnett  has presented today for surgery, with the diagnosis of AFIB.  The various methods of treatment have been discussed with the patient and family. After consideration of risks, benefits and other options for treatment, the patient has consented to  Procedure(s): CARDIOVERSION (N/A) as a surgical intervention.  The patient's history has been reviewed, patient examined, no change in status, stable for surgery.  I have reviewed the patient's chart and labs.  Questions were answered to the patient's satisfaction.     Chilton Si, MD

## 2023-05-29 ENCOUNTER — Encounter (HOSPITAL_COMMUNITY): Payer: Self-pay | Admitting: Cardiovascular Disease

## 2023-05-29 DIAGNOSIS — G4733 Obstructive sleep apnea (adult) (pediatric): Secondary | ICD-10-CM | POA: Diagnosis not present

## 2023-05-30 ENCOUNTER — Other Ambulatory Visit (HOSPITAL_BASED_OUTPATIENT_CLINIC_OR_DEPARTMENT_OTHER): Payer: Self-pay

## 2023-06-05 ENCOUNTER — Other Ambulatory Visit (HOSPITAL_COMMUNITY): Payer: Self-pay | Admitting: Physician Assistant

## 2023-06-05 ENCOUNTER — Other Ambulatory Visit: Payer: Self-pay

## 2023-06-05 ENCOUNTER — Other Ambulatory Visit (HOSPITAL_BASED_OUTPATIENT_CLINIC_OR_DEPARTMENT_OTHER): Payer: Self-pay

## 2023-06-05 MED ORDER — MULTAQ 400 MG PO TABS
400.0000 mg | ORAL_TABLET | Freq: Two times a day (BID) | ORAL | 6 refills | Status: DC
  Filled 2023-06-05: qty 60, 30d supply, fill #0

## 2023-06-07 ENCOUNTER — Other Ambulatory Visit (HOSPITAL_BASED_OUTPATIENT_CLINIC_OR_DEPARTMENT_OTHER): Payer: Self-pay

## 2023-06-11 ENCOUNTER — Ambulatory Visit (HOSPITAL_COMMUNITY)
Admission: RE | Admit: 2023-06-11 | Discharge: 2023-06-11 | Disposition: A | Discharge status: Home / Self Care | Payer: Commercial Managed Care - PPO | Source: Ambulatory Visit | Attending: Physician Assistant | Admitting: Physician Assistant

## 2023-06-11 ENCOUNTER — Encounter (HOSPITAL_COMMUNITY): Payer: Self-pay | Admitting: Physician Assistant

## 2023-06-11 ENCOUNTER — Other Ambulatory Visit (HOSPITAL_BASED_OUTPATIENT_CLINIC_OR_DEPARTMENT_OTHER): Payer: Self-pay

## 2023-06-11 ENCOUNTER — Encounter (HOSPITAL_BASED_OUTPATIENT_CLINIC_OR_DEPARTMENT_OTHER): Payer: Self-pay

## 2023-06-11 ENCOUNTER — Telehealth: Payer: Self-pay | Admitting: Pharmacist

## 2023-06-11 VITALS — BP 136/70 | HR 77 | Ht 72.0 in | Wt >= 6400 oz

## 2023-06-11 DIAGNOSIS — I4819 Other persistent atrial fibrillation: Secondary | ICD-10-CM | POA: Diagnosis not present

## 2023-06-11 DIAGNOSIS — Z6841 Body Mass Index (BMI) 40.0 and over, adult: Secondary | ICD-10-CM | POA: Insufficient documentation

## 2023-06-11 DIAGNOSIS — I4892 Unspecified atrial flutter: Secondary | ICD-10-CM | POA: Diagnosis not present

## 2023-06-11 DIAGNOSIS — E669 Obesity, unspecified: Secondary | ICD-10-CM | POA: Diagnosis not present

## 2023-06-11 DIAGNOSIS — G4733 Obstructive sleep apnea (adult) (pediatric): Secondary | ICD-10-CM | POA: Insufficient documentation

## 2023-06-11 DIAGNOSIS — Z79899 Other long term (current) drug therapy: Secondary | ICD-10-CM | POA: Diagnosis not present

## 2023-06-11 DIAGNOSIS — I10 Essential (primary) hypertension: Secondary | ICD-10-CM | POA: Diagnosis not present

## 2023-06-11 DIAGNOSIS — Z7901 Long term (current) use of anticoagulants: Secondary | ICD-10-CM | POA: Insufficient documentation

## 2023-06-11 MED ORDER — FUROSEMIDE 20 MG PO TABS
20.0000 mg | ORAL_TABLET | Freq: Every day | ORAL | 0 refills | Status: AC | PRN
  Filled 2023-06-11: qty 10, 10d supply, fill #0

## 2023-06-11 NOTE — Patient Instructions (Signed)
Stop multaq   Start Lasix 20mg  - take 1 tablet daily for the next 3 days then only as needed for swelling/wt gain

## 2023-06-11 NOTE — Progress Notes (Signed)
Primary Care Physician: Dois Davenport, MD Primary Cardiologist: Dr Rennis Golden  Primary Electrophysiologist: none Referring Physician: Edd Fabian NP   Charles Barnett is a 57 y.o. male with a history of morbid obesity, OSA, atrial flutter, HTN, chronic back pain s/p spinal stimulator who presents for follow up in the [facility].  The patient was initially diagnosed with atrial flutter in 2018 after presenting to urgent care with symptoms of SOB and fatigue. He was started on Xarelto for a CHADS2VASC score of 1 and underwent DCCV on 01/30/17. He was found to be back in atrial flutter on follow up 06/04/21 and had another DCCV on 06/27/21. Unfortunately, he had quick return of his atrial flutter. He was started on Multaq and had repeat DCCV on 07/25/21. Patient is s/p DCCV on 05/09/22.   On follow up today, patient is s/p DCCV x 4 on 05/28/23 which was unsuccessful. Patient does feel more SOB since DCCV. His weight is also up about 10 lbs. Difficult to assess edema due to body habitus. He does have some orthopnea but usually sleeps propped up anyway due to back pain.   Today, he denies symptoms of PND, lower extremity edema, dizziness, presyncope, syncope, bleeding, or neurologic sequela. The patient is tolerating medications without difficulties and is otherwise without complaint today.    Atrial Fibrillation Risk Factors:  he does have symptoms or diagnosis of sleep apnea. he is compliant with CPAP therapy. he does not have a history of rheumatic fever.   Atrial Fibrillation Management history:  Previous antiarrhythmic drugs: Multaq Previous cardioversions: 01/30/17, 06/27/21, 07/25/21, 05/09/22, 05/28/23 Previous ablations: none Anticoagulation history: Xarelto    Past Medical History:  Diagnosis Date   Arthritis    Atrial flutter (HCC) 12/2016   Hypertension    Neuromuscular disorder (HCC)    Sleep apnea     Current Outpatient Medications  Medication  Sig Dispense Refill   albuterol (VENTOLIN HFA) 108 (90 Base) MCG/ACT inhaler Inhale 1 puff into the lungs every 4 (four) hours as needed. 6.7 g 0   allopurinol (ZYLOPRIM) 100 MG tablet Take 1 tablet (100 mg total) by mouth daily for 14 days, THEN 2 tablets (200 mg total) daily for 14 days, THEN 3 tablets (300 mg total) daily for 14 days. 84 tablet 0   amLODipine (NORVASC) 5 MG tablet Take 1 & 1/2 tablets (7.5 mg total) by mouth daily. 45 tablet 3   Ascorbic Acid (VITAMIN C PO) Take 1 tablet by mouth daily as needed (immune support/onset cold symptoms).     baclofen (LIORESAL) 10 MG tablet Take 10 mg by mouth 3 (three) times daily as needed for muscle spasms.     ergocalciferol (VITAMIN D2) 1.25 MG (50000 UT) capsule Take 2 capsules (100,000 Units total) by mouth 2 (two) times a week. 24 capsule 3   furosemide (LASIX) 20 MG tablet Take 1 tablet (20 mg total) by mouth daily as needed (swelling/wt gain). 10 tablet 0   ipratropium-albuterol (DUONEB) 0.5-2.5 (3) MG/3ML SOLN Take 3 mLs (1 vial) by nebulization every 6 (six) hours as needed. 90 mL 0   metoprolol tartrate (LOPRESSOR) 50 MG tablet Take 1 tablet (50 mg total) by mouth 2 (two) times daily with food. 180 tablet 2   montelukast (SINGULAIR) 10 MG tablet Take 1 tablet (10 mg total) by mouth every evening. 90 tablet 0   naproxen sodium (ALEVE) 220 MG tablet Take 440 mg by mouth 2 (two) times a week.  rivaroxaban (XARELTO) 20 MG TABS tablet Take 1 tablet (20 mg total) by mouth daily with food. 90 tablet 3   No current facility-administered medications for this encounter.    ROS- All systems are reviewed and negative except as per the HPI above.  Physical Exam: Vitals:   06/11/23 1007  BP: 136/70  Pulse: 77  Weight: (!) 219.7 kg  Height: 6' (1.829 m)   GEN: Well nourished, well developed in no acute distress NECK: No JVD; No carotid bruits CARDIAC: Irregularly irregular rate and rhythm, no murmurs, rubs, gallops RESPIRATORY:  Clear to  auscultation without rales, wheezing or rhonchi  ABDOMEN: Soft, non-tender, non-distended EXTREMITIES:  edema difficult to assess due to body habitus, No deformity    Wt Readings from Last 3 Encounters:  06/11/23 (!) 219.7 kg  05/28/23 (!) 214.6 kg  05/27/23 (!) 214.6 kg    EKG today demonstrates  Afib, slow R wave prog  Echo 01/08/17 demonstrated  - Left ventricle: The cavity size was normal. Wall thickness was    increased in a pattern of mild LVH. Systolic function was normal.    The estimated ejection fraction was in the range of 55% to 60%.    Indeterminant diastolic function (atrial fibrillation). Although    no diagnostic regional wall motion abnormality was identified,    this possibility cannot be completely excluded on the basis of    this study.  - Ventricular septum: Mildly D-shaped interventricular septum,    suggestive of RV pressure/volume overload.  - Aortic valve: There was no stenosis.  - Mitral valve: There was no significant regurgitation.  - Right ventricle: The cavity size was mildly to moderately    dilated. Systolic function was normal.  - Right atrium: The atrium was mildly dilated.  - Atrial septum: Atrial septal aneurysm noted.  - Pulmonary arteries: No complete TR doppler jet so unable to    estimate PA systolic pressure.  - Systemic veins: IVC was not visualized.   Impressions:   - Technically difficult study with poor acoustic windows. Normal LV    size with mild LV hypertrophy. EF 55-60%. Mildly D-shaped    interventricular septum is suggestive of a degree of RV    pressure/volume overload. Mild to moderate RV dilation with    normal systolic function. Unable to estimate PA systolic pressure    on this study.   Epic records are reviewed at length today  CHA2DS2-VASc Score = 1  The patient's score is based upon: CHF History: 0 HTN History: 1 Diabetes History: 0 Stroke History: 0 Vascular Disease History: 0 Age Score: 0 Gender Score:  0        ASSESSMENT AND PLAN: Persistent atrial fibrillation/Atrial flutter The patient's CHA2DS2-VASc score is 1, indicating a 0.6% annual risk of stroke.   Would avoid class IC with LAFB and inc RBBB on ECG. Failed DCCV on Multaq after 4 shocks. We discussed rhythm control options today. Patient would like to pursue dofetilide. Continue Xarelto 20 mg daily, states no missed doses in the last 3 weeks. No recent benadryl use PharmD to screen medications. Will stop Multaq today.  Recent bmet/mag reviewed.  QTc in SR 444 ms Continue Xarelto 20 mg daily Continue Lopressor 50 mg BID Kardia mobile for home monitoring.  Will start Lasix 20 mg daily x 3 days  Obesity Body mass index is 65.7 kg/m.  Encouraged lifestyle modification Suspect this is significantly contributing the persistence of his arrhythmias.  Patient declined referral to [facility].  OSA  Encouraged nightly CPAP  HTN Stable on current regimen   Follow up in the AF clinic for dofetilide loading 06/23/23.    Jorja Loa PA-C Af[facility] [address] [place], [place] [address] [phone] 06/11/2023 11:25 AM

## 2023-06-11 NOTE — Telephone Encounter (Signed)
Medication list reviewed in anticipation of upcoming Tikosyn initiation. Patient uses albuterol and Duonebs which are QTc prolonging but ok to continue. He does not take any contraindicated medications, Multaq was stopped today.  Patient is anticoagulated on Xarelto 20mg  daily on the appropriate dose. Please ensure that patient has not missed any anticoagulation doses in the 3 weeks prior to Tikosyn initiation.   Patient will need to be counseled to avoid use of Benadryl while on Tikosyn and in the 2-3 days prior to Tikosyn initiation.

## 2023-06-13 ENCOUNTER — Telehealth (HOSPITAL_COMMUNITY): Payer: Self-pay

## 2023-06-13 NOTE — Telephone Encounter (Signed)
Tikosyn admission approved for 10/07 for inpatient stay.  Reference# [id number]  To approve more days fax:[phone]

## 2023-06-15 DIAGNOSIS — G4733 Obstructive sleep apnea (adult) (pediatric): Secondary | ICD-10-CM | POA: Diagnosis not present

## 2023-06-15 DIAGNOSIS — R0902 Hypoxemia: Secondary | ICD-10-CM | POA: Diagnosis not present

## 2023-06-16 ENCOUNTER — Other Ambulatory Visit (HOSPITAL_COMMUNITY): Payer: Self-pay | Admitting: Physician Assistant

## 2023-06-17 ENCOUNTER — Other Ambulatory Visit (HOSPITAL_BASED_OUTPATIENT_CLINIC_OR_DEPARTMENT_OTHER): Payer: Self-pay

## 2023-06-20 ENCOUNTER — Encounter (HOSPITAL_COMMUNITY): Payer: Self-pay

## 2023-06-23 ENCOUNTER — Other Ambulatory Visit: Payer: Self-pay

## 2023-06-23 ENCOUNTER — Inpatient Hospital Stay (HOSPITAL_COMMUNITY)
Admission: AD | Admit: 2023-06-23 | Discharge: 2023-06-26 | DRG: 309 | Disposition: A | Discharge status: Home / Self Care | Payer: Commercial Managed Care - PPO | Source: Ambulatory Visit | Attending: Cardiology | Admitting: Cardiology

## 2023-06-23 ENCOUNTER — Ambulatory Visit (HOSPITAL_BASED_OUTPATIENT_CLINIC_OR_DEPARTMENT_OTHER)
Admission: RE | Admit: 2023-06-23 | Discharge: 2023-06-23 | Disposition: A | Discharge status: Home / Self Care | Payer: Commercial Managed Care - PPO | Source: Ambulatory Visit | Attending: Physician Assistant | Admitting: Physician Assistant

## 2023-06-23 ENCOUNTER — Encounter (HOSPITAL_COMMUNITY): Payer: Self-pay | Admitting: Physician Assistant

## 2023-06-23 ENCOUNTER — Encounter (HOSPITAL_COMMUNITY): Payer: Self-pay | Admitting: Cardiology

## 2023-06-23 ENCOUNTER — Other Ambulatory Visit (HOSPITAL_BASED_OUTPATIENT_CLINIC_OR_DEPARTMENT_OTHER): Payer: Self-pay

## 2023-06-23 VITALS — BP 138/90 | HR 81 | Ht 72.0 in | Wt >= 6400 oz

## 2023-06-23 DIAGNOSIS — I4510 Unspecified right bundle-branch block: Secondary | ICD-10-CM | POA: Insufficient documentation

## 2023-06-23 DIAGNOSIS — Z79899 Other long term (current) drug therapy: Secondary | ICD-10-CM | POA: Insufficient documentation

## 2023-06-23 DIAGNOSIS — Z23 Encounter for immunization: Secondary | ICD-10-CM

## 2023-06-23 DIAGNOSIS — I4892 Unspecified atrial flutter: Secondary | ICD-10-CM | POA: Diagnosis not present

## 2023-06-23 DIAGNOSIS — Z9682 Presence of neurostimulator: Secondary | ICD-10-CM | POA: Insufficient documentation

## 2023-06-23 DIAGNOSIS — Z713 Dietary counseling and surveillance: Secondary | ICD-10-CM | POA: Insufficient documentation

## 2023-06-23 DIAGNOSIS — Z7901 Long term (current) use of anticoagulants: Secondary | ICD-10-CM | POA: Diagnosis not present

## 2023-06-23 DIAGNOSIS — I4819 Other persistent atrial fibrillation: Principal | ICD-10-CM | POA: Diagnosis present

## 2023-06-23 DIAGNOSIS — G8929 Other chronic pain: Secondary | ICD-10-CM | POA: Diagnosis present

## 2023-06-23 DIAGNOSIS — D6869 Other thrombophilia: Secondary | ICD-10-CM | POA: Diagnosis present

## 2023-06-23 DIAGNOSIS — G4730 Sleep apnea, unspecified: Secondary | ICD-10-CM

## 2023-06-23 DIAGNOSIS — M549 Dorsalgia, unspecified: Secondary | ICD-10-CM | POA: Insufficient documentation

## 2023-06-23 DIAGNOSIS — I10 Essential (primary) hypertension: Secondary | ICD-10-CM | POA: Insufficient documentation

## 2023-06-23 DIAGNOSIS — Z6841 Body Mass Index (BMI) 40.0 and over, adult: Secondary | ICD-10-CM

## 2023-06-23 DIAGNOSIS — Z886 Allergy status to analgesic agent status: Secondary | ICD-10-CM

## 2023-06-23 DIAGNOSIS — R072 Precordial pain: Secondary | ICD-10-CM | POA: Diagnosis present

## 2023-06-23 DIAGNOSIS — G4733 Obstructive sleep apnea (adult) (pediatric): Secondary | ICD-10-CM | POA: Diagnosis present

## 2023-06-23 DIAGNOSIS — E66813 Obesity, class 3: Secondary | ICD-10-CM | POA: Diagnosis not present

## 2023-06-23 DIAGNOSIS — Z5181 Encounter for therapeutic drug level monitoring: Secondary | ICD-10-CM

## 2023-06-23 DIAGNOSIS — Z91041 Radiographic dye allergy status: Secondary | ICD-10-CM | POA: Diagnosis not present

## 2023-06-23 LAB — BASIC METABOLIC PANEL
Anion gap: 12 (ref 5–15)
Anion gap: 9 (ref 5–15)
BUN: 7 mg/dL (ref 6–20)
BUN: 8 mg/dL (ref 6–20)
CO2: 21 mmol/L — ABNORMAL LOW (ref 22–32)
CO2: 27 mmol/L (ref 22–32)
Calcium: 10.1 mg/dL (ref 8.9–10.3)
Calcium: 10.3 mg/dL (ref 8.9–10.3)
Chloride: 105 mmol/L (ref 98–111)
Chloride: 103 mmol/L (ref 98–111)
Creatinine, Ser: 0.86 mg/dL (ref 0.61–1.24)
Creatinine, Ser: 1.04 mg/dL (ref 0.61–1.24)
GFR, Estimated: >60 mL/min (ref >60)
GFR, Estimated: >60 mL/min (ref >60)
Glucose, Bld: 148 mg/dL — ABNORMAL HIGH (ref 70–99)
Glucose, Bld: 101 mg/dL — ABNORMAL HIGH (ref 70–99)
Potassium: 3.6 mmol/L (ref 3.5–5.1)
Potassium: 4.5 mmol/L (ref 3.5–5.1)
Sodium: 138 mmol/L (ref 135–145)
Sodium: 139 mmol/L (ref 135–145)

## 2023-06-23 LAB — HIV ANTIBODY (ROUTINE TESTING W REFLEX): HIV Screen 4th Generation wRfx: NONREACTIVE

## 2023-06-23 LAB — MAGNESIUM: Magnesium: 1.9 mg/dL (ref 1.7–2.4)

## 2023-06-23 MED ORDER — INFLUENZA VIRUS VACC SPLIT PF (FLUZONE) 0.5 ML IM SUSY
0.5000 mL | PREFILLED_SYRINGE | INTRAMUSCULAR | Status: AC
  Administered 2023-06-24: 0.5 mL via INTRAMUSCULAR
  Filled 2023-06-23: qty 0.5

## 2023-06-23 MED ORDER — SODIUM CHLORIDE 0.9% FLUSH
3.0000 mL | Freq: Two times a day (BID) | INTRAVENOUS | Status: DC
  Administered 2023-06-24 – 2023-06-26 (×3): 3 mL via INTRAVENOUS

## 2023-06-23 MED ORDER — ALBUTEROL SULFATE (2.5 MG/3ML) 0.083% IN NEBU: 2.5000 mg | INHALATION_SOLUTION | RESPIRATORY_TRACT | Status: DC | PRN

## 2023-06-23 MED ORDER — MAGNESIUM SULFATE 2 GM/50ML IV SOLN
2.0000 g | Freq: Once | INTRAVENOUS | Status: AC
  Administered 2023-06-23: 2 g via INTRAVENOUS
  Filled 2023-06-23: qty 50

## 2023-06-23 MED ORDER — SODIUM CHLORIDE 0.9 % IV SOLN: 250.0000 mL | INTRAVENOUS | Status: DC | PRN

## 2023-06-23 MED ORDER — IPRATROPIUM-ALBUTEROL 0.5-2.5 (3) MG/3ML IN SOLN: 3.0000 mL | Freq: Four times a day (QID) | RESPIRATORY_TRACT | Status: DC | PRN

## 2023-06-23 MED ORDER — POTASSIUM CHLORIDE CRYS ER 20 MEQ PO TBCR
40.0000 meq | EXTENDED_RELEASE_TABLET | Freq: Once | ORAL | 0 refills | Status: DC
  Filled 2023-06-23 (×2): qty 2, 1d supply, fill #0

## 2023-06-23 MED ORDER — ALBUTEROL SULFATE HFA 108 (90 BASE) MCG/ACT IN AERS: 1.0000 | INHALATION_SPRAY | RESPIRATORY_TRACT | Status: DC | PRN

## 2023-06-23 MED ORDER — METOPROLOL TARTRATE 50 MG PO TABS
50.0000 mg | ORAL_TABLET | Freq: Two times a day (BID) | ORAL | Status: DC
  Administered 2023-06-23 – 2023-06-25 (×4): 50 mg via ORAL
  Filled 2023-06-23 (×5): qty 1

## 2023-06-23 MED ORDER — ALLOPURINOL 300 MG PO TABS
300.0000 mg | ORAL_TABLET | Freq: Every day | ORAL | Status: DC
  Administered 2023-06-23 – 2023-06-26 (×4): 300 mg via ORAL
  Filled 2023-06-23 (×5): qty 1

## 2023-06-23 MED ORDER — POTASSIUM CHLORIDE CRYS ER 20 MEQ PO TBCR
60.0000 meq | EXTENDED_RELEASE_TABLET | ORAL | Status: AC
  Administered 2023-06-23: 60 meq via ORAL
  Filled 2023-06-23: qty 3

## 2023-06-23 MED ORDER — RIVAROXABAN 20 MG PO TABS
20.0000 mg | ORAL_TABLET | Freq: Every day | ORAL | Status: DC
  Administered 2023-06-23 – 2023-06-25 (×3): 20 mg via ORAL
  Filled 2023-06-23 (×3): qty 1

## 2023-06-23 MED ORDER — DOFETILIDE 500 MCG PO CAPS
500.0000 ug | ORAL_CAPSULE | Freq: Two times a day (BID) | ORAL | Status: DC
  Administered 2023-06-23 – 2023-06-26 (×6): 500 ug via ORAL
  Filled 2023-06-23 (×6): qty 1

## 2023-06-23 MED ORDER — SODIUM CHLORIDE 0.9% FLUSH: 3.0000 mL | INTRAVENOUS | Status: DC | PRN

## 2023-06-23 MED ORDER — BACLOFEN 10 MG PO TABS: 10.0000 mg | ORAL_TABLET | Freq: Three times a day (TID) | ORAL | Status: DC | PRN

## 2023-06-23 MED ORDER — AMLODIPINE BESYLATE 5 MG PO TABS
7.5000 mg | ORAL_TABLET | Freq: Every day | ORAL | Status: DC
  Administered 2023-06-24 – 2023-06-26 (×3): 7.5 mg via ORAL
  Filled 2023-06-23 (×3): qty 1

## 2023-06-23 MED ORDER — MONTELUKAST SODIUM 10 MG PO TABS
10.0000 mg | ORAL_TABLET | Freq: Every evening | ORAL | Status: DC
  Administered 2023-06-23 – 2023-06-25 (×3): 10 mg via ORAL
  Filled 2023-06-23 (×3): qty 1

## 2023-06-23 MED ORDER — FUROSEMIDE 20 MG PO TABS: 20.0000 mg | ORAL_TABLET | Freq: Every day | ORAL | Status: DC | PRN

## 2023-06-23 NOTE — H&P (Signed)
Electrophysiology H&P  Note    Primary Care Physician: Default, Provider, MD Primary Cardiologist: Dr Rennis Golden  Primary Electrophysiologist: none Referring Physician: Edd Fabian NP   Charles Barnett is a 57 y.o. male with a history of morbid obesity, OSA, atrial flutter, HTN, chronic back pain s/p spinal stimulator who presents for follow up in the [facility].  The patient was initially diagnosed with atrial flutter in 2018 after presenting to urgent care with symptoms of SOB and fatigue. He was started on Xarelto for a CHADS2VASC score of 1 and underwent DCCV on 01/30/17. He was found to be back in atrial flutter on follow up 06/04/21 and had another DCCV on 06/27/21. Unfortunately, he had quick return of his atrial flutter. He was started on Multaq and had repeat DCCV on 07/25/21. Patient is s/p DCCV on 05/09/22.   Patient is s/p DCCV x 4 on 05/28/23 which was unsuccessful. Multaq discontinued.   On follow up today, patient presents for dofetilide admission. He remains in afib with symptoms of SOB and fatigue. When his heart rate gets >100 bpm he starts to have chest discomfort. No missed doses of anticoagulation in the past 3 weeks.   Today, he denies symptoms of palpitations, SOB, chest pain, orthopnea, PND, lower extremity edema, dizziness, presyncope, syncope, bleeding, or neurologic sequela. The patient is tolerating medications without difficulties and is otherwise without complaint today.    Atrial Fibrillation Risk Factors:  he does have symptoms or diagnosis of sleep apnea. he is compliant with CPAP therapy. he does not have a history of rheumatic fever.   Atrial Fibrillation Management history:  Previous antiarrhythmic drugs: Multaq Previous cardioversions: 01/30/17, 06/27/21, 07/25/21, 05/09/22, 05/28/23 Previous ablations: none Anticoagulation history: Xarelto    Past Medical History:  Diagnosis Date   Arthritis    Atrial flutter (HCC) 12/2016    Hypertension    Neuromuscular disorder (HCC)    Sleep apnea     Current Facility-Administered Medications  Medication Dose Route Frequency Provider Last Rate Last Admin   0.9 %  sodium chloride infusion  250 mL Intravenous PRN Lanier Prude, MD       albuterol (PROVENTIL) (2.5 MG/3ML) 0.083% nebulizer solution 2.5 mg  2.5 mg Nebulization Q4H PRN Lanier Prude, MD       allopurinol (ZYLOPRIM) tablet 300 mg  300 mg Oral Daily Fenton, Clint R, PA   300 mg at 06/23/23 1723   [START ON 06/24/2023] amLODipine (NORVASC) tablet 7.5 mg  7.5 mg Oral Daily Fenton, Clint R, PA       baclofen (LIORESAL) tablet 10 mg  10 mg Oral TID PRN Fenton, Clint R, PA       dofetilide (TIKOSYN) capsule 500 mcg  500 mcg Oral BID Lanier Prude, MD       furosemide (LASIX) tablet 20 mg  20 mg Oral Daily PRN Fenton, Clint R, PA       [START ON 06/24/2023] influenza vac split trivalent PF (FLULAVAL) injection 0.5 mL  0.5 mL Intramuscular Tomorrow-1000 Lanier Prude, MD       ipratropium-albuterol (DUONEB) 0.5-2.5 (3) MG/3ML nebulizer solution 3 mL  3 mL Nebulization Q6H PRN Fenton, Clint R, PA       magnesium sulfate IVPB 2 g 50 mL  2 g Intravenous Once Gardner Candle, RPH 50 mL/hr at 06/23/23 2021 2 g at 06/23/23 2021   metoprolol tartrate (LOPRESSOR) tablet 50 mg  50 mg Oral BID Fenton, Clint R, PA  montelukast (SINGULAIR) tablet 10 mg  10 mg Oral QPM Fenton, Clint R, PA   10 mg at 06/23/23 1724   rivaroxaban (XARELTO) tablet 20 mg  20 mg Oral Q supper Steffanie Dunn T, MD   20 mg at 06/23/23 1723   sodium chloride flush (NS) 0.9 % injection 3 mL  3 mL Intravenous Q12H Steffanie Dunn T, MD       sodium chloride flush (NS) 0.9 % injection 3 mL  3 mL Intravenous PRN Lanier Prude, MD        ROS- All systems are reviewed and negative except as per the HPI above.  Physical Exam:    Vitals:    06/23/23 1137  BP: (!) 138/90  Pulse: 81  Weight: (!) 214.5 kg  Height: 6' (1.829 m)       GEN: Well nourished, well developed in no acute distress NECK: No JVD; No carotid bruits CARDIAC: Irregularly irregular rate and rhythm, no murmurs, rubs, gallops RESPIRATORY:  Clear to auscultation without rales, wheezing or rhonchi  ABDOMEN: Soft, non-tender, non-distended EXTREMITIES:  No edema; No deformity    Wt Readings from Last 3 Encounters:  06/23/23 (!) 214.4 kg  06/23/23 (!) 214.5 kg  06/11/23 (!) 219.7 kg    EKG today demonstrates  Afib Vent. rate 81 BPM PR interval * ms QRS duration 102 ms QT/QTcB 370/429 ms   Echo 01/08/17 demonstrated  - Left ventricle: The cavity size was normal. Wall thickness was    increased in a pattern of mild LVH. Systolic function was normal.    The estimated ejection fraction was in the range of 55% to 60%.    Indeterminant diastolic function (atrial fibrillation). Although    no diagnostic regional wall motion abnormality was identified,    this possibility cannot be completely excluded on the basis of    this study.  - Ventricular septum: Mildly D-shaped interventricular septum,    suggestive of RV pressure/volume overload.  - Aortic valve: There was no stenosis.  - Mitral valve: There was no significant regurgitation.  - Right ventricle: The cavity size was mildly to moderately    dilated. Systolic function was normal.  - Right atrium: The atrium was mildly dilated.  - Atrial septum: Atrial septal aneurysm noted.  - Pulmonary arteries: No complete TR doppler jet so unable to    estimate PA systolic pressure.  - Systemic veins: IVC was not visualized.   Impressions:   - Technically difficult study with poor acoustic windows. Normal LV    size with mild LV hypertrophy. EF 55-60%. Mildly D-shaped    interventricular septum is suggestive of a degree of RV    pressure/volume overload. Mild to moderate RV dilation with    normal systolic function. Unable to estimate PA systolic pressure    on this study.   Epic records are  reviewed at length today  CHA2DS2-VASc Score = 1  The patient's score is based upon: CHF History: 0 HTN History: 1 Diabetes History: 0 Stroke History: 0 Vascular Disease History: 0 Age Score: 0 Gender Score: 0      ASSESSMENT AND PLAN: Persistent atrial fibrillation/Atrial flutter The patient's CHA2DS2-VASc score is 1, indicating a 0.6% annual risk of stroke.   Would avoid class IC with LAFB and inc RBBB on ECG. Failed DCCV on Multaq after 4 shocks.  Patient presents for dofetilide admission. Continue Xarelto 20 mg daily, states no missed doses in the last 3 weeks. No recent benadryl use PharmD has  screened medications QTc in SR 444 ms Labs today show creatinine at 0.86, K+ 3.6 and mag 1.9, CrCl calculated at 284 mL/min Continue Lopressor 50 mg BID Kardia mobile for home monitoring.   Obesity BMI is 64.12  Encouraged lifestyle modification Suspect this is significantly contributing the persistence of his arrhythmias.   OSA  Encouraged nightly CPAP  HTN Stable on current regimen   Pt presents today for tikosyn admission.   Casimiro Needle [address]" [place], PA-C  06/23/2023 3:36 PM   ADDENDUM:   Patient seen and examined with Nelida Gores PA-C.  I personally taken a history, examined the patient, reviewed laboratory data, imaging studies, and notes which are relevant.  I performed a substantive portion of this encounter and formulated the important aspects of the plan.  I agree with the APP's note, impression, and recommendations; however, I have edited the note to reflect changes or salient points.  I have personally discussed the plan w/ patient.   Patient presents today for an elective Tikosyn loading given his persistent atrial fibrillation despite being on other antiarrhythmic medications and undergoing direct-current cardioversions in the past.  Patient has been on Xarelto without interruption for greater than 3 weeks.  Patient complains of some precordial discomfort  which she usually experiences with rapid ventricular rate.  The discomfort is not brought on by effort related activities, does not resolve with rest.  Usually self-limited.  Review of systems are also positive for: Chest pain, dyspnea on exertion, lower extremity swelling.   PHYSICAL EXAM:    06/23/2023    7:51 PM 06/23/2023    4:39 PM 06/23/2023   11:37 AM  Vitals with BMI  Height  6\' 0"  6\' 0"   Weight  472 lbs 11 oz 472 lbs 13 oz  BMI  64.09 64.11  Systolic 139 124 161  Diastolic 99 86 90  Pulse 87 77 81    Net IO Since Admission: No IO data has been entered for this period [06/23/23 2033]  Filed Weights   06/23/23 1639  Weight: (!) 214.4 kg    Physical Exam  Constitutional: No distress.  hemodynamically stable, morbidly obese  Neck:  Unable to evaluate JVP due to short neck stature and adipose tissue  Cardiovascular: S1 normal, S2 normal, intact distal pulses and normal pulses. An irregularly irregular rhythm present. Tachycardia present. Exam reveals no gallop, no S3 and no S4.  No murmur heard. Pulmonary/Chest: Effort normal and breath sounds normal. No stridor. He has no wheezes. He has no rales.  Abdominal: Soft. Bowel sounds are normal. He exhibits no distension. There is no abdominal tenderness.  Musculoskeletal:        General: Edema (Trace bilateral) present.     Cervical back: Neck supple.  Neurological: He is alert and oriented to person, place, and time. He has intact cranial nerves (2-12).  Skin: Skin is warm and moist.    12 lead EKG was personally reviewed by me: 06/23/2023: Atrial fibrillation, 81 bpm, low voltage, incomplete right bundle branch block   Telemetry: Atrial fibrillation with overall controlled ventricular rate, intermittent episodes of RVR   Assessment/Plan: Persistent atrial fibrillation: Rate control: Metoprolol. Rhythm control: Initiation of Tikosyn Thromboembolic prophylaxis: Xarelto. Has been on antiarrhythmic medications in the  past and has failed direct-current cardioversions. Currently getting magnesium replacements prior to initiating Tikosyn. Tentatively scheduled for Tikosyn 500 mcg p.o. twice daily Pharmacy consult for TEE since loading placed-appreciate their assistance  Long-term oral anticoagulation: Currently on Xarelto. Does not endorse evidence of bleeding.  Risks, benefits, and alternatives to anticoagulation discussed.  Precordial pain: Predominantly noncardiac. Has had episodes of such discomfort in the past with an increased ventricular rate. Continue to monitor.  Sleep apnea: Patient has brought his home CPAP.  HTN: Continue home meds  CODE STATUS: Full code, verified with the patient  Further recommendations to follow as the case evolves.   This note was created using a voice recognition software as a result there may be grammatical errors inadvertently enclosed that do not reflect the nature of this encounter. Every attempt is made to correct such errors.   Tessa Lerner, DO, [facility]  [address] [place], [place] [address] [phone] 06/23/2023 8:33 PM

## 2023-06-23 NOTE — Progress Notes (Addendum)
Pharmacy: Dofetilide (Tikosyn) - Initial Consult Assessment and Electrolyte Replacement  Pharmacy consulted to assist in monitoring and replacing electrolytes in this 57 y.o. male admitted on 06/23/2023 undergoing dofetilide initiation. First dofetilide dose: 500 mcg q 12 hrs - to start once lytes replaced 10/7.  Assessment:  Patient Exclusion Criteria: If any screening criteria checked as "Yes", then  patient  should NOT receive dofetilide until criteria item is corrected.  If "Yes" please indicate correction plan.  YES  NO Patient  Exclusion Criteria Correction Plan   []   [x]   Baseline QTc interval is greater than or equal to 440 msec. IF above YES box checked dofetilide contraindicated unless patient has ICD; then may proceed if QTc 500-550 msec or with known ventricular conduction abnormalities may proceed with QTc 550-600 msec. QTc =  429    []   [x]   Patient is known or suspected to have a digoxin level greater than 2 ng/ml: No results found for: "DIGOXIN"     []   [x]   Creatinine clearance less than 20 ml/min (calculated using Cockcroft-Gault, actual body weight and serum creatinine): Estimated Creatinine Clearance: 169.1 mL/min (by C-G formula based on SCr of 0.86 mg/dL).     []   [x]  Patient has received drugs known to prolong the QT intervals within the last 48 hours (phenothiazines, tricyclics or tetracyclic antidepressants, erythromycin, H-1 antihistamines, cisapride, fluoroquinolones, azithromycin, ondansetron).   Updated information on QT prolonging agents is available to be searched on the following database:QT prolonging agents     []   [x]   Patient received a dose of hydrochlorothiazide (Oretic) alone or in any combination including triamterene (Dyazide, Maxzide) in the last 48 hours.    []   [x]  Patient received a medication known to increase dofetilide plasma concentrations prior to initial dofetilide dose:  Trimethoprim (Primsol, Proloprim) in the last 36  hours Verapamil (Calan, Verelan) in the last 36 hours or a sustained release dose in the last 72 hours Megestrol (Megace) in the last 5 days  Cimetidine (Tagamet) in the last 6 hours Ketoconazole (Nizoral) in the last 24 hours Itraconazole (Sporanox) in the last 48 hours  Prochlorperazine (Compazine) in the last 36 hours     []   [x]   Patient is known to have a history of torsades de pointes; congenital or acquired long QT syndromes.    []   [x]   Patient has received a Class 1 antiarrhythmic with less than 2 half-lives since last dose. (Disopyramide, Quinidine, Procainamide, Lidocaine, Mexiletine, Flecainide, Propafenone)    []   [x]   Patient has received amiodarone therapy in the past 3 months or amiodarone level is greater than 0.3 ng/ml.    Labs:    Component Value Date/Time   K 3.6 06/23/2023 1129   MG 1.9 06/23/2023 1129     Plan: Select One Calculated CrCl  Dose q12h  [x]  > 60 ml/min 500 mcg  []  40-60 ml/min 250 mcg  []  20-40 ml/min 125 mcg   [x]   Physician selected initial dose within range recommended for patients level of renal function - will monitor for response.  []   Physician selected initial dose outside of range recommended for patients level of renal function - will discuss if the dose should be altered at this time.   Patient has been appropriately anticoagulated with Xarelto.  Potassium: K 3.5-3.7:  Hold Tikosyn initiation and give KCl 60 mEq po x1 and repeat BMET 2hr after dose - repeat appropriate dose if K < 4    Magnesium: Mg 1.8-2: Give Mg  2 gm IV x1 to prevent Mg from dropping below 1.8 - do not need to recheck Mg. Appropriate to initiate Tikosyn   Thank you for allowing pharmacy to participate in this patient's care    Reece Leader, Colon Flattery, [facility] Clinical Pharmacist  06/23/2023 4:57 PM   [facility] pharmacy phone numbers are listed on [email or web address]  Addendum: Repeat K after supplementation is 4.5.  Okay for [employer] administration.  Reece Leader, Colon Flattery, BCCP Clinical Pharmacist  06/23/2023 8:37 PM   [facility] pharmacy phone numbers are listed on [email or web address]

## 2023-06-23 NOTE — Progress Notes (Signed)
Primary Care Physician: Dois Davenport, MD Primary Cardiologist: Dr Rennis Golden  Primary Electrophysiologist: none Referring Physician: Edd Fabian NP   Charles Barnett is a 57 y.o. male with a history of morbid obesity, OSA, atrial flutter, HTN, chronic back pain s/p spinal stimulator who presents for follow up in the [facility].  The patient was initially diagnosed with atrial flutter in 2018 after presenting to urgent care with symptoms of SOB and fatigue. He was started on Xarelto for a CHADS2VASC score of 1 and underwent DCCV on 01/30/17. He was found to be back in atrial flutter on follow up 06/04/21 and had another DCCV on 06/27/21. Unfortunately, he had quick return of his atrial flutter. He was started on Multaq and had repeat DCCV on 07/25/21. Patient is s/p DCCV on 05/09/22.   Patient is s/p DCCV x 4 on 05/28/23 which was unsuccessful. Multaq discontinued.   On follow up today, patient presents for dofetilide admission. He remains in afib with symptoms of SOB and fatigue. When his heart rate gets >100 bpm he starts to have chest discomfort. No missed doses of anticoagulation in the past 3 weeks.   Today, he denies symptoms of palpitations, SOB, chest pain, orthopnea, PND, lower extremity edema, dizziness, presyncope, syncope, bleeding, or neurologic sequela. The patient is tolerating medications without difficulties and is otherwise without complaint today.    Atrial Fibrillation Risk Factors:  he does have symptoms or diagnosis of sleep apnea. he is compliant with CPAP therapy. he does not have a history of rheumatic fever.   Atrial Fibrillation Management history:  Previous antiarrhythmic drugs: Multaq Previous cardioversions: 01/30/17, 06/27/21, 07/25/21, 05/09/22, 05/28/23 Previous ablations: none Anticoagulation history: Xarelto    Past Medical History:  Diagnosis Date   Arthritis    Atrial flutter (HCC) 12/2016   Hypertension     Neuromuscular disorder (HCC)    Sleep apnea     Current Outpatient Medications  Medication Sig Dispense Refill   albuterol (VENTOLIN HFA) 108 (90 Base) MCG/ACT inhaler Inhale 1 puff into the lungs every 4 (four) hours as needed. 6.7 g 0   allopurinol (ZYLOPRIM) 100 MG tablet Take 1 tablet (100 mg total) by mouth daily for 14 days, THEN 2 tablets (200 mg total) daily for 14 days, THEN 3 tablets (300 mg total) daily for 14 days. 84 tablet 0   amLODipine (NORVASC) 5 MG tablet Take 1 & 1/2 tablets (7.5 mg total) by mouth daily. 45 tablet 3   Ascorbic Acid (VITAMIN C PO) Take 1 tablet by mouth daily as needed (immune support/onset cold symptoms).     baclofen (LIORESAL) 10 MG tablet Take 10 mg by mouth 3 (three) times daily as needed for muscle spasms.     ergocalciferol (VITAMIN D2) 1.25 MG (50000 UT) capsule Take 2 capsules (100,000 Units total) by mouth 2 (two) times a week. 24 capsule 3   furosemide (LASIX) 20 MG tablet Take 1 tablet (20 mg total) by mouth daily as needed (swelling/wt gain). 10 tablet 0   ipratropium-albuterol (DUONEB) 0.5-2.5 (3) MG/3ML SOLN Take 3 mLs (1 vial) by nebulization every 6 (six) hours as needed. 90 mL 0   metoprolol tartrate (LOPRESSOR) 50 MG tablet Take 1 tablet (50 mg total) by mouth 2 (two) times daily with food. 180 tablet 2   montelukast (SINGULAIR) 10 MG tablet Take 1 tablet (10 mg total) by mouth every evening. 90 tablet 0   naproxen sodium (ALEVE) 220 MG tablet Take 440  mg by mouth 2 (two) times a week.     rivaroxaban (XARELTO) 20 MG TABS tablet Take 1 tablet (20 mg total) by mouth daily with food. 90 tablet 3   No current facility-administered medications for this encounter.    ROS- All systems are reviewed and negative except as per the HPI above.  Physical Exam: Vitals:   06/23/23 1137  BP: (!) 138/90  Pulse: 81  Weight: (!) 214.5 kg  Height: 6' (1.829 m)    GEN: Well nourished, well developed in no acute distress NECK: No JVD; No carotid  bruits CARDIAC: Irregularly irregular rate and rhythm, no murmurs, rubs, gallops RESPIRATORY:  Clear to auscultation without rales, wheezing or rhonchi  ABDOMEN: Soft, non-tender, non-distended EXTREMITIES:  No edema; No deformity    Wt Readings from Last 3 Encounters:  06/23/23 (!) 214.5 kg  06/11/23 (!) 219.7 kg  05/28/23 (!) 214.6 kg    EKG today demonstrates  Afib Vent. rate 81 BPM PR interval * ms QRS duration 102 ms QT/QTcB 370/429 ms   Echo 01/08/17 demonstrated  - Left ventricle: The cavity size was normal. Wall thickness was    increased in a pattern of mild LVH. Systolic function was normal.    The estimated ejection fraction was in the range of 55% to 60%.    Indeterminant diastolic function (atrial fibrillation). Although    no diagnostic regional wall motion abnormality was identified,    this possibility cannot be completely excluded on the basis of    this study.  - Ventricular septum: Mildly D-shaped interventricular septum,    suggestive of RV pressure/volume overload.  - Aortic valve: There was no stenosis.  - Mitral valve: There was no significant regurgitation.  - Right ventricle: The cavity size was mildly to moderately    dilated. Systolic function was normal.  - Right atrium: The atrium was mildly dilated.  - Atrial septum: Atrial septal aneurysm noted.  - Pulmonary arteries: No complete TR doppler jet so unable to    estimate PA systolic pressure.  - Systemic veins: IVC was not visualized.   Impressions:   - Technically difficult study with poor acoustic windows. Normal LV    size with mild LV hypertrophy. EF 55-60%. Mildly D-shaped    interventricular septum is suggestive of a degree of RV    pressure/volume overload. Mild to moderate RV dilation with    normal systolic function. Unable to estimate PA systolic pressure    on this study.   Epic records are reviewed at length today  CHA2DS2-VASc Score = 1  The patient's score is based  upon: CHF History: 0 HTN History: 1 Diabetes History: 0 Stroke History: 0 Vascular Disease History: 0 Age Score: 0 Gender Score: 0        ASSESSMENT AND PLAN: Persistent atrial fibrillation/Atrial flutter The patient's CHA2DS2-VASc score is 1, indicating a 0.6% annual risk of stroke.   Would avoid class IC with LAFB and inc RBBB on ECG. Failed DCCV on Multaq after 4 shocks.  Patient presents for dofetilide admission. Continue Xarelto 20 mg daily, states no missed doses in the last 3 weeks. No recent benadryl use PharmD has screened medications QTc in SR 444 ms Labs today show creatinine at 0.86, K+ 3.6 and mag 1.9, CrCl calculated at 284 mL/min Continue Lopressor 50 mg BID Kardia mobile for home monitoring.   Obesity Body mass index is 64.12 kg/m.  Encouraged lifestyle modification Suspect this is significantly contributing the persistence of his arrhythmias.  OSA  Encouraged nightly CPAP  HTN Stable on current regimen   To be admitted later today once a bed becomes available.     Jorja Loa PA-C [facility] [address] [place], [place] [address] [phone] 06/23/2023 12:04 PM

## 2023-06-23 NOTE — Addendum Note (Signed)
Encounter addended by: Shona Simpson, RN on: 06/23/2023 1:21 PM  Actions taken: Pharmacy for encounter modified, Order list changed

## 2023-06-24 ENCOUNTER — Other Ambulatory Visit (HOSPITAL_BASED_OUTPATIENT_CLINIC_OR_DEPARTMENT_OTHER): Payer: Self-pay

## 2023-06-24 ENCOUNTER — Other Ambulatory Visit (HOSPITAL_COMMUNITY): Payer: Self-pay | Admitting: Physician Assistant

## 2023-06-24 ENCOUNTER — Other Ambulatory Visit (HOSPITAL_COMMUNITY): Payer: Self-pay

## 2023-06-24 ENCOUNTER — Encounter (HOSPITAL_COMMUNITY): Payer: Self-pay | Admitting: Anesthesiology

## 2023-06-24 DIAGNOSIS — I4819 Other persistent atrial fibrillation: Secondary | ICD-10-CM | POA: Diagnosis not present

## 2023-06-24 LAB — BASIC METABOLIC PANEL
Anion gap: 8 (ref 5–15)
BUN: 10 mg/dL (ref 6–20)
CO2: 28 mmol/L (ref 22–32)
Calcium: 10.4 mg/dL — ABNORMAL HIGH (ref 8.9–10.3)
Chloride: 104 mmol/L (ref 98–111)
Creatinine, Ser: 0.96 mg/dL (ref 0.61–1.24)
GFR, Estimated: >60 mL/min (ref >60)
Glucose, Bld: 101 mg/dL — ABNORMAL HIGH (ref 70–99)
Potassium: 4.3 mmol/L (ref 3.5–5.1)
Sodium: 140 mmol/L (ref 135–145)

## 2023-06-24 LAB — MAGNESIUM: Magnesium: 2.1 mg/dL (ref 1.7–2.4)

## 2023-06-24 MED ORDER — SODIUM CHLORIDE 0.9 % IV SOLN: INTRAVENOUS | Status: DC

## 2023-06-24 MED ORDER — MELATONIN 3 MG PO TABS
3.0000 mg | ORAL_TABLET | Freq: Every day | ORAL | Status: DC
  Administered 2023-06-24 – 2023-06-25 (×2): 3 mg via ORAL
  Filled 2023-06-24 (×2): qty 1

## 2023-06-24 NOTE — TOC Benefit Eligibility Note (Signed)
Patient Product/process development scientist completed.    The patient is insured through [employer]. Patient has [employer], may use a copay card, and/or apply for patient assistance if available.    Ran test claim for dofetilide (Tikosyn) 500 mcg capsule and the current 30 day co-pay is $5.00.   This test claim was processed through [employer]- copay amounts may vary at other pharmacies due to pharmacy/plan contracts, or as the patient moves through the different stages of their insurance plan.     Roland Earl, CPHT Pharmacy Technician III Certified Patient Advocate [facility] Pharmacy Patient Advocate Team Direct Number: [phone]  Fax: [phone]

## 2023-06-24 NOTE — Progress Notes (Signed)
Pt requesting something for sleep. Provider on call paged via amion.

## 2023-06-24 NOTE — Progress Notes (Signed)
Morning EKG reviewed     Shows  Atrial Flutter with variable conduction  at 69 bpm with stable QTc at 387-400 ms.  Continue  Tikosyn 500 mcg BID.   Potassium4.3 (10/08 0427) Magnesium  2.1 (10/08 0427) Creatinine, ser  0.96 (10/08 0427)  Pt will be NPO after midnight for DCCV if remains in afib      Canary Brim, MSN, APRN, NP-C, AGACNP-BC [facility] - Electrophysiology  06/24/2023, 2:08 PM

## 2023-06-24 NOTE — Anesthesia Preprocedure Evaluation (Signed)
Anesthesia Evaluation  Patient identified by MRN, date of birth, ID band Patient awake    Reviewed: Allergy & Precautions, NPO status , Patient's Chart, lab work & pertinent test results  Airway        Dental   Pulmonary sleep apnea           Cardiovascular hypertension, Pt. on medications and Pt. on home beta blockers + dysrhythmias Atrial Fibrillation   TTE 01/08/2017: Study Conclusions   - Left ventricle: The cavity size was normal. Wall thickness was    increased in a pattern of mild LVH. Systolic function was normal.    The estimated ejection fraction was in the range of 55% to 60%.    Indeterminant diastolic function (atrial fibrillation). Although    no diagnostic regional wall motion abnormality was identified,    this possibility cannot be completely excluded on the basis of    this study.  - Ventricular septum: Mildly D-shaped interventricular septum,    suggestive of RV pressure/volume overload.  - Aortic valve: There was no stenosis.  - Mitral valve: There was no significant regurgitation.  - Right ventricle: The cavity size was mildly to moderately    dilated. Systolic function was normal.  - Right atrium: The atrium was mildly dilated.  - Atrial septum: Atrial septal aneurysm noted.  - Pulmonary arteries: No complete TR doppler jet so unable to    estimate PA systolic pressure.  - Systemic veins: IVC was not visualized.     Neuro/Psych  Neuromuscular disease    GI/Hepatic   Endo/Other    Morbid obesityhyperparathyroidism  Renal/GU      Musculoskeletal  (+) Arthritis ,    Abdominal   Peds  Hematology   Anesthesia Other Findings Last Xarelto:  Reproductive/Obstetrics                             Anesthesia Physical Anesthesia Plan  ASA: 4  Anesthesia Plan: General   Post-op Pain Management:    Induction: Intravenous  PONV Risk Score and Plan: 2 and Treatment may  vary due to age or medical condition  Airway Management Planned: Natural Airway and Nasal Cannula  Additional Equipment:   Intra-op Plan:   Post-operative Plan:   Informed Consent:   Plan Discussed with: Anesthesiologist and CRNA  Anesthesia Plan Comments: (Discussed with patient risks of MAC including, but not limited to, minor pain or discomfort, hearing people in the room, and possible need for backup general anesthesia. Risks for general anesthesia also discussed including, but not limited to, sore throat, hoarse voice, chipped/damaged teeth, injury to vocal cords, nausea and vomiting, allergic reactions, lung infection, heart attack, stroke, and death. All questions answered. )       Anesthesia Quick Evaluation

## 2023-06-24 NOTE — TOC CM/SW Note (Addendum)
Transition of Care [facility]) - Inpatient Brief Assessment   Patient Details  Name: Charles Barnett MRN: [id number] Date of Birth: 11-Oct-1965  Transition of Care [facility]) CM/SW Contact:    Gala Lewandowsky, RN Phone Number: 06/24/2023, 10:11 AM   Clinical Narrative: Transition of Care Department [facility]) has reviewed the patient. Patient presented for Tikosyn Load. Benefits check completed for cost $5.00. Case Manager will discuss cost and pharmacy of choice as the patient progresses.   Transition of Care Asessment: Insurance and Status: Insurance coverage has been reviewed Patient has primary care physician: Yes Prior/Current Home Services: No current home services Social Determinants of Health Reivew: SDOH reviewed no interventions necessary Readmission risk has been reviewed: Yes Transition of care needs: transition of care needs identified, TOC will continue to follow

## 2023-06-24 NOTE — Progress Notes (Signed)
   Electrophysiology Rounding Note  Patient Name: Charles Barnett Date of Encounter: 06/24/2023  Primary Cardiologist: Chrystie Nose, MD  Electrophysiologist: None    Subjective   Pt remains in afib on Tikosyn 500 mcg BID   QTc from EKG last pm shows stable QTc at 420-457 (AF)  The patient reports he didn't sleep well overnight due to back pain.  At this time, the patient denies chest pain, shortness of breath, or any new concerns.  Inpatient Medications    Scheduled Meds:  allopurinol  300 mg Oral Daily   amLODipine  7.5 mg Oral Daily   dofetilide  500 mcg Oral BID   metoprolol tartrate  50 mg Oral BID   montelukast  10 mg Oral QPM   rivaroxaban  20 mg Oral Q supper   sodium chloride flush  3 mL Intravenous Q12H   Continuous Infusions:  sodium chloride     PRN Meds: sodium chloride, albuterol, baclofen, furosemide, ipratropium-albuterol, sodium chloride flush   Vital Signs    Vitals:   06/24/23 0014 06/24/23 0421 06/24/23 0741 06/24/23 0833  BP: (!) 131/94 (!) 143/103  (!) 137/94  Pulse: 70 73  83  Resp: 18 20 18    Temp: 98.6 F (37 C) 98.3 F (36.8 C) 98.4 F (36.9 C)   TempSrc: Oral Oral Oral   Weight:      Height:       No intake or output data in the 24 hours ending 06/24/23 0904 Filed Weights   06/23/23 1639  Weight: (!) 214.4 kg    Physical Exam    GEN- NAD, A&O x 3. Normal affect.  Lungs- CTAB, Normal effort.  Heart- Irregularly irregular rate and rhythm. No M/G/R GI- Soft, NT, ND Extremities- No clubbing, cyanosis, or edema Skin- no rash or lesion  Labs    CBC No results for input(s): "WBC", "NEUTROABS", "HGB", "HCT", "MCV", "PLT" in the last 72 hours. Basic Metabolic Panel Recent Labs    16/10/96 1129 06/23/23 1857 06/24/23 0427  NA 138 139 140  K 3.6 4.5 4.3  CL 105 103 104  CO2 21* 27 28  GLUCOSE 148* 101* 101*  BUN 7 8 10   CREATININE 0.86 1.04 0.96  CALCIUM 10.1 10.3 10.4*  MG 1.9  --  2.1    Telemetry    Coarse AF  70-80's (personally reviewed)  Patient Profile     Charles Barnett is a 57 y.o. male with a past medical history significant for persistent atrial fibrillation.  They were admitted for tikosyn load.   Assessment & Plan     Persistent atrial fibrillation Pt remains in afib on Tikosyn 500 mcg BID  Continue Xarelto Creatinine, ser  0.96 (10/08 0427) Magnesium  2.1 (10/08 0427) Potassium4.3 (10/08 0427) No electrolyte supplementation needed  Obesity /  BMI 64  -would preclude him as an ablation candidate   If pt does not convert chemically, plan on DCCV Wednesday       For questions or updates, please contact [name] Please consult [email or web address] for contact info under Cardiology/STEMI.  Signed, Canary Brim, MSN, APRN, NP-C, AGACNP-BC [facility] - Electrophysiology  06/24/2023, 9:06 AM

## 2023-06-24 NOTE — Progress Notes (Signed)
Pharmacy: Dofetilide (Tikosyn) - Follow Up Assessment and Electrolyte Replacement  Pharmacy consulted to assist in monitoring and replacing electrolytes in this 57 y.o. male admitted on 06/23/2023 undergoing dofetilide initiation. First dofetilide dose: 500 mcg 10/7 PM  Labs:    Component Value Date/Time   K 4.3 06/24/2023 0427   MG 2.1 06/24/2023 0427     Plan: Potassium: K >/= 4: No additional supplementation needed  Magnesium: Mg > 2: No additional supplementation needed   Thank you for allowing pharmacy to participate in this patient's care   Trixie Rude, PharmD Clinical Pharmacist 06/24/2023  6:55 AM

## 2023-06-25 ENCOUNTER — Encounter (HOSPITAL_COMMUNITY)
Admission: AD | Disposition: A | Discharge status: Home / Self Care | Payer: Self-pay | Source: Ambulatory Visit | Attending: Cardiology

## 2023-06-25 DIAGNOSIS — I4819 Other persistent atrial fibrillation: Secondary | ICD-10-CM | POA: Diagnosis not present

## 2023-06-25 LAB — BASIC METABOLIC PANEL
Anion gap: 7 (ref 5–15)
BUN: 12 mg/dL (ref 6–20)
CO2: 28 mmol/L (ref 22–32)
Calcium: 10.2 mg/dL (ref 8.9–10.3)
Chloride: 104 mmol/L (ref 98–111)
Creatinine, Ser: 0.86 mg/dL (ref 0.61–1.24)
GFR, Estimated: >60 mL/min (ref >60)
Glucose, Bld: 96 mg/dL (ref 70–99)
Potassium: 4.1 mmol/L (ref 3.5–5.1)
Sodium: 139 mmol/L (ref 135–145)

## 2023-06-25 LAB — MAGNESIUM: Magnesium: 2 mg/dL (ref 1.7–2.4)

## 2023-06-25 SURGERY — CARDIOVERSION
Anesthesia: General

## 2023-06-25 MED ORDER — MAGNESIUM SULFATE 2 GM/50ML IV SOLN
2.0000 g | Freq: Once | INTRAVENOUS | Status: AC
  Administered 2023-06-25: 2 g via INTRAVENOUS
  Filled 2023-06-25: qty 50

## 2023-06-25 MED ORDER — METOPROLOL SUCCINATE ER 50 MG PO TB24
50.0000 mg | ORAL_TABLET | Freq: Every day | ORAL | Status: DC
  Administered 2023-06-26: 50 mg via ORAL
  Filled 2023-06-25: qty 1

## 2023-06-25 NOTE — Progress Notes (Addendum)
Morning EKG reviewed     Shows remains in NSR at 62 bpm with  difficult to interpret  QTc due to profound U wave.   In some leads, QT appears to be ~440-460 or perhaps even better.  Will clarify appropriate dosing for this evening with MD.   ADDENDUM OK to continue 500 mcg dose for tonight per Dr. Jimmey Ralph   Potassium4.1 (10/09 4098) Magnesium  2.0 (10/09 0416) Creatinine, ser  0.86 (10/09 0416)  Plan for home Thursday if QTc remains stable   Graciella Freer, PA-C  06/25/2023 10:55 AM

## 2023-06-25 NOTE — Care Management (Signed)
1150 06-25-23 Patient presented for Tikosyn Load. Case Manager spoke with the patient regarding co pay cost. Patient is agreeable to cost and would like to have the initial Rx filled via [facility] and the Rx refills 90 day supply escribed to [facility]. No further needs identified at this time.

## 2023-06-25 NOTE — Progress Notes (Addendum)
   Electrophysiology Rounding Note  Patient Name: Charles Barnett Date of Encounter: 06/25/2023  Primary Cardiologist: Charles Nose, MD  Electrophysiologist: None    Subjective   Pt converted to sinus rhythm on Tikosyn 500 mcg BID   QTc from EKG last pm shows stable QTc at 456 ms  The patient is doing well today.  At this time, the patient denies chest pain, shortness of breath, or any new concerns.  Inpatient Medications    Scheduled Meds:  allopurinol  300 mg Oral Daily   amLODipine  7.5 mg Oral Daily   dofetilide  500 mcg Oral BID   melatonin  3 mg Oral QHS   metoprolol tartrate  50 mg Oral BID   montelukast  10 mg Oral QPM   rivaroxaban  20 mg Oral Q supper   sodium chloride flush  3 mL Intravenous Q12H   Continuous Infusions:  sodium chloride     sodium chloride     PRN Meds: sodium chloride, albuterol, baclofen, furosemide, ipratropium-albuterol, sodium chloride flush   Vital Signs    Vitals:   06/24/23 2020 06/25/23 0418 06/25/23 0747 06/25/23 0809  BP: (!) 159/84 (!) 141/100  123/73  Pulse:  60  66  Resp:   18   Temp:  97.6 F (36.4 C) 97.6 F (36.4 C)   TempSrc:  Oral Oral   SpO2:      Weight:      Height:       No intake or output data in the 24 hours ending 06/25/23 0848 Filed Weights   06/23/23 1639  Weight: (!) 214.4 kg    Physical Exam    GEN- NAD, A&O x 3. Normal affect.  Lungs- CTAB, Normal effort.  Heart- Regular rate and rhythm. No M/G/R GI- Soft, NT, ND Extremities- No clubbing, cyanosis, or edema Skin- no rash or lesion  Labs    CBC No results for input(s): "WBC", "NEUTROABS", "HGB", "HCT", "MCV", "PLT" in the last 72 hours. Basic Metabolic Panel Recent Labs    16/10/96 0427 06/25/23 0416  NA 140 139  K 4.3 4.1  CL 104 104  CO2 28 28  GLUCOSE 101* 96  BUN 10 12  CREATININE 0.96 0.86  CALCIUM 10.4* 10.2  MG 2.1 2.0    Telemetry    SB-SR, 55-60's, converted around 2330 to SR from AF (personally  reviewed)  Patient Profile     Charles Barnett is a 56 y.o. male with a past medical history significant for persistent atrial fibrillation.  They were admitted for tikosyn load. Hx unsuccessful DCCV x4 05/28/23.  Multaq discontinued.   Assessment & Plan    Persistent Atrial Fibrillation Atrial Flutter  Pt converted to sinus rhythm on Tikosyn 500 mcg BID  Continue Xarelto Creatinine, ser  0.86 (10/09 0416) Magnesium  2.0 (10/09 0416) Potassium4.1 (10/09 0416) No electrolyte supplementation needed  Change lopressor BID to Toprol 50 mg every day   Plan for home Thursday if QTc remains stable.      For questions or updates, please contact [name] Please consult [email or web address] for contact info under Cardiology/STEMI.  Signed, Charles Brim, MSN, APRN, NP-C, AGACNP-BC [facility] - Electrophysiology  06/25/2023, 8:51 AM

## 2023-06-25 NOTE — Progress Notes (Signed)
Pharmacy: Dofetilide (Tikosyn) - Follow Up Assessment and Electrolyte Replacement  Pharmacy consulted to assist in monitoring and replacing electrolytes in this 57 y.o. male admitted on 06/23/2023 undergoing dofetilide initiation. First  Labs:    Component Value Date/Time   K 4.1 06/25/2023 0416   MG 2.0 06/25/2023 0416     Plan: Potassium: K >/= 4: No additional supplementation needed  Magnesium: Mg 1.8-2: Give Mg 2 gm IV x1    As patient has required a total of potassium since 10/7, I anticipate no potassium supplementation will be needed at discharge.  Thank you for allowing pharmacy to participate in this patient's care   Harland German, PharmD Clinical Pharmacist **Pharmacist phone directory can now be found on [email or web address] ([email or web address]).  Listed under [facility].

## 2023-06-25 NOTE — Progress Notes (Signed)
Pt converted to SB/SR !st degree HB

## 2023-06-26 ENCOUNTER — Other Ambulatory Visit (HOSPITAL_COMMUNITY): Payer: Self-pay

## 2023-06-26 DIAGNOSIS — I4819 Other persistent atrial fibrillation: Secondary | ICD-10-CM | POA: Diagnosis not present

## 2023-06-26 LAB — BASIC METABOLIC PANEL
Anion gap: 9 (ref 5–15)
BUN: 7 mg/dL (ref 6–20)
CO2: 30 mmol/L (ref 22–32)
Calcium: 10.5 mg/dL — ABNORMAL HIGH (ref 8.9–10.3)
Chloride: 100 mmol/L (ref 98–111)
Creatinine, Ser: 0.88 mg/dL (ref 0.61–1.24)
GFR, Estimated: >60 mL/min (ref >60)
Glucose, Bld: 101 mg/dL — ABNORMAL HIGH (ref 70–99)
Potassium: 4.1 mmol/L (ref 3.5–5.1)
Sodium: 139 mmol/L (ref 135–145)

## 2023-06-26 LAB — MAGNESIUM: Magnesium: 2.2 mg/dL (ref 1.7–2.4)

## 2023-06-26 MED ORDER — METOPROLOL SUCCINATE ER 25 MG PO TB24
25.0000 mg | ORAL_TABLET | Freq: Every day | ORAL | 11 refills | Status: DC
Start: 2023-06-26 — End: 2024-06-14
  Filled 2023-06-26: qty 30, 30d supply, fill #0
  Filled 2023-07-21: qty 30, 30d supply, fill #1
  Filled 2023-08-08: qty 60, 60d supply, fill #2
  Filled 2023-08-12: qty 60, 60d supply, fill #0
  Filled 2023-10-17: qty 30, 30d supply, fill #1
  Filled 2023-11-17: qty 30, 30d supply, fill #2
  Filled 2023-12-16: qty 30, 30d supply, fill #3
  Filled 2024-01-15: qty 30, 30d supply, fill #4
  Filled 2024-02-16: qty 30, 30d supply, fill #5
  Filled 2024-03-15 – 2024-03-26 (×2): qty 30, 30d supply, fill #6
  Filled 2024-04-14 – 2024-04-19 (×2): qty 30, 30d supply, fill #7
  Filled 2024-05-14: qty 30, 30d supply, fill #8

## 2023-06-26 MED ORDER — DOFETILIDE 500 MCG PO CAPS
500.0000 ug | ORAL_CAPSULE | Freq: Two times a day (BID) | ORAL | 6 refills | Status: DC
  Filled 2023-06-26: qty 60, 30d supply, fill #0

## 2023-06-26 NOTE — Progress Notes (Signed)
Discharge teaching complete. Meds, diet,activity, follow up appointments reviewed and all questions answered. Copy of instructions given to patient and meds sent to [facility]. Patient discharged home with wife via wheelchair.

## 2023-06-26 NOTE — Discharge Summary (Signed)
ELECTROPHYSIOLOGY DISCHARGE SUMMARY    Patient ID: Charles Barnett,  MRN: [id number], DOB/AGE: June 18, 1966 57 y.o.  Admit date: 06/23/2023 Discharge date: 06/26/2023  Primary Care Physician: Default, Provider, MD  Primary Cardiologist: Chrystie Nose, MD  Electrophysiologist: Dr. Jimmey Ralph   Primary Discharge Diagnosis:  1.  Persistent atrial fibrillation status post Tikosyn loading this admission  Allergies  Allergen Reactions   Ibuprofen Other (See Comments)    Bloody stool   Iodine Swelling    Including contrast dye      Procedures This Admission:  1.  Tikosyn loading    Brief HPI: Charles Barnett is a 57 y.o. male with a past medical history as noted above.  They were referred to EP for treatment options of atrial fibrillation.  Risks, benefits, and alternatives to Tikosyn were reviewed with the patient who wished to proceed with admission for loading.  Hospital Course:  The patient was admitted and Tikosyn was initiated.  Renal function and electrolytes were followed during the hospitalization.  Their QTc remained stable. The patient converted chemically and did not require cardioversion. The patients QTc remained stable. They were monitored on telemetry up to discharge. On the day of discharge, they were examined by Dr. Jimmey Ralph  who considered them stable for discharge to home.  Follow-up has been arranged with the Atrial Fibrillation clinic in approximately 1 week.   Physical Exam: Vitals:   06/26/23 0553 06/26/23 0832 06/26/23 1035 06/26/23 1047  BP: 131/75 (!) 157/97 (!) 167/88 (!) 167/88  Pulse: 71 72  74  Resp: 16 (!) 22    Temp: (!) 97.4 F (36.3 C) 98.7 F (37.1 C)    TempSrc: Oral Oral    SpO2: 97%     Weight:      Height:        GEN- NAD, A&O x 3. Normal affect.  Lungs- CTAB, Normal effort.  Heart- Regular rate and rhythm. No M/G/R GI- Soft, NT, ND Extremities- No clubbing, cyanosis, or edema Skin- no rash or lesion  Labs:   Lab Results   Component Value Date   WBC 9.0 05/27/2023   HGB 14.0 05/27/2023   HCT 46.8 05/27/2023   MCV 84.9 05/27/2023   PLT 227 05/27/2023    Recent Labs  Lab 06/26/23 0453  NA 139  K 4.1  CL 100  CO2 30  BUN 7  CREATININE 0.88  CALCIUM 10.5*  GLUCOSE 101*    Discharge Medications:  Allergies as of 06/26/2023       Reactions   Ibuprofen Other (See Comments)   Bloody stool   Iodine Swelling   Including contrast dye         Medication List     STOP taking these medications    metoprolol tartrate 50 MG tablet Commonly known as: LOPRESSOR       TAKE these medications    albuterol 108 (90 Base) MCG/ACT inhaler Commonly known as: VENTOLIN HFA Inhale 1 puff into the lungs every 4 (four) hours as needed.   allopurinol 100 MG tablet Commonly known as: ZYLOPRIM Take 1 tablet (100 mg total) by mouth daily for 14 days, THEN 2 tablets (200 mg total) daily for 14 days, THEN 3 tablets (300 mg total) daily for 14 days. Start taking on: May 20, 2023   amLODipine 5 MG tablet Commonly known as: NORVASC Take 1 & 1/2 tablets (7.5 mg total) by mouth daily.   baclofen 10 MG tablet Commonly known as: LIORESAL Take  10 mg by mouth 3 (three) times daily as needed for muscle spasms.   dofetilide 500 MCG capsule Commonly known as: TIKOSYN Take 1 capsule (500 mcg total) by mouth 2 (two) times daily.   furosemide 20 MG tablet Commonly known as: Lasix Take 1 tablet (20 mg total) by mouth daily as needed (swelling/wt gain).   ipratropium-albuterol 0.5-2.5 (3) MG/3ML Soln Commonly known as: DUONEB Take 3 mLs (1 vial) by nebulization every 6 (six) hours as needed.   metoprolol succinate 25 MG 24 hr tablet Commonly known as: Toprol XL Take 1 tablet (25 mg total) by mouth daily.   montelukast 10 MG tablet Commonly known as: SINGULAIR Take 1 tablet (10 mg total) by mouth every evening.   naproxen sodium 220 MG tablet Commonly known as: ALEVE Take 440 mg by mouth See admin  instructions. Four times a week   potassium chloride SA 20 MEQ tablet Commonly known as: KLOR-CON M Take 2 tablets (40 mEq total) by mouth once for 1 dose.   VITAMIN C PO Take 1 tablet by mouth daily as needed (immune support/onset cold symptoms).   Vitamin D (Ergocalciferol) 1.25 MG (50000 UNIT) Caps capsule Commonly known as: DRISDOL Take 2 capsules (100,000 Units total) by mouth 2 (two) times a week.   Xarelto 20 MG Tabs tablet Generic drug: rivaroxaban Take 1 tablet (20 mg total) by mouth daily with food.        Disposition:  Home with follow up in AF clinic in 1 week as in AVS.   Duration of Discharge Encounter: Greater than 30 minutes including physician time.  Dustin Flock, PA-C  06/26/2023 12:09 PM

## 2023-06-26 NOTE — Progress Notes (Signed)
Pharmacy: Dofetilide (Tikosyn) - Follow Up Assessment and Electrolyte Replacement  Pharmacy consulted to assist in monitoring and replacing electrolytes in this 57 y.o. male admitted on 06/23/2023 undergoing dofetilide initiation.   Labs:    Component Value Date/Time   K 4.1 06/26/2023 0453   MG 2.2 06/26/2023 0453     Plan: Potassium: K >/= 4: No additional supplementation needed  Magnesium: Mg > 2: No additional supplementation needed   He has required a total of potassium since 10/7. I recommend no potassium supplement at discharge.   Thank you for allowing pharmacy to participate in this patient's care   Harland German, PharmD Clinical Pharmacist **Pharmacist phone directory can now be found on [email or web address] ([email or web address]).  Listed under [facility].

## 2023-06-26 NOTE — Discharge Instructions (Signed)
 Dofetilide Capsules What is this medication? DOFETILIDE (doe FET il ide) treats a fast or irregular heartbeat (arrhythmia). It works by slowing down overactive electric signals in the heart, which stabilizes your heart rhythm. It belongs to a group of medications called antiarrhythmics. This medicine may be used for other purposes; ask your health care provider or pharmacist if you have questions. COMMON BRAND NAME(S): Tikosyn What should I tell my care team before I take this medication? They need to know if you have any of these conditions: Heart disease History of irregular heartbeat History of low levels of potassium or magnesium in the blood Kidney disease Liver disease An unusual or allergic reaction to dofetilide, other medications, foods, dyes, or preservatives Pregnant or trying to get pregnant Breast-feeding How should I use this medication? Take this medication by mouth with a glass of water. Follow the directions on the prescription label. Do not take with grapefruit juice. You can take it with or without food. If it upsets your stomach, take it with food. Take your medication at regular intervals. Do not take it more often than directed. Do not stop taking except on your care team's advice. A special MedGuide will be given to you by the pharmacist with each prescription and refill. Be sure to read this information carefully each time. Talk to your care team about the use of this medication in children. Special care may be needed. Overdosage: If you think you have taken too much of this medicine contact a poison control center or emergency room at once. NOTE: This medicine is only for you. Do not share this medicine with others. What if I miss a dose? If you miss a dose, skip it. Take your next dose at the normal time. Do not take extra or 2 doses at the same time to make up for the missed dose. What may interact with this medication? Do not take this medication with any of the  following: Benadryl (Diphenhydramine) Cimetidine Cisapride Dolutegravir Dronedarone Erdafitinib Hydrochlorothiazide Immodium Ketoconazole Megestrol Pimozide Prochlorperazine Thioridazine Trimethoprim Verapamil This medication may also interact with the following: Amiloride Cannabinoids Certain antibiotics like erythromycin or clarithromycin Certain antiviral medications for HIV or hepatitis Certain medications for depression, anxiety, or psychotic disorders Digoxin Diltiazem Grapefruit juice Metformin Nefazodone Other medications that prolong the QT interval (an abnormal heart rhythm) Quinine Triamterene Zafirlukast Ziprasidone This list may not describe all possible interactions. Give your health care provider a list of all the medicines, herbs, non-prescription drugs, or dietary supplements you use. Also tell them if you smoke, drink alcohol, or use illegal drugs. Some items may interact with your medicine. What should I watch for while using this medication? Your condition will be monitored carefully while you are receiving this medication. What side effects may I notice from receiving this medication? Side effects that you should report to your care team as soon as possible: Allergic reactions--skin rash, itching, hives, swelling of the face, lips, tongue, or throat Chest pain Heart rhythm changes--fast or irregular heartbeat, dizziness, feeling faint or lightheaded, chest pain, trouble breathing Side effects that usually do not require medical attention (report to your care team if they continue or are bothersome): Dizziness Headache Nausea Stomach pain Trouble sleeping This list may not describe all possible side effects. Call your doctor for medical advice about side effects. You may report side effects to FDA at 1-800-FDA-1088. Where should I keep my medication? Keep out of the reach of children. Store at room temperature between 15 and 30 degrees  C (59 and 86  degrees F). Throw away any unused medication after the expiration date. NOTE: This sheet is a summary. It may not cover all possible information. If you have questions about this medicine, talk to your doctor, pharmacist, or health care provider.  2024 Elsevier/Gold Standard (2021-08-03 00:00:00)

## 2023-06-26 NOTE — Progress Notes (Signed)
EKG from yesterday evening 06/25/2023 reviewed     Shows remains in NSR with stable QTc at ~440-460 ms when measured manually  Continue  Tikosyn 500 mcg BID.   Potassium4.1 (10/10 0453) Magnesium  2.2 (10/10 0453) Creatinine, ser  0.88 (10/10 0453)  Plan for home this afternoon if QTc remains stable.   Graciella Freer, PA-C  06/26/2023 7:11 AM

## 2023-06-28 DIAGNOSIS — G4733 Obstructive sleep apnea (adult) (pediatric): Secondary | ICD-10-CM | POA: Diagnosis not present

## 2023-07-01 ENCOUNTER — Other Ambulatory Visit (HOSPITAL_BASED_OUTPATIENT_CLINIC_OR_DEPARTMENT_OTHER): Payer: Self-pay

## 2023-07-02 ENCOUNTER — Other Ambulatory Visit: Payer: Self-pay

## 2023-07-02 ENCOUNTER — Ambulatory Visit (HOSPITAL_COMMUNITY)
Admission: RE | Admit: 2023-07-02 | Discharge: 2023-07-02 | Disposition: A | Discharge status: Home / Self Care | Payer: Commercial Managed Care - PPO | Source: Ambulatory Visit | Attending: Physician Assistant | Admitting: Physician Assistant

## 2023-07-02 ENCOUNTER — Other Ambulatory Visit (HOSPITAL_BASED_OUTPATIENT_CLINIC_OR_DEPARTMENT_OTHER): Payer: Self-pay

## 2023-07-02 VITALS — BP 140/80 | HR 72 | Ht 72.0 in | Wt >= 6400 oz

## 2023-07-02 DIAGNOSIS — Z6841 Body Mass Index (BMI) 40.0 and over, adult: Secondary | ICD-10-CM | POA: Insufficient documentation

## 2023-07-02 DIAGNOSIS — I10 Essential (primary) hypertension: Secondary | ICD-10-CM | POA: Diagnosis not present

## 2023-07-02 DIAGNOSIS — I4819 Other persistent atrial fibrillation: Secondary | ICD-10-CM | POA: Diagnosis not present

## 2023-07-02 DIAGNOSIS — Z5181 Encounter for therapeutic drug level monitoring: Secondary | ICD-10-CM | POA: Diagnosis not present

## 2023-07-02 DIAGNOSIS — I4892 Unspecified atrial flutter: Secondary | ICD-10-CM | POA: Diagnosis not present

## 2023-07-02 DIAGNOSIS — Z79899 Other long term (current) drug therapy: Secondary | ICD-10-CM | POA: Diagnosis not present

## 2023-07-02 DIAGNOSIS — Z9682 Presence of neurostimulator: Secondary | ICD-10-CM | POA: Insufficient documentation

## 2023-07-02 DIAGNOSIS — G4733 Obstructive sleep apnea (adult) (pediatric): Secondary | ICD-10-CM | POA: Diagnosis not present

## 2023-07-02 DIAGNOSIS — Z7901 Long term (current) use of anticoagulants: Secondary | ICD-10-CM | POA: Diagnosis not present

## 2023-07-02 DIAGNOSIS — I4891 Unspecified atrial fibrillation: Secondary | ICD-10-CM | POA: Diagnosis present

## 2023-07-02 LAB — BASIC METABOLIC PANEL
Anion gap: 6 (ref 5–15)
BUN: 8 mg/dL (ref 6–20)
CO2: 29 mmol/L (ref 22–32)
Calcium: 10.4 mg/dL — ABNORMAL HIGH (ref 8.9–10.3)
Chloride: 105 mmol/L (ref 98–111)
Creatinine, Ser: 0.82 mg/dL (ref 0.61–1.24)
GFR, Estimated: >60 mL/min (ref >60)
Glucose, Bld: 96 mg/dL (ref 70–99)
Potassium: 4.7 mmol/L (ref 3.5–5.1)
Sodium: 140 mmol/L (ref 135–145)

## 2023-07-02 LAB — MAGNESIUM: Magnesium: 2 mg/dL (ref 1.7–2.4)

## 2023-07-02 MED ORDER — ALLOPURINOL 100 MG PO TABS
ORAL_TABLET | ORAL | 0 refills | Status: DC
  Filled 2023-07-02: qty 84, 42d supply, fill #0

## 2023-07-02 MED ORDER — DOFETILIDE 500 MCG PO CAPS
500.0000 ug | ORAL_CAPSULE | Freq: Two times a day (BID) | ORAL | 1 refills | Status: DC
  Filled 2023-07-02: qty 180, 90d supply, fill #0
  Filled 2023-07-25: qty 60, 30d supply, fill #0
  Filled 2023-08-08 – 2023-08-13 (×3): qty 120, 60d supply, fill #1
  Filled 2023-10-17: qty 180, 90d supply, fill #2

## 2023-07-02 NOTE — Progress Notes (Signed)
Primary Care Physician: Default, Provider, MD Primary Cardiologist: Dr Rennis Golden  Primary Electrophysiologist: none Referring Physician: Edd Fabian NP   Charles Barnett is a 57 y.o. male with a history of morbid obesity, OSA, atrial flutter, HTN, chronic back pain s/p spinal stimulator who presents for follow up in the [facility].  The patient was initially diagnosed with atrial flutter in 2018 after presenting to urgent care with symptoms of SOB and fatigue. He was started on Xarelto for a CHADS2VASC score of 1 and underwent DCCV on 01/30/17. He was found to be back in atrial flutter on follow up 06/04/21 and had another DCCV on 06/27/21. Unfortunately, he had quick return of his atrial flutter. He was started on Multaq and had repeat DCCV on 07/25/21. Patient is s/p DCCV on 05/09/22.   Patient is s/p DCCV x 4 on 05/28/23 which was unsuccessful. Multaq discontinued.   On follow up today, patient is s/p dofetilide admission. He chemically converted to SR and did not require DCCV. He feels better in SR with more energy and no chest discomfort. No bleeding issues on anticoagulation.   Today, he denies symptoms of palpitations, SOB, chest pain, orthopnea, PND, lower extremity edema, dizziness, presyncope, syncope, bleeding, or neurologic sequela. The patient is tolerating medications without difficulties and is otherwise without complaint today.    Atrial Fibrillation Risk Factors:  he does have symptoms or diagnosis of sleep apnea. he is compliant with CPAP therapy. he does not have a history of rheumatic fever.   Atrial Fibrillation Management history:  Previous antiarrhythmic drugs: Multaq, dofetilide  Previous cardioversions: 01/30/17, 06/27/21, 07/25/21, 05/09/22, 05/28/23 Previous ablations: none Anticoagulation history: Xarelto    Past Medical History:  Diagnosis Date   Arthritis    Atrial flutter (HCC) 12/2016   Hypertension    Neuromuscular disorder  (HCC)    Sleep apnea     Current Outpatient Medications  Medication Sig Dispense Refill   albuterol (VENTOLIN HFA) 108 (90 Base) MCG/ACT inhaler Inhale 1 puff into the lungs every 4 (four) hours as needed. 6.7 g 0   allopurinol (ZYLOPRIM) 100 MG tablet Take 1 tablet (100 mg total) by mouth daily for 14 days, THEN 2 tablets (200 mg total) daily for 14 days, THEN 3 tablets (300 mg total) daily for 14 days. 84 tablet 0   amLODipine (NORVASC) 5 MG tablet Take 1 & 1/2 tablets (7.5 mg total) by mouth daily. 45 tablet 3   Ascorbic Acid (VITAMIN C PO) Take 1 tablet by mouth daily as needed (immune support/onset cold symptoms).     baclofen (LIORESAL) 10 MG tablet Take 10 mg by mouth 3 (three) times daily as needed for muscle spasms.     dofetilide (TIKOSYN) 500 MCG capsule Take 1 capsule (500 mcg total) by mouth 2 (two) times daily. 60 capsule 6   ergocalciferol (VITAMIN D2) 1.25 MG (50000 UT) capsule Take 2 capsules (100,000 Units total) by mouth 2 (two) times a week. 24 capsule 3   furosemide (LASIX) 20 MG tablet Take 1 tablet (20 mg total) by mouth daily as needed (swelling/wt gain). 10 tablet 0   ipratropium-albuterol (DUONEB) 0.5-2.5 (3) MG/3ML SOLN Take 3 mLs (1 vial) by nebulization every 6 (six) hours as needed. 90 mL 0   metoprolol succinate (TOPROL XL) 25 MG 24 hr tablet Take 1 tablet (25 mg total) by mouth daily. 30 tablet 11   montelukast (SINGULAIR) 10 MG tablet Take 1 tablet (10 mg total) by mouth  every evening. 90 tablet 0   naproxen sodium (ALEVE) 220 MG tablet Take 440 mg by mouth See admin instructions. Four times a week     rivaroxaban (XARELTO) 20 MG TABS tablet Take 1 tablet (20 mg total) by mouth daily with food. 90 tablet 3   No current facility-administered medications for this encounter.    ROS- All systems are reviewed and negative except as per the HPI above.  Physical Exam: Vitals:   07/02/23 1526  BP: (!) 140/80  Pulse: 72  Weight: (!) 213.4 kg  Height: 6' (1.829  m)     GEN: Well nourished, well developed in no acute distress NECK: No JVD; No carotid bruits CARDIAC: Regular rate and rhythm, no murmurs, rubs, gallops RESPIRATORY:  Clear to auscultation without rales, wheezing or rhonchi  ABDOMEN: Soft, non-tender, non-distended EXTREMITIES:  No edema; No deformity    Wt Readings from Last 3 Encounters:  07/02/23 (!) 213.4 kg  06/23/23 (!) 214.4 kg  06/23/23 (!) 214.5 kg    EKG today demonstrates  SR, 1st degree AV block Vent. rate 72 BPM PR interval 244 ms QRS duration 104 ms QT/QTcB 420/459 ms   Echo 01/08/17 demonstrated  - Left ventricle: The cavity size was normal. Wall thickness was    increased in a pattern of mild LVH. Systolic function was normal.    The estimated ejection fraction was in the range of 55% to 60%.    Indeterminant diastolic function (atrial fibrillation). Although    no diagnostic regional wall motion abnormality was identified,    this possibility cannot be completely excluded on the basis of    this study.  - Ventricular septum: Mildly D-shaped interventricular septum,    suggestive of RV pressure/volume overload.  - Aortic valve: There was no stenosis.  - Mitral valve: There was no significant regurgitation.  - Right ventricle: The cavity size was mildly to moderately    dilated. Systolic function was normal.  - Right atrium: The atrium was mildly dilated.  - Atrial septum: Atrial septal aneurysm noted.  - Pulmonary arteries: No complete TR doppler jet so unable to    estimate PA systolic pressure.  - Systemic veins: IVC was not visualized.   Impressions:   - Technically difficult study with poor acoustic windows. Normal LV    size with mild LV hypertrophy. EF 55-60%. Mildly D-shaped    interventricular septum is suggestive of a degree of RV    pressure/volume overload. Mild to moderate RV dilation with    normal systolic function. Unable to estimate PA systolic pressure    on this study.   Epic  records are reviewed at length today  CHA2DS2-VASc Score = 1  The patient's score is based upon: CHF History: 0 HTN History: 1 Diabetes History: 0 Stroke History: 0 Vascular Disease History: 0 Age Score: 0 Gender Score: 0        ASSESSMENT AND PLAN: Persistent atrial fibrillation/Atrial flutter The patient's CHA2DS2-VASc score is 1, indicating a 0.6% annual risk of stroke.   Would avoid class IC with LAFB and inc RBBB on ECG. Failed DCCV on Multaq after 4 shocks.  S/p dofetilide admission 10/7-10/10/24. Patient appears to be maintaining SR Continue dofetilide 500 mcg BID, QT stable Check bmet/mag today Continue Xarelto 20 mg daily Continue Toprol 25 mg daily Kardia mobile for home monitoring.   Obesity Body mass index is 63.8 kg/m.  Encouraged lifestyle modification Suspect this is significantly contributing the persistence of his arrhythmias.  OSA  Encouraged nightly CPAP  HTN Stable on current regimen   Follow up in the AF clinic in one month.     Jorja Loa PA-C Afib Clinic [facility] [address] [place], [place] [address] [phone] 07/02/2023 3:57 PM

## 2023-07-03 DIAGNOSIS — Z7901 Long term (current) use of anticoagulants: Secondary | ICD-10-CM | POA: Diagnosis not present

## 2023-07-03 DIAGNOSIS — M109 Gout, unspecified: Secondary | ICD-10-CM | POA: Diagnosis not present

## 2023-07-03 DIAGNOSIS — I10 Essential (primary) hypertension: Secondary | ICD-10-CM | POA: Diagnosis not present

## 2023-07-03 DIAGNOSIS — I4821 Permanent atrial fibrillation: Secondary | ICD-10-CM | POA: Diagnosis not present

## 2023-07-07 ENCOUNTER — Other Ambulatory Visit (HOSPITAL_BASED_OUTPATIENT_CLINIC_OR_DEPARTMENT_OTHER): Payer: Self-pay

## 2023-07-07 MED ORDER — PREDNISONE 10 MG PO TABS
ORAL_TABLET | ORAL | 0 refills | Status: AC
  Filled 2023-07-07: qty 11, 10d supply, fill #0

## 2023-07-15 DIAGNOSIS — G4733 Obstructive sleep apnea (adult) (pediatric): Secondary | ICD-10-CM | POA: Diagnosis not present

## 2023-07-15 DIAGNOSIS — R0902 Hypoxemia: Secondary | ICD-10-CM | POA: Diagnosis not present

## 2023-07-16 ENCOUNTER — Other Ambulatory Visit (HOSPITAL_BASED_OUTPATIENT_CLINIC_OR_DEPARTMENT_OTHER): Payer: Self-pay

## 2023-07-16 MED ORDER — MONTELUKAST SODIUM 10 MG PO TABS
10.0000 mg | ORAL_TABLET | Freq: Every evening | ORAL | 0 refills | Status: DC
  Filled 2023-07-16: qty 90, 90d supply, fill #0

## 2023-07-25 ENCOUNTER — Other Ambulatory Visit (HOSPITAL_BASED_OUTPATIENT_CLINIC_OR_DEPARTMENT_OTHER): Payer: Self-pay

## 2023-07-27 DIAGNOSIS — G4733 Obstructive sleep apnea (adult) (pediatric): Secondary | ICD-10-CM | POA: Diagnosis not present

## 2023-07-28 ENCOUNTER — Other Ambulatory Visit (HOSPITAL_BASED_OUTPATIENT_CLINIC_OR_DEPARTMENT_OTHER): Payer: Self-pay

## 2023-07-30 ENCOUNTER — Encounter (HOSPITAL_COMMUNITY): Payer: Self-pay | Admitting: Physician Assistant

## 2023-07-30 ENCOUNTER — Other Ambulatory Visit (HOSPITAL_COMMUNITY): Payer: Self-pay | Admitting: *Deleted

## 2023-07-30 ENCOUNTER — Ambulatory Visit (HOSPITAL_COMMUNITY)
Admission: RE | Admit: 2023-07-30 | Discharge: 2023-07-30 | Disposition: A | Discharge status: Home / Self Care | Payer: Commercial Managed Care - PPO | Source: Ambulatory Visit | Attending: Physician Assistant | Admitting: Physician Assistant

## 2023-07-30 ENCOUNTER — Other Ambulatory Visit (HOSPITAL_BASED_OUTPATIENT_CLINIC_OR_DEPARTMENT_OTHER): Payer: Self-pay

## 2023-07-30 VITALS — BP 144/80 | HR 72 | Ht 72.0 in | Wt >= 6400 oz

## 2023-07-30 DIAGNOSIS — Z5181 Encounter for therapeutic drug level monitoring: Secondary | ICD-10-CM | POA: Insufficient documentation

## 2023-07-30 DIAGNOSIS — Z7901 Long term (current) use of anticoagulants: Secondary | ICD-10-CM | POA: Diagnosis not present

## 2023-07-30 DIAGNOSIS — I4819 Other persistent atrial fibrillation: Secondary | ICD-10-CM | POA: Diagnosis not present

## 2023-07-30 DIAGNOSIS — Z79899 Other long term (current) drug therapy: Secondary | ICD-10-CM | POA: Diagnosis not present

## 2023-07-30 DIAGNOSIS — E669 Obesity, unspecified: Secondary | ICD-10-CM | POA: Insufficient documentation

## 2023-07-30 DIAGNOSIS — I4892 Unspecified atrial flutter: Secondary | ICD-10-CM | POA: Insufficient documentation

## 2023-07-30 DIAGNOSIS — G4733 Obstructive sleep apnea (adult) (pediatric): Secondary | ICD-10-CM | POA: Insufficient documentation

## 2023-07-30 DIAGNOSIS — E119 Type 2 diabetes mellitus without complications: Secondary | ICD-10-CM | POA: Insufficient documentation

## 2023-07-30 DIAGNOSIS — I10 Essential (primary) hypertension: Secondary | ICD-10-CM | POA: Insufficient documentation

## 2023-07-30 DIAGNOSIS — Z6841 Body Mass Index (BMI) 40.0 and over, adult: Secondary | ICD-10-CM | POA: Diagnosis not present

## 2023-07-30 LAB — BASIC METABOLIC PANEL
Anion gap: 8 (ref 5–15)
BUN: 6 mg/dL (ref 6–20)
CO2: 24 mmol/L (ref 22–32)
Calcium: 10.2 mg/dL (ref 8.9–10.3)
Chloride: 107 mmol/L (ref 98–111)
Creatinine, Ser: 0.8 mg/dL (ref 0.61–1.24)
GFR, Estimated: >60 mL/min (ref >60)
Glucose, Bld: 114 mg/dL — ABNORMAL HIGH (ref 70–99)
Potassium: 3.3 mmol/L — ABNORMAL LOW (ref 3.5–5.1)
Sodium: 139 mmol/L (ref 135–145)

## 2023-07-30 LAB — MAGNESIUM: Magnesium: 2 mg/dL (ref 1.7–2.4)

## 2023-07-30 MED ORDER — POTASSIUM CHLORIDE CRYS ER 20 MEQ PO TBCR
20.0000 meq | EXTENDED_RELEASE_TABLET | Freq: Every day | ORAL | 1 refills | Status: DC
  Filled 2023-07-30: qty 90, 90d supply, fill #0
  Filled 2023-10-23: qty 90, 90d supply, fill #1

## 2023-07-30 NOTE — Progress Notes (Signed)
Primary Care Physician: Default, Provider, MD Primary Cardiologist: Dr Rennis Golden  Primary Electrophysiologist: Dr Jimmey Ralph  Referring Physician: Edd Fabian NP   Charles Barnett is a 57 y.o. male with a history of morbid obesity, OSA, atrial flutter, HTN, chronic back pain s/p spinal stimulator who presents for follow up in the [facility].  The patient was initially diagnosed with atrial flutter in 2018 after presenting to urgent care with symptoms of SOB and fatigue. He was started on Xarelto for a CHADS2VASC score of 1 and underwent DCCV on 01/30/17. He was found to be back in atrial flutter on follow up 06/04/21 and had another DCCV on 06/27/21. Unfortunately, he had quick return of his atrial flutter. He was started on Multaq and had repeat DCCV on 07/25/21. Patient is s/p DCCV on 05/09/22.   Patient is s/p DCCV x 4 on 05/28/23 which was unsuccessful. Multaq discontinued. Patient is s/p dofetilide admission 06/2023.  On follow up today, patient reports that he has done well since his last visit. He has not had any interim symptoms of afib. He has been dealing with several gout flares lately, currently being treated by his PCP.  Today, he denies symptoms of palpitations, SOB, chest pain, orthopnea, PND, lower extremity edema, dizziness, presyncope, syncope, bleeding, or neurologic sequela. The patient is tolerating medications without difficulties and is otherwise without complaint today.    Atrial Fibrillation Risk Factors:  he does have symptoms or diagnosis of sleep apnea. he is compliant with CPAP therapy. he does not have a history of rheumatic fever.   Atrial Fibrillation Management history:  Previous antiarrhythmic drugs: Multaq, dofetilide  Previous cardioversions: 01/30/17, 06/27/21, 07/25/21, 05/09/22, 05/28/23 Previous ablations: none Anticoagulation history: Xarelto    Past Medical History:  Diagnosis Date   Arthritis    Atrial flutter (HCC)  12/2016   Hypertension    Neuromuscular disorder (HCC)    Sleep apnea     Current Outpatient Medications  Medication Sig Dispense Refill   albuterol (VENTOLIN HFA) 108 (90 Base) MCG/ACT inhaler Inhale 1 puff into the lungs every 4 (four) hours as needed. 6.7 g 0   allopurinol (ZYLOPRIM) 100 MG tablet Take 1 tablet (100 mg total) by mouth daily for 14 days, THEN 2 tablets (200 mg total) daily for 14 days, THEN 3 tablets (300 mg total) daily for 14 days. 84 tablet 0   amLODipine (NORVASC) 5 MG tablet Take 1 & 1/2 tablets (7.5 mg total) by mouth daily. 45 tablet 3   Ascorbic Acid (VITAMIN C PO) Take 1 tablet by mouth daily as needed (immune support/onset cold symptoms).     baclofen (LIORESAL) 10 MG tablet Take 10 mg by mouth 3 (three) times daily as needed for muscle spasms.     dofetilide (TIKOSYN) 500 MCG capsule Take 1 capsule (500 mcg total) by mouth 2 (two) times daily. 180 capsule 1   ergocalciferol (VITAMIN D2) 1.25 MG (50000 UT) capsule Take 2 capsules (100,000 Units total) by mouth 2 (two) times a week. 24 capsule 3   furosemide (LASIX) 20 MG tablet Take 1 tablet (20 mg total) by mouth daily as needed (swelling/wt gain). 10 tablet 0   ipratropium-albuterol (DUONEB) 0.5-2.5 (3) MG/3ML SOLN Take 3 mLs (1 vial) by nebulization every 6 (six) hours as needed. 90 mL 0   metoprolol succinate (TOPROL XL) 25 MG 24 hr tablet Take 1 tablet (25 mg total) by mouth daily. 30 tablet 11   montelukast (SINGULAIR) 10 MG  tablet Take 1 tablet (10 mg total) by mouth every evening. 90 tablet 0   naproxen sodium (ALEVE) 220 MG tablet Take 440 mg by mouth See admin instructions. Four times a week     rivaroxaban (XARELTO) 20 MG TABS tablet Take 1 tablet (20 mg total) by mouth daily with food. 90 tablet 3   No current facility-administered medications for this encounter.    ROS- All systems are reviewed and negative except as per the HPI above.  Physical Exam: Vitals:   07/30/23 1111  BP: (!) 144/80   Pulse: 72  Weight: (!) 217.9 kg  Height: 6' (1.829 m)     GEN: Well nourished, well developed in no acute distress NECK: No JVD; No carotid bruits CARDIAC: Regular rate and rhythm, no murmurs, rubs, gallops RESPIRATORY:  Clear to auscultation without rales, wheezing or rhonchi  ABDOMEN: Soft, non-tender, non-distended EXTREMITIES:  No edema; No deformity    Wt Readings from Last 3 Encounters:  07/30/23 (!) 217.9 kg  07/02/23 (!) 213.4 kg  06/23/23 (!) 214.4 kg    EKG today demonstrates  SR, 1st degree AV block Vent. rate 72 BPM PR interval 242 ms QRS duration 106 ms QT/QTcB 438/479 ms   Echo 01/08/17 demonstrated  - Left ventricle: The cavity size was normal. Wall thickness was    increased in a pattern of mild LVH. Systolic function was normal.    The estimated ejection fraction was in the range of 55% to 60%.    Indeterminant diastolic function (atrial fibrillation). Although    no diagnostic regional wall motion abnormality was identified,    this possibility cannot be completely excluded on the basis of    this study.  - Ventricular septum: Mildly D-shaped interventricular septum,    suggestive of RV pressure/volume overload.  - Aortic valve: There was no stenosis.  - Mitral valve: There was no significant regurgitation.  - Right ventricle: The cavity size was mildly to moderately    dilated. Systolic function was normal.  - Right atrium: The atrium was mildly dilated.  - Atrial septum: Atrial septal aneurysm noted.  - Pulmonary arteries: No complete TR doppler jet so unable to    estimate PA systolic pressure.  - Systemic veins: IVC was not visualized.   Impressions:   - Technically difficult study with poor acoustic windows. Normal LV    size with mild LV hypertrophy. EF 55-60%. Mildly D-shaped    interventricular septum is suggestive of a degree of RV    pressure/volume overload. Mild to moderate RV dilation with    normal systolic function. Unable to  estimate PA systolic pressure    on this study.   Epic records are reviewed at length today  CHA2DS2-VASc Score = 1  The patient's score is based upon: CHF History: 0 HTN History: 1 Diabetes History: 0 Stroke History: 0 Vascular Disease History: 0 Age Score: 0 Gender Score: 0        ASSESSMENT AND PLAN: Persistent atrial fibrillation/Atrial flutter The patient's CHA2DS2-VASc score is 1, indicating a 0.6% annual risk of stroke.   Would avoid class IC with LAFB and inc RBBB on ECG. Failed DCCV on Multaq after 4 shocks.  S/p dofetilide admission 10/7-10/10/24 Patient appears to be maintaining SR Continue dofetilide 500 mcg BID, QT stable Check bmet/mag today Continue Xarelto 20 mg daily Continue Toprol 25 mg daily Kardia mobile for home monitoring.   Obesity Body mass index is 65.15 kg/m.  Encouraged lifestyle modification  OSA  Encouraged nightly CPAP  HTN Stable on current regimen   Follow up in the AF clinic in 3 months.     Jorja Loa PA-C Afib Clinic [facility] [address] [place], [place] [address] [phone] 07/30/2023 11:25 AM

## 2023-08-05 ENCOUNTER — Ambulatory Visit: Payer: Commercial Managed Care - PPO | Admitting: Student

## 2023-08-06 ENCOUNTER — Other Ambulatory Visit (HOSPITAL_COMMUNITY): Payer: Self-pay

## 2023-08-08 ENCOUNTER — Other Ambulatory Visit: Payer: Self-pay | Admitting: Internal Medicine

## 2023-08-08 ENCOUNTER — Other Ambulatory Visit (HOSPITAL_BASED_OUTPATIENT_CLINIC_OR_DEPARTMENT_OTHER): Payer: Self-pay

## 2023-08-08 MED ORDER — AMLODIPINE BESYLATE 5 MG PO TABS
7.5000 mg | ORAL_TABLET | Freq: Every day | ORAL | 0 refills | Status: DC
  Filled 2023-08-08 – 2023-09-02 (×3): qty 45, 30d supply, fill #0

## 2023-08-08 MED ORDER — ALLOPURINOL 100 MG PO TABS
ORAL_TABLET | ORAL | 0 refills | Status: DC
  Filled 2023-08-08 – 2023-08-11 (×2): qty 84, 42d supply, fill #0
  Filled 2023-08-12: qty 84, 28d supply, fill #0

## 2023-08-08 MED ORDER — MONTELUKAST SODIUM 10 MG PO TABS
10.0000 mg | ORAL_TABLET | Freq: Every evening | ORAL | 0 refills | Status: DC
  Filled 2023-08-08 – 2023-10-14 (×2): qty 90, 90d supply, fill #0

## 2023-08-11 ENCOUNTER — Other Ambulatory Visit: Payer: Self-pay

## 2023-08-12 ENCOUNTER — Other Ambulatory Visit (HOSPITAL_BASED_OUTPATIENT_CLINIC_OR_DEPARTMENT_OTHER): Payer: Self-pay

## 2023-08-12 ENCOUNTER — Other Ambulatory Visit: Payer: Self-pay

## 2023-08-12 ENCOUNTER — Ambulatory Visit (HOSPITAL_COMMUNITY)
Admission: RE | Admit: 2023-08-12 | Discharge: 2023-08-12 | Disposition: A | Discharge status: Home / Self Care | Payer: Commercial Managed Care - PPO | Source: Ambulatory Visit | Attending: Physician Assistant | Admitting: Physician Assistant

## 2023-08-12 DIAGNOSIS — Z5181 Encounter for therapeutic drug level monitoring: Secondary | ICD-10-CM | POA: Insufficient documentation

## 2023-08-12 DIAGNOSIS — Z79899 Other long term (current) drug therapy: Secondary | ICD-10-CM

## 2023-08-12 DIAGNOSIS — I4819 Other persistent atrial fibrillation: Secondary | ICD-10-CM | POA: Diagnosis not present

## 2023-08-12 LAB — BASIC METABOLIC PANEL
Anion gap: 8 (ref 5–15)
BUN: 8 mg/dL (ref 6–20)
CO2: 26 mmol/L (ref 22–32)
Calcium: 10.3 mg/dL (ref 8.9–10.3)
Chloride: 106 mmol/L (ref 98–111)
Creatinine, Ser: 0.86 mg/dL (ref 0.61–1.24)
GFR, Estimated: >60 mL/min (ref >60)
Glucose, Bld: 113 mg/dL — ABNORMAL HIGH (ref 70–99)
Potassium: 3.9 mmol/L (ref 3.5–5.1)
Sodium: 140 mmol/L (ref 135–145)

## 2023-08-13 ENCOUNTER — Other Ambulatory Visit (HOSPITAL_BASED_OUTPATIENT_CLINIC_OR_DEPARTMENT_OTHER): Payer: Self-pay

## 2023-08-15 DIAGNOSIS — G4733 Obstructive sleep apnea (adult) (pediatric): Secondary | ICD-10-CM | POA: Diagnosis not present

## 2023-08-15 DIAGNOSIS — R0902 Hypoxemia: Secondary | ICD-10-CM | POA: Diagnosis not present

## 2023-08-26 DIAGNOSIS — G4733 Obstructive sleep apnea (adult) (pediatric): Secondary | ICD-10-CM | POA: Diagnosis not present

## 2023-08-27 ENCOUNTER — Other Ambulatory Visit (HOSPITAL_COMMUNITY): Payer: Self-pay

## 2023-09-01 ENCOUNTER — Other Ambulatory Visit (HOSPITAL_BASED_OUTPATIENT_CLINIC_OR_DEPARTMENT_OTHER): Payer: Self-pay

## 2023-09-02 ENCOUNTER — Other Ambulatory Visit (HOSPITAL_BASED_OUTPATIENT_CLINIC_OR_DEPARTMENT_OTHER): Payer: Self-pay

## 2023-09-02 ENCOUNTER — Other Ambulatory Visit: Payer: Self-pay

## 2023-09-04 ENCOUNTER — Other Ambulatory Visit (HOSPITAL_COMMUNITY): Payer: Self-pay

## 2023-09-05 ENCOUNTER — Other Ambulatory Visit (HOSPITAL_BASED_OUTPATIENT_CLINIC_OR_DEPARTMENT_OTHER): Payer: Self-pay

## 2023-09-05 ENCOUNTER — Other Ambulatory Visit (HOSPITAL_COMMUNITY): Payer: Self-pay

## 2023-09-05 MED ORDER — ALLOPURINOL 100 MG PO TABS
ORAL_TABLET | ORAL | 0 refills | Status: DC
  Filled 2023-09-05: qty 84, 32d supply, fill #0
  Filled 2023-09-05: qty 84, 28d supply, fill #0

## 2023-09-12 ENCOUNTER — Telehealth (HOSPITAL_COMMUNITY): Payer: Self-pay | Admitting: *Deleted

## 2023-09-12 ENCOUNTER — Encounter: Payer: Self-pay | Admitting: Internal Medicine

## 2023-09-12 NOTE — Telephone Encounter (Signed)
Patient called in stating he went back into afib this afternoon this heart rates are in the 120s. Intermittent chest pain which is not abnormal for him while in afib. Discussed with Landry Mellow PA will increase metoprolol to 25mg  BID while in afib. If still in afib next week pt will call office to be seen. ER precautions reviewed with patient. Pt verbalized understanding.

## 2023-09-12 NOTE — Telephone Encounter (Signed)
Error

## 2023-09-14 DIAGNOSIS — R0902 Hypoxemia: Secondary | ICD-10-CM | POA: Diagnosis not present

## 2023-09-14 DIAGNOSIS — G4733 Obstructive sleep apnea (adult) (pediatric): Secondary | ICD-10-CM | POA: Diagnosis not present

## 2023-09-19 ENCOUNTER — Other Ambulatory Visit (HOSPITAL_BASED_OUTPATIENT_CLINIC_OR_DEPARTMENT_OTHER): Payer: Self-pay

## 2023-09-26 DIAGNOSIS — G4733 Obstructive sleep apnea (adult) (pediatric): Secondary | ICD-10-CM | POA: Diagnosis not present

## 2023-10-01 ENCOUNTER — Other Ambulatory Visit (HOSPITAL_BASED_OUTPATIENT_CLINIC_OR_DEPARTMENT_OTHER): Payer: Self-pay

## 2023-10-01 ENCOUNTER — Other Ambulatory Visit: Payer: Self-pay | Admitting: Internal Medicine

## 2023-10-01 ENCOUNTER — Other Ambulatory Visit: Payer: Self-pay

## 2023-10-01 MED ORDER — AMLODIPINE BESYLATE 5 MG PO TABS
7.5000 mg | ORAL_TABLET | Freq: Every day | ORAL | 1 refills | Status: DC
  Filled 2023-10-01: qty 135, 90d supply, fill #0
  Filled 2024-01-01: qty 135, 90d supply, fill #1

## 2023-10-03 ENCOUNTER — Other Ambulatory Visit (HOSPITAL_BASED_OUTPATIENT_CLINIC_OR_DEPARTMENT_OTHER): Payer: Self-pay

## 2023-10-06 ENCOUNTER — Other Ambulatory Visit (HOSPITAL_BASED_OUTPATIENT_CLINIC_OR_DEPARTMENT_OTHER): Payer: Self-pay

## 2023-10-06 MED ORDER — ALLOPURINOL 100 MG PO TABS
ORAL_TABLET | ORAL | 0 refills | Status: DC
  Filled 2023-10-06: qty 84, 42d supply, fill #0

## 2023-10-14 ENCOUNTER — Other Ambulatory Visit (HOSPITAL_BASED_OUTPATIENT_CLINIC_OR_DEPARTMENT_OTHER): Payer: Self-pay

## 2023-10-14 ENCOUNTER — Other Ambulatory Visit: Payer: Self-pay

## 2023-10-15 DIAGNOSIS — R0902 Hypoxemia: Secondary | ICD-10-CM | POA: Diagnosis not present

## 2023-10-15 DIAGNOSIS — G4733 Obstructive sleep apnea (adult) (pediatric): Secondary | ICD-10-CM | POA: Diagnosis not present

## 2023-10-29 ENCOUNTER — Encounter (HOSPITAL_COMMUNITY): Payer: Self-pay | Admitting: Physician Assistant

## 2023-10-29 ENCOUNTER — Ambulatory Visit (HOSPITAL_COMMUNITY)
Admission: RE | Admit: 2023-10-29 | Discharge: 2023-10-29 | Disposition: A | Discharge status: Home / Self Care | Payer: Commercial Managed Care - PPO | Source: Ambulatory Visit | Attending: Physician Assistant | Admitting: Physician Assistant

## 2023-10-29 VITALS — BP 126/90 | HR 70 | Ht 72.0 in | Wt >= 6400 oz

## 2023-10-29 DIAGNOSIS — I4892 Unspecified atrial flutter: Secondary | ICD-10-CM | POA: Insufficient documentation

## 2023-10-29 DIAGNOSIS — G4733 Obstructive sleep apnea (adult) (pediatric): Secondary | ICD-10-CM | POA: Insufficient documentation

## 2023-10-29 DIAGNOSIS — Z5181 Encounter for therapeutic drug level monitoring: Secondary | ICD-10-CM | POA: Diagnosis not present

## 2023-10-29 DIAGNOSIS — Z79899 Other long term (current) drug therapy: Secondary | ICD-10-CM | POA: Insufficient documentation

## 2023-10-29 DIAGNOSIS — I119 Hypertensive heart disease without heart failure: Secondary | ICD-10-CM | POA: Diagnosis not present

## 2023-10-29 DIAGNOSIS — Z7901 Long term (current) use of anticoagulants: Secondary | ICD-10-CM | POA: Diagnosis not present

## 2023-10-29 DIAGNOSIS — I4819 Other persistent atrial fibrillation: Secondary | ICD-10-CM | POA: Diagnosis not present

## 2023-10-29 DIAGNOSIS — Z6841 Body Mass Index (BMI) 40.0 and over, adult: Secondary | ICD-10-CM | POA: Diagnosis not present

## 2023-10-29 LAB — BASIC METABOLIC PANEL
Anion gap: 14 (ref 5–15)
BUN: 8 mg/dL (ref 6–20)
CO2: 23 mmol/L (ref 22–32)
Calcium: 10.5 mg/dL — ABNORMAL HIGH (ref 8.9–10.3)
Chloride: 102 mmol/L (ref 98–111)
Creatinine, Ser: 0.79 mg/dL (ref 0.61–1.24)
GFR, Estimated: >60 mL/min (ref >60)
Glucose, Bld: 98 mg/dL (ref 70–99)
Potassium: 4 mmol/L (ref 3.5–5.1)
Sodium: 139 mmol/L (ref 135–145)

## 2023-10-29 LAB — MAGNESIUM: Magnesium: 2 mg/dL (ref 1.7–2.4)

## 2023-10-29 NOTE — Progress Notes (Signed)
Primary Care Physician: Default, Provider, MD Primary Cardiologist: Dr Rennis Golden  Primary Electrophysiologist: Dr Jimmey Ralph  Referring Physician: Edd Fabian NP   Charles Barnett is a 58 y.o. male with a history of morbid obesity, OSA, atrial flutter, HTN, chronic back pain s/p spinal stimulator who presents for follow up in the [facility].  The patient was initially diagnosed with atrial flutter in 2018 after presenting to urgent care with symptoms of SOB and fatigue. He was started on Xarelto for a CHADS2VASC score of 1 and underwent DCCV on 01/30/17. He was found to be back in atrial flutter on follow up 06/04/21 and had another DCCV on 06/27/21. Unfortunately, he had quick return of his atrial flutter. He was started on Multaq and had repeat DCCV on 07/25/21. Patient is s/p DCCV on 05/09/22.   Patient is s/p DCCV x 4 on 05/28/23 which was unsuccessful. Multaq discontinued. Patient is s/p dofetilide admission 06/2023.  Patient returns for follow up for atrial fibrillation and dofetilide monitoring. He did have one episode of afib 09/12/23 which resolved with an extra dose of metoprolol. No bleeding issues on anticoagulation.   Today, he denies symptoms of palpitations, chest pain, shortness of breath, orthopnea, PND, lower extremity edema, dizziness, presyncope, syncope, bleeding, or neurologic sequela. The patient is tolerating medications without difficulties and is otherwise without complaint today.    Atrial Fibrillation Risk Factors:  he does have symptoms or diagnosis of sleep apnea. he does not have a history of rheumatic fever.   Atrial Fibrillation Management history:  Previous antiarrhythmic drugs: Multaq, dofetilide  Previous cardioversions: 01/30/17, 06/27/21, 07/25/21, 05/09/22, 05/28/23 Previous ablations: none Anticoagulation history: Xarelto    Past Medical History:  Diagnosis Date   Arthritis    Atrial flutter (HCC) 12/2016   Hypertension     Neuromuscular disorder (HCC)    Sleep apnea     Current Outpatient Medications  Medication Sig Dispense Refill   albuterol (VENTOLIN HFA) 108 (90 Base) MCG/ACT inhaler Inhale 1 puff into the lungs every 4 (four) hours as needed. 6.7 g 0   allopurinol (ZYLOPRIM) 100 MG tablet Take 1 tablet (100 mg total) by mouth daily for 14 days, THEN 2 tablets (200 mg total) daily for 14 days, THEN 3 tablets (300 mg total) daily for 14 days. 84 tablet 0   amLODipine (NORVASC) 5 MG tablet Take 1.5 tablets (7.5 mg total) by mouth daily. 135 tablet 1   Ascorbic Acid (VITAMIN C PO) Take 1 tablet by mouth daily as needed (immune support/onset cold symptoms).     baclofen (LIORESAL) 10 MG tablet Take 10 mg by mouth 3 (three) times daily as needed for muscle spasms.     dofetilide (TIKOSYN) 500 MCG capsule Take 1 capsule (500 mcg total) by mouth 2 (two) times daily. 180 capsule 1   ergocalciferol (VITAMIN D2) 1.25 MG (50000 UT) capsule Take 2 capsules (100,000 Units total) by mouth 2 (two) times a week. 24 capsule 3   furosemide (LASIX) 20 MG tablet Take 1 tablet (20 mg total) by mouth daily as needed (swelling/wt gain). 10 tablet 0   ipratropium-albuterol (DUONEB) 0.5-2.5 (3) MG/3ML SOLN Take 3 mLs (1 vial) by nebulization every 6 (six) hours as needed. 90 mL 0   metoprolol succinate (TOPROL XL) 25 MG 24 hr tablet Take 1 tablet (25 mg total) by mouth daily. 30 tablet 11   montelukast (SINGULAIR) 10 MG tablet Take 1 tablet (10 mg total) by mouth every evening. 90  tablet 0   naproxen sodium (ALEVE) 220 MG tablet Take 440 mg by mouth See admin instructions. Four times a week     potassium chloride SA (KLOR-CON M) 20 MEQ tablet Take 1 tablet (20 mEq total) by mouth daily. 90 tablet 1   rivaroxaban (XARELTO) 20 MG TABS tablet Take 1 tablet (20 mg total) by mouth daily with food. 90 tablet 3   No current facility-administered medications for this encounter.    ROS- All systems are reviewed and negative except as per  the HPI above.  Physical Exam: Vitals:   10/29/23 1058  BP: (!) 126/90  Pulse: 70  Weight: (!) 214.6 kg  Height: 6' (1.829 m)    GEN: Well nourished, well developed in no acute distress CARDIAC: Regular rate and rhythm, no murmurs, rubs, gallops RESPIRATORY:  Clear to auscultation without rales, wheezing or rhonchi  ABDOMEN: Soft, non-tender, non-distended EXTREMITIES:  No edema; No deformity    Wt Readings from Last 3 Encounters:  10/29/23 (!) 214.6 kg  07/30/23 (!) 217.9 kg  07/02/23 (!) 213.4 kg    EKG today demonstrates  SR, 1st degree AV block, LAFB Vent. rate 70 BPM PR interval 226 ms QRS duration 108 ms QT/QTcB 416/449 ms   Echo 01/08/17 demonstrated  - Left ventricle: The cavity size was normal. Wall thickness was    increased in a pattern of mild LVH. Systolic function was normal.    The estimated ejection fraction was in the range of 55% to 60%.    Indeterminant diastolic function (atrial fibrillation). Although    no diagnostic regional wall motion abnormality was identified,    this possibility cannot be completely excluded on the basis of    this study.  - Ventricular septum: Mildly D-shaped interventricular septum,    suggestive of RV pressure/volume overload.  - Aortic valve: There was no stenosis.  - Mitral valve: There was no significant regurgitation.  - Right ventricle: The cavity size was mildly to moderately    dilated. Systolic function was normal.  - Right atrium: The atrium was mildly dilated.  - Atrial septum: Atrial septal aneurysm noted.  - Pulmonary arteries: No complete TR doppler jet so unable to    estimate PA systolic pressure.  - Systemic veins: IVC was not visualized.   Impressions:   - Technically difficult study with poor acoustic windows. Normal LV    size with mild LV hypertrophy. EF 55-60%. Mildly D-shaped    interventricular septum is suggestive of a degree of RV    pressure/volume overload. Mild to moderate RV dilation  with    normal systolic function. Unable to estimate PA systolic pressure    on this study.   Epic records are reviewed at length today  CHA2DS2-VASc Score = 1  The patient's score is based upon: CHF History: 0 HTN History: 1 Diabetes History: 0 Stroke History: 0 Vascular Disease History: 0 Age Score: 0 Gender Score: 0       ASSESSMENT AND PLAN: Persistent Atrial Fibrillation/atrial flutter The patient's CHA2DS2-VASc score is 1, indicating a 0.6% annual risk of stroke.   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 Previously failed Multaq Loaded on dofetilide 06/2023 Patient appears to be maintaining SR Continue dofetilide 500 mcg BID Continue Xarelto 20 mg daily Continue Toprol 25 mg daily Kardia mobile for home monitoring   High Risk Medication Monitoring (ICD 10: Z79.899) QT interval on ECG appropriate for dofetilide monitoring Check bmet/mag today  Obesity Body mass index  is 64.15 kg/m.  Encouraged lifestyle modification  OSA  Encouraged nightly CPAP  HTN Stable on current regimen   Follow up in the AF clinic in 4 months.     Jorja Loa PA-C Afib Clinic [facility] [address] [place], [place] [address] [phone] 10/29/2023 11:05 AM

## 2023-10-31 ENCOUNTER — Other Ambulatory Visit (HOSPITAL_BASED_OUTPATIENT_CLINIC_OR_DEPARTMENT_OTHER): Payer: Self-pay

## 2023-11-14 ENCOUNTER — Other Ambulatory Visit (HOSPITAL_BASED_OUTPATIENT_CLINIC_OR_DEPARTMENT_OTHER): Payer: Self-pay

## 2023-11-14 DIAGNOSIS — G4733 Obstructive sleep apnea (adult) (pediatric): Secondary | ICD-10-CM | POA: Diagnosis not present

## 2023-11-14 DIAGNOSIS — R0902 Hypoxemia: Secondary | ICD-10-CM | POA: Diagnosis not present

## 2023-11-14 MED ORDER — ALLOPURINOL 100 MG PO TABS
ORAL_TABLET | ORAL | 0 refills | Status: DC
  Filled 2023-11-14: qty 84, 42d supply, fill #0

## 2023-12-12 DIAGNOSIS — R0902 Hypoxemia: Secondary | ICD-10-CM | POA: Diagnosis not present

## 2023-12-12 DIAGNOSIS — G4733 Obstructive sleep apnea (adult) (pediatric): Secondary | ICD-10-CM | POA: Diagnosis not present

## 2023-12-26 ENCOUNTER — Other Ambulatory Visit (HOSPITAL_BASED_OUTPATIENT_CLINIC_OR_DEPARTMENT_OTHER): Payer: Self-pay

## 2023-12-27 ENCOUNTER — Other Ambulatory Visit (HOSPITAL_BASED_OUTPATIENT_CLINIC_OR_DEPARTMENT_OTHER): Payer: Self-pay

## 2023-12-27 MED ORDER — ALLOPURINOL 100 MG PO TABS
ORAL_TABLET | ORAL | 0 refills | Status: DC
  Filled 2023-12-27: qty 84, 42d supply, fill #0

## 2023-12-29 ENCOUNTER — Other Ambulatory Visit (HOSPITAL_BASED_OUTPATIENT_CLINIC_OR_DEPARTMENT_OTHER): Payer: Self-pay

## 2024-01-05 ENCOUNTER — Other Ambulatory Visit (HOSPITAL_BASED_OUTPATIENT_CLINIC_OR_DEPARTMENT_OTHER): Payer: Self-pay

## 2024-01-05 DIAGNOSIS — I4891 Unspecified atrial fibrillation: Secondary | ICD-10-CM | POA: Diagnosis not present

## 2024-01-05 DIAGNOSIS — G894 Chronic pain syndrome: Secondary | ICD-10-CM | POA: Diagnosis not present

## 2024-01-05 DIAGNOSIS — I10 Essential (primary) hypertension: Secondary | ICD-10-CM | POA: Diagnosis not present

## 2024-01-05 DIAGNOSIS — Z7901 Long term (current) use of anticoagulants: Secondary | ICD-10-CM | POA: Diagnosis not present

## 2024-01-05 DIAGNOSIS — R5383 Other fatigue: Secondary | ICD-10-CM | POA: Diagnosis not present

## 2024-01-05 DIAGNOSIS — E559 Vitamin D deficiency, unspecified: Secondary | ICD-10-CM | POA: Diagnosis not present

## 2024-01-05 DIAGNOSIS — J449 Chronic obstructive pulmonary disease, unspecified: Secondary | ICD-10-CM | POA: Diagnosis not present

## 2024-01-05 DIAGNOSIS — M109 Gout, unspecified: Secondary | ICD-10-CM | POA: Diagnosis not present

## 2024-01-05 DIAGNOSIS — R7301 Impaired fasting glucose: Secondary | ICD-10-CM | POA: Diagnosis not present

## 2024-01-09 ENCOUNTER — Other Ambulatory Visit (HOSPITAL_BASED_OUTPATIENT_CLINIC_OR_DEPARTMENT_OTHER): Payer: Self-pay

## 2024-01-12 DIAGNOSIS — R0902 Hypoxemia: Secondary | ICD-10-CM | POA: Diagnosis not present

## 2024-01-12 DIAGNOSIS — G4733 Obstructive sleep apnea (adult) (pediatric): Secondary | ICD-10-CM | POA: Diagnosis not present

## 2024-01-15 ENCOUNTER — Other Ambulatory Visit (HOSPITAL_BASED_OUTPATIENT_CLINIC_OR_DEPARTMENT_OTHER): Payer: Self-pay

## 2024-01-15 ENCOUNTER — Other Ambulatory Visit (HOSPITAL_COMMUNITY): Payer: Self-pay | Admitting: Physician Assistant

## 2024-01-15 ENCOUNTER — Other Ambulatory Visit: Payer: Self-pay

## 2024-01-15 MED ORDER — MONTELUKAST SODIUM 10 MG PO TABS
10.0000 mg | ORAL_TABLET | Freq: Every evening | ORAL | 0 refills | Status: DC
  Filled 2024-01-15: qty 90, 90d supply, fill #0

## 2024-01-15 MED ORDER — DOFETILIDE 500 MCG PO CAPS
500.0000 ug | ORAL_CAPSULE | Freq: Two times a day (BID) | ORAL | 1 refills | Status: DC
  Filled 2024-01-15: qty 180, 90d supply, fill #0
  Filled 2024-04-14: qty 180, 90d supply, fill #1

## 2024-01-15 MED ORDER — POTASSIUM CHLORIDE CRYS ER 20 MEQ PO TBCR
20.0000 meq | EXTENDED_RELEASE_TABLET | Freq: Every day | ORAL | 1 refills | Status: DC
  Filled 2024-01-15: qty 90, 90d supply, fill #0
  Filled 2024-04-14: qty 90, 90d supply, fill #1

## 2024-01-25 DIAGNOSIS — G4733 Obstructive sleep apnea (adult) (pediatric): Secondary | ICD-10-CM | POA: Diagnosis not present

## 2024-02-06 ENCOUNTER — Other Ambulatory Visit (HOSPITAL_BASED_OUTPATIENT_CLINIC_OR_DEPARTMENT_OTHER): Payer: Self-pay

## 2024-02-06 MED ORDER — ALLOPURINOL 100 MG PO TABS
ORAL_TABLET | ORAL | 0 refills | Status: DC
  Filled 2024-02-06: qty 84, 42d supply, fill #0

## 2024-02-11 DIAGNOSIS — R0902 Hypoxemia: Secondary | ICD-10-CM | POA: Diagnosis not present

## 2024-02-11 DIAGNOSIS — G4733 Obstructive sleep apnea (adult) (pediatric): Secondary | ICD-10-CM | POA: Diagnosis not present

## 2024-03-03 ENCOUNTER — Other Ambulatory Visit (HOSPITAL_BASED_OUTPATIENT_CLINIC_OR_DEPARTMENT_OTHER): Payer: Self-pay

## 2024-03-03 ENCOUNTER — Encounter (HOSPITAL_COMMUNITY): Payer: Self-pay | Admitting: Physician Assistant

## 2024-03-03 ENCOUNTER — Other Ambulatory Visit: Payer: Self-pay

## 2024-03-03 ENCOUNTER — Ambulatory Visit (HOSPITAL_COMMUNITY)
Admission: RE | Admit: 2024-03-03 | Discharge: 2024-03-03 | Disposition: A | Discharge status: Home / Self Care | Payer: Commercial Managed Care - PPO | Source: Ambulatory Visit | Attending: Physician Assistant | Admitting: Physician Assistant

## 2024-03-03 VITALS — BP 132/82 | HR 71 | Ht 72.0 in | Wt >= 6400 oz

## 2024-03-03 DIAGNOSIS — Z5181 Encounter for therapeutic drug level monitoring: Secondary | ICD-10-CM | POA: Diagnosis not present

## 2024-03-03 DIAGNOSIS — I4819 Other persistent atrial fibrillation: Secondary | ICD-10-CM | POA: Diagnosis not present

## 2024-03-03 DIAGNOSIS — Z79899 Other long term (current) drug therapy: Secondary | ICD-10-CM | POA: Diagnosis not present

## 2024-03-03 MED ORDER — ALBUTEROL SULFATE HFA 108 (90 BASE) MCG/ACT IN AERS
1.0000 | INHALATION_SPRAY | RESPIRATORY_TRACT | 0 refills | Status: AC | PRN
  Filled 2024-03-03: qty 6.7, 33d supply, fill #0

## 2024-03-03 NOTE — Progress Notes (Signed)
 Primary Care Physician: Default, Provider, MD Primary Cardiologist: Dr Maximo Spar  Primary Electrophysiologist: Dr Daneil Dunker  Referring Physician: Lawana Pray NP   Charles Barnett is a 58 y.o. male with a history of morbid obesity, OSA, atrial flutter, HTN, chronic back pain s/p spinal stimulator who presents for follow up in the [facility].  The patient was initially diagnosed with atrial flutter in 2018 after presenting to urgent care with symptoms of SOB and fatigue. He was started on Xarelto  for a CHADS2VASC score of 1 and underwent DCCV on 01/30/17. He was found to be back in atrial flutter on follow up 06/04/21 and had another DCCV on 06/27/21. Unfortunately, he had quick return of his atrial flutter. He was started on Multaq  and had repeat DCCV on 07/25/21. Patient is s/p DCCV on 05/09/22.   Patient is s/p DCCV x 4 on 05/28/23 which was unsuccessful. Multaq  discontinued. Patient is s/p dofetilide  admission 06/2023.  Patient returns for follow up for atrial fibrillation and dofetilide  monitoring. He reports that he has done well since his last visit. He remains in SR with no interim symptoms of afib. No bleeding issues on anticoagulation.   Today, he  denies symptoms of palpitations, chest pain, shortness of breath, orthopnea, PND, lower extremity edema, dizziness, presyncope, syncope, snoring, daytime somnolence, bleeding, or neurologic sequela. The patient is tolerating medications without difficulties and is otherwise without complaint today.    Atrial Fibrillation Risk Factors:  he does have symptoms or diagnosis of sleep apnea. he does not have a history of rheumatic fever.   Atrial Fibrillation Management history:  Previous antiarrhythmic drugs: Multaq , dofetilide   Previous cardioversions: 01/30/17, 06/27/21, 07/25/21, 05/09/22, 05/28/23 Previous ablations: none Anticoagulation history: Xarelto     Past Medical History:  Diagnosis Date   Arthritis     Atrial flutter (HCC) 12/2016   Hypertension    Neuromuscular disorder (HCC)    Sleep apnea     Current Outpatient Medications  Medication Sig Dispense Refill   albuterol  (VENTOLIN  HFA) 108 (90 Base) MCG/ACT inhaler Inhale 1 puff into the lungs every 4 (four) hours as needed. 6.7 g 0   allopurinol  (ZYLOPRIM ) 100 MG tablet Take 1 tablet (100 mg total) by mouth daily for 14 days, THEN 2 tablets (200 mg total) daily for 14 days, THEN 3 tablets (300 mg total) daily for 14 days. 84 tablet 0   amLODipine  (NORVASC ) 5 MG tablet Take 1.5 tablets (7.5 mg total) by mouth daily. 135 tablet 1   baclofen  (LIORESAL ) 10 MG tablet Take 10 mg by mouth 3 (three) times daily as needed for muscle spasms.     dofetilide  (TIKOSYN ) 500 MCG capsule Take 1 capsule (500 mcg total) by mouth 2 (two) times daily. 180 capsule 1   ergocalciferol  (VITAMIN D2) 1.25 MG (50000 UT) capsule Take 2 capsules (100,000 Units total) by mouth 2 (two) times a week. 24 capsule 3   furosemide  (LASIX ) 20 MG tablet Take 1 tablet (20 mg total) by mouth daily as needed (swelling/wt gain). 10 tablet 0   ipratropium-albuterol  (DUONEB) 0.5-2.5 (3) MG/3ML SOLN Take 3 mLs (1 vial) by nebulization every 6 (six) hours as needed. 90 mL 0   metoprolol  succinate (TOPROL  XL) 25 MG 24 hr tablet Take 1 tablet (25 mg total) by mouth daily. 30 tablet 11   montelukast  (SINGULAIR ) 10 MG tablet Take 1 tablet (10 mg total) by mouth every evening. 90 tablet 0   naproxen sodium (ALEVE) 220 MG tablet Take 440  mg by mouth See admin instructions. Four times a week     potassium chloride  SA (KLOR-CON  M) 20 MEQ tablet Take 1 tablet (20 mEq total) by mouth daily. 90 tablet 1   rivaroxaban  (XARELTO ) 20 MG TABS tablet Take 1 tablet (20 mg total) by mouth daily with food. 90 tablet 3   No current facility-administered medications for this encounter.    ROS- All systems are reviewed and negative except as per the HPI above.  Physical Exam: Vitals:   03/03/24 1117  BP:  132/82  Pulse: 71  Weight: (!) 217.4 kg  Height: 6' (1.829 m)     GEN: Well nourished, well developed in no acute distress CARDIAC: Regular rate and rhythm, no murmurs, rubs, gallops RESPIRATORY:  Clear to auscultation without rales, wheezing or rhonchi  ABDOMEN: Soft, non-tender, non-distended EXTREMITIES:  No edema; No deformity    Wt Readings from Last 3 Encounters:  03/03/24 (!) 217.4 kg  10/29/23 (!) 214.6 kg  07/30/23 (!) 217.9 kg    EKG today demonstrates  SR, 1st degree AV block Vent. rate 71 BPM PR interval 224 ms QRS duration 110 ms QT/QTcB 430/467 ms   Echo 01/08/17 demonstrated  - Left ventricle: The cavity size was normal. Wall thickness was    increased in a pattern of mild LVH. Systolic function was normal.    The estimated ejection fraction was in the range of 55% to 60%.    Indeterminant diastolic function (atrial fibrillation). Although    no diagnostic regional wall motion abnormality was identified,    this possibility cannot be completely excluded on the basis of    this study.  - Ventricular septum: Mildly D-shaped interventricular septum,    suggestive of RV pressure/volume overload.  - Aortic valve: There was no stenosis.  - Mitral valve: There was no significant regurgitation.  - Right ventricle: The cavity size was mildly to moderately    dilated. Systolic function was normal.  - Right atrium: The atrium was mildly dilated.  - Atrial septum: Atrial septal aneurysm noted.  - Pulmonary arteries: No complete TR doppler jet so unable to    estimate PA systolic pressure.  - Systemic veins: IVC was not visualized.   Impressions:   - Technically difficult study with poor acoustic windows. Normal LV    size with mild LV hypertrophy. EF 55-60%. Mildly D-shaped    interventricular septum is suggestive of a degree of RV    pressure/volume overload. Mild to moderate RV dilation with    normal systolic function. Unable to estimate PA systolic pressure     on this study.   Epic records are reviewed at length today  CHA2DS2-VASc Score = 1  The patient's score is based upon: CHF History: 0 HTN History: 1 Diabetes History: 0 Stroke History: 0 Vascular Disease History: 0 Age Score: 0 Gender Score: 0       ASSESSMENT AND PLAN: Persistent Atrial Fibrillation/atrial flutter The patient's CHA2DS2-VASc score is 1, indicating a 0.6% annual risk of stroke.   Patient appears to be maintaining SR Previously failed Multaq  S/p dofetilide  loading 06/2023 Continue dofetilide  500 mcg BID Continue Xarelto  20 mg daily Continue Toprol  25 mg daily Kardia mobile for home monitoring.    High Risk Medication Monitoring (ICD 10: Z79.899) QT interval on ECG acceptable for dofetilide  monitoring. Check bmet/mag today.     Obesity Body mass index is 64.99 kg/m.  Encouraged lifestyle modification  OSA  Encouraged nightly CPAP  HTN Stable on current  regimen   Follow up in the AF clinic in 3-4 months.     Myrtha Ates PA-C Af[facility] [address] [place], [place] [address] [phone] 03/03/2024 11:24 AM

## 2024-03-04 ENCOUNTER — Ambulatory Visit (HOSPITAL_COMMUNITY): Payer: Self-pay | Admitting: Physician Assistant

## 2024-03-04 LAB — BASIC METABOLIC PANEL WITH GFR
BUN/Creatinine Ratio: 7 — ABNORMAL LOW (ref 9–20)
BUN: 6 mg/dL (ref 6–24)
CO2: 24 mmol/L (ref 20–29)
Calcium: 10.6 mg/dL — ABNORMAL HIGH (ref 8.7–10.2)
Chloride: 103 mmol/L (ref 96–106)
Creatinine, Ser: 0.84 mg/dL (ref 0.76–1.27)
Glucose: 94 mg/dL (ref 70–99)
Potassium: 4.5 mmol/L (ref 3.5–5.2)
Sodium: 143 mmol/L (ref 134–144)
eGFR: 100 mL/min/{1.73_m2} (ref >59)

## 2024-03-04 LAB — MAGNESIUM: Magnesium: 1.9 mg/dL (ref 1.6–2.3)

## 2024-03-13 DIAGNOSIS — G4733 Obstructive sleep apnea (adult) (pediatric): Secondary | ICD-10-CM | POA: Diagnosis not present

## 2024-03-13 DIAGNOSIS — R0902 Hypoxemia: Secondary | ICD-10-CM | POA: Diagnosis not present

## 2024-03-19 ENCOUNTER — Other Ambulatory Visit (HOSPITAL_BASED_OUTPATIENT_CLINIC_OR_DEPARTMENT_OTHER): Payer: Self-pay

## 2024-03-19 MED ORDER — ALLOPURINOL 100 MG PO TABS
ORAL_TABLET | ORAL | 0 refills | Status: DC
  Filled 2024-03-19: qty 84, 42d supply, fill #0

## 2024-03-22 ENCOUNTER — Other Ambulatory Visit (HOSPITAL_BASED_OUTPATIENT_CLINIC_OR_DEPARTMENT_OTHER): Payer: Self-pay

## 2024-03-24 ENCOUNTER — Other Ambulatory Visit (HOSPITAL_BASED_OUTPATIENT_CLINIC_OR_DEPARTMENT_OTHER): Payer: Self-pay

## 2024-03-25 ENCOUNTER — Other Ambulatory Visit (HOSPITAL_BASED_OUTPATIENT_CLINIC_OR_DEPARTMENT_OTHER): Payer: Self-pay

## 2024-03-31 ENCOUNTER — Other Ambulatory Visit: Payer: Self-pay | Admitting: Internal Medicine

## 2024-04-01 ENCOUNTER — Other Ambulatory Visit (HOSPITAL_BASED_OUTPATIENT_CLINIC_OR_DEPARTMENT_OTHER): Payer: Self-pay

## 2024-04-01 MED ORDER — AMLODIPINE BESYLATE 5 MG PO TABS
7.5000 mg | ORAL_TABLET | Freq: Every day | ORAL | 0 refills | Status: DC
  Filled 2024-04-01: qty 45, 30d supply, fill #0

## 2024-04-09 ENCOUNTER — Other Ambulatory Visit (HOSPITAL_BASED_OUTPATIENT_CLINIC_OR_DEPARTMENT_OTHER): Payer: Self-pay

## 2024-04-12 DIAGNOSIS — G4733 Obstructive sleep apnea (adult) (pediatric): Secondary | ICD-10-CM | POA: Diagnosis not present

## 2024-04-12 DIAGNOSIS — R0902 Hypoxemia: Secondary | ICD-10-CM | POA: Diagnosis not present

## 2024-04-14 ENCOUNTER — Other Ambulatory Visit (HOSPITAL_BASED_OUTPATIENT_CLINIC_OR_DEPARTMENT_OTHER): Payer: Self-pay

## 2024-04-14 ENCOUNTER — Other Ambulatory Visit: Payer: Self-pay | Admitting: Internal Medicine

## 2024-04-14 ENCOUNTER — Other Ambulatory Visit: Payer: Self-pay

## 2024-04-14 MED ORDER — RIVAROXABAN 20 MG PO TABS
20.0000 mg | ORAL_TABLET | Freq: Every day | ORAL | 3 refills | Status: AC
  Filled 2024-04-14: qty 90, 90d supply, fill #0
  Filled 2024-07-13: qty 90, 90d supply, fill #1
  Filled 2024-10-11: qty 90, 90d supply, fill #2

## 2024-04-14 MED ORDER — MONTELUKAST SODIUM 10 MG PO TABS
10.0000 mg | ORAL_TABLET | Freq: Every evening | ORAL | 0 refills | Status: AC
  Filled 2024-04-14: qty 90, 90d supply, fill #0

## 2024-04-14 NOTE — Telephone Encounter (Signed)
 Prescription refill request for Xarelto  received.  Indication:AFIB Last office visit:6/25 Weight:217.4  kg Age:58 Scr:0.84  6/25 CrCl:291.16  ml/min  Prescription refilled

## 2024-04-16 ENCOUNTER — Other Ambulatory Visit (HOSPITAL_BASED_OUTPATIENT_CLINIC_OR_DEPARTMENT_OTHER): Payer: Self-pay

## 2024-04-24 DIAGNOSIS — G4733 Obstructive sleep apnea (adult) (pediatric): Secondary | ICD-10-CM | POA: Diagnosis not present

## 2024-04-30 ENCOUNTER — Other Ambulatory Visit (HOSPITAL_BASED_OUTPATIENT_CLINIC_OR_DEPARTMENT_OTHER): Payer: Self-pay

## 2024-04-30 MED ORDER — ALLOPURINOL 100 MG PO TABS
ORAL_TABLET | ORAL | 0 refills | Status: DC
  Filled 2024-04-30: qty 84, 42d supply, fill #0

## 2024-05-03 ENCOUNTER — Other Ambulatory Visit: Payer: Self-pay | Admitting: Internal Medicine

## 2024-05-05 ENCOUNTER — Other Ambulatory Visit (HOSPITAL_BASED_OUTPATIENT_CLINIC_OR_DEPARTMENT_OTHER): Payer: Self-pay

## 2024-05-05 MED ORDER — AMLODIPINE BESYLATE 5 MG PO TABS
7.5000 mg | ORAL_TABLET | Freq: Every day | ORAL | 0 refills | Status: DC
  Filled 2024-05-05: qty 30, 20d supply, fill #0

## 2024-05-13 DIAGNOSIS — G4733 Obstructive sleep apnea (adult) (pediatric): Secondary | ICD-10-CM | POA: Diagnosis not present

## 2024-05-13 DIAGNOSIS — R0902 Hypoxemia: Secondary | ICD-10-CM | POA: Diagnosis not present

## 2024-05-24 ENCOUNTER — Other Ambulatory Visit: Payer: Self-pay | Admitting: Internal Medicine

## 2024-05-26 ENCOUNTER — Other Ambulatory Visit (HOSPITAL_BASED_OUTPATIENT_CLINIC_OR_DEPARTMENT_OTHER): Payer: Self-pay

## 2024-05-26 ENCOUNTER — Other Ambulatory Visit: Payer: Self-pay

## 2024-05-26 MED ORDER — AMLODIPINE BESYLATE 5 MG PO TABS
7.5000 mg | ORAL_TABLET | Freq: Every day | ORAL | 0 refills | Status: DC
Start: 2024-05-26 — End: 2024-06-11
  Filled 2024-05-26: qty 30, 20d supply, fill #0

## 2024-06-11 ENCOUNTER — Other Ambulatory Visit: Payer: Self-pay

## 2024-06-11 ENCOUNTER — Other Ambulatory Visit: Payer: Self-pay | Admitting: Internal Medicine

## 2024-06-11 ENCOUNTER — Other Ambulatory Visit (HOSPITAL_BASED_OUTPATIENT_CLINIC_OR_DEPARTMENT_OTHER): Payer: Self-pay

## 2024-06-11 MED ORDER — AMLODIPINE BESYLATE 5 MG PO TABS
7.5000 mg | ORAL_TABLET | Freq: Every day | ORAL | 1 refills | Status: AC
  Filled 2024-06-11: qty 135, 90d supply, fill #0
  Filled 2024-09-09: qty 135, 90d supply, fill #1

## 2024-06-13 DIAGNOSIS — R0902 Hypoxemia: Secondary | ICD-10-CM | POA: Diagnosis not present

## 2024-06-13 DIAGNOSIS — G4733 Obstructive sleep apnea (adult) (pediatric): Secondary | ICD-10-CM | POA: Diagnosis not present

## 2024-06-14 ENCOUNTER — Other Ambulatory Visit (HOSPITAL_BASED_OUTPATIENT_CLINIC_OR_DEPARTMENT_OTHER): Payer: Self-pay

## 2024-06-14 ENCOUNTER — Other Ambulatory Visit: Payer: Self-pay | Admitting: Student

## 2024-06-14 MED ORDER — ALLOPURINOL 100 MG PO TABS
ORAL_TABLET | ORAL | 0 refills | Status: DC
  Filled 2024-06-14: qty 84, 42d supply, fill #0

## 2024-06-15 ENCOUNTER — Other Ambulatory Visit (HOSPITAL_BASED_OUTPATIENT_CLINIC_OR_DEPARTMENT_OTHER): Payer: Self-pay

## 2024-06-15 MED ORDER — METOPROLOL SUCCINATE ER 25 MG PO TB24
25.0000 mg | ORAL_TABLET | Freq: Every day | ORAL | 0 refills | Status: DC
  Filled 2024-06-15: qty 30, 30d supply, fill #0

## 2024-06-18 ENCOUNTER — Other Ambulatory Visit (HOSPITAL_BASED_OUTPATIENT_CLINIC_OR_DEPARTMENT_OTHER): Payer: Self-pay

## 2024-06-25 ENCOUNTER — Ambulatory Visit (HOSPITAL_COMMUNITY): Admitting: Physician Assistant

## 2024-06-29 ENCOUNTER — Ambulatory Visit (HOSPITAL_COMMUNITY)
Admission: RE | Admit: 2024-06-29 | Discharge: 2024-06-29 | Disposition: A | Discharge status: Home / Self Care | Source: Ambulatory Visit | Attending: Physician Assistant | Admitting: Physician Assistant

## 2024-06-29 VITALS — BP 152/94 | HR 67 | Ht 72.0 in | Wt >= 6400 oz

## 2024-06-29 DIAGNOSIS — Z79899 Other long term (current) drug therapy: Secondary | ICD-10-CM | POA: Diagnosis not present

## 2024-06-29 DIAGNOSIS — Z5181 Encounter for therapeutic drug level monitoring: Secondary | ICD-10-CM

## 2024-06-29 DIAGNOSIS — I4819 Other persistent atrial fibrillation: Secondary | ICD-10-CM | POA: Diagnosis not present

## 2024-06-29 DIAGNOSIS — I4891 Unspecified atrial fibrillation: Secondary | ICD-10-CM | POA: Diagnosis not present

## 2024-06-29 NOTE — Progress Notes (Signed)
 Primary Care Physician: Default, Provider, MD Primary Cardiologist: Dr Mona  Primary Electrophysiologist: Dr Kennyth  Referring Physician: Josefa Beauvais NP   Charles Barnett is a 58 y.o. male with a history of morbid obesity, OSA, atrial flutter, HTN, chronic back pain s/p spinal stimulator who presents for follow up in the [facility].  The patient was initially diagnosed with atrial flutter in 2018 after presenting to urgent care with symptoms of SOB and fatigue. He was started on Xarelto  for a CHADS2VASC score of 1 and underwent DCCV on 01/30/17. He was found to be back in atrial flutter on follow up 06/04/21 and had another DCCV on 06/27/21. Unfortunately, he had quick return of his atrial flutter. He was started on Multaq  and had repeat DCCV on 07/25/21. Patient is s/p DCCV on 05/09/22.   Patient is s/p DCCV x 4 on 05/28/23 which was unsuccessful. Multaq  discontinued. Patient is s/p dofetilide  admission 06/2023.  Patient returns for follow up for atrial fibrillation and dofetilide  monitoring. He reports that he has done well since his last visit. He denies any interim symptoms of afib. He has been trying to walk at least 1500 steps per day and has lost 6-7 lbs. No bleeding issues on anticoagulation.   Today, he  denies symptoms of palpitations, chest pain, shortness of breath, orthopnea, PND, lower extremity edema, dizziness, presyncope, syncope, bleeding, or neurologic sequela. The patient is tolerating medications without difficulties and is otherwise without complaint today.    Atrial Fibrillation Risk Factors:  he does have symptoms or diagnosis of sleep apnea. he does not have a history of rheumatic fever.   Atrial Fibrillation Management history:  Previous antiarrhythmic drugs: Multaq , dofetilide   Previous cardioversions: 01/30/17, 06/27/21, 07/25/21, 05/09/22, 05/28/23 Previous ablations: none Anticoagulation history: Xarelto     Past Medical History:   Diagnosis Date   Arthritis    Atrial flutter (HCC) 12/2016   Hypertension    Neuromuscular disorder (HCC)    Sleep apnea     Current Outpatient Medications  Medication Sig Dispense Refill   albuterol  (VENTOLIN  HFA) 108 (90 Base) MCG/ACT inhaler Inhale 1 puff into the lungs every 4 (four) hours as needed. 6.7 g 0   amLODipine  (NORVASC ) 5 MG tablet Take 1.5 tablets (7.5 mg total) by mouth daily. 135 tablet 1   baclofen  (LIORESAL ) 10 MG tablet Take 10 mg by mouth 3 (three) times daily as needed for muscle spasms. (Patient taking differently: Take 10 mg by mouth as needed for muscle spasms.)     dofetilide  (TIKOSYN ) 500 MCG capsule Take 1 capsule (500 mcg total) by mouth 2 (two) times daily. 180 capsule 1   furosemide  (LASIX ) 20 MG tablet Take 1 tablet (20 mg total) by mouth daily as needed (swelling/wt gain). 10 tablet 0   ipratropium-albuterol  (DUONEB) 0.5-2.5 (3) MG/3ML SOLN Take 3 mLs (1 vial) by nebulization every 6 (six) hours as needed. 90 mL 0   metoprolol  succinate (TOPROL  XL) 25 MG 24 hr tablet Take 1 tablet (25 mg total) by mouth daily. 30 tablet 0   montelukast  (SINGULAIR ) 10 MG tablet Take 1 tablet (10 mg total) by mouth every evening. 90 tablet 0   naproxen sodium (ALEVE) 220 MG tablet Take 440 mg by mouth See admin instructions. Four times a week (Patient taking differently: Take 440 mg by mouth 3 (three) times a week.)     potassium chloride  SA (KLOR-CON  M) 20 MEQ tablet Take 1 tablet (20 mEq total) by mouth daily.  90 tablet 1   rivaroxaban  (XARELTO ) 20 MG TABS tablet Take 1 tablet (20 mg total) by mouth daily with food. 90 tablet 3   ergocalciferol  (VITAMIN D2) 1.25 MG (50000 UT) capsule Take 2 capsules (100,000 Units total) by mouth 2 (two) times a week. (Patient not taking: Reported on 06/29/2024) 24 capsule 3   No current facility-administered medications for this encounter.    ROS- All systems are reviewed and negative except as per the HPI above.  Physical  Exam: Vitals:   06/29/24 1115  BP: (!) 152/94  Pulse: 67  Weight: (!) 214.2 kg  Height: 6' (1.829 m)    GEN: Well nourished, well developed in no acute distress CARDIAC: Regular rate and rhythm, no murmurs, rubs, gallops RESPIRATORY:  Clear to auscultation without rales, wheezing or rhonchi  ABDOMEN: Soft, non-tender, non-distended EXTREMITIES:  No edema; No deformity    Wt Readings from Last 3 Encounters:  06/29/24 (!) 214.2 kg  03/03/24 (!) 217.4 kg  10/29/23 (!) 214.6 kg    EKG today demonstrates  SR, 1st degree AV block Vent. rate 67 BPM PR interval 238 ms QRS duration 102 ms QT/QTcB 438/462 ms   Echo 01/08/17 demonstrated  - Left ventricle: The cavity size was normal. Wall thickness was    increased in a pattern of mild LVH. Systolic function was normal.    The estimated ejection fraction was in the range of 55% to 60%.    Indeterminant diastolic function (atrial fibrillation). Although    no diagnostic regional wall motion abnormality was identified,    this possibility cannot be completely excluded on the basis of    this study.  - Ventricular septum: Mildly D-shaped interventricular septum,    suggestive of RV pressure/volume overload.  - Aortic valve: There was no stenosis.  - Mitral valve: There was no significant regurgitation.  - Right ventricle: The cavity size was mildly to moderately    dilated. Systolic function was normal.  - Right atrium: The atrium was mildly dilated.  - Atrial septum: Atrial septal aneurysm noted.  - Pulmonary arteries: No complete TR doppler jet so unable to    estimate PA systolic pressure.  - Systemic veins: IVC was not visualized.   Impressions:   - Technically difficult study with poor acoustic windows. Normal LV    size with mild LV hypertrophy. EF 55-60%. Mildly D-shaped    interventricular septum is suggestive of a degree of RV    pressure/volume overload. Mild to moderate RV dilation with    normal systolic function.  Unable to estimate PA systolic pressure    on this study.   Epic records are reviewed at length today   CHA2DS2-VASc Score = 1  The patient's score is based upon: CHF History: 0 HTN History: 1 Diabetes History: 0 Stroke History: 0 Vascular Disease History: 0 Age Score: 0 Gender Score: 0       ASSESSMENT AND PLAN: Persistent Atrial Fibrillation/atrial flutter (ICD10:  I48.19) The patient's CHA2DS2-VASc score is 1, indicating a 0.6% annual risk of stroke.   Previously failed Multaq  S/p dofetilide  loading 06/2023 Patient appears to be maintaining SR Continue dofetilide  500 mcg BID Continue Xarelto  20 mg daily Continue Toprol  25 mg daily Kardia mobile for home monitoring.    High Risk Medication Monitoring (ICD 10: [id number]) Patient requires ongoing monitoring for anti-arrhythmic medication which has the potential to cause life threatening arrhythmias. QT interval on ECG acceptable for dofetilide  monitoring. Check bmet/mag today.     Obesity  Body mass index is 64.04 kg/m.  Encouraged lifestyle modification  OSA  Encouraged nightly CPAP  HTN Mildly elevated today, well controlled at home. No changes today.    Follow up in the AF clinic in 6 months.     Daril Kicks PA-C Afib Clinic [facility] [address] [place], [place] [address] [phone] 06/29/2024 11:31 AM

## 2024-06-30 ENCOUNTER — Ambulatory Visit (HOSPITAL_COMMUNITY): Payer: Self-pay | Admitting: Physician Assistant

## 2024-06-30 LAB — BASIC METABOLIC PANEL WITH GFR
BUN/Creatinine Ratio: 11 (ref 9–20)
BUN: 10 mg/dL (ref 6–24)
CO2: 24 mmol/L (ref 20–29)
Calcium: 10.8 mg/dL — ABNORMAL HIGH (ref 8.7–10.2)
Chloride: 103 mmol/L (ref 96–106)
Creatinine, Ser: 0.89 mg/dL (ref 0.76–1.27)
Glucose: 86 mg/dL (ref 70–99)
Potassium: 5.4 mmol/L — ABNORMAL HIGH (ref 3.5–5.2)
Sodium: 142 mmol/L (ref 134–144)
eGFR: 99 mL/min/1.73 (ref >59)

## 2024-06-30 LAB — MAGNESIUM: Magnesium: 2 mg/dL (ref 1.6–2.3)

## 2024-07-02 ENCOUNTER — Other Ambulatory Visit (HOSPITAL_COMMUNITY): Payer: Self-pay | Admitting: *Deleted

## 2024-07-02 DIAGNOSIS — I4891 Unspecified atrial fibrillation: Secondary | ICD-10-CM

## 2024-07-13 ENCOUNTER — Other Ambulatory Visit: Payer: Self-pay | Admitting: Internal Medicine

## 2024-07-13 ENCOUNTER — Other Ambulatory Visit (HOSPITAL_BASED_OUTPATIENT_CLINIC_OR_DEPARTMENT_OTHER): Payer: Self-pay

## 2024-07-13 ENCOUNTER — Other Ambulatory Visit (HOSPITAL_COMMUNITY): Payer: Self-pay | Admitting: Physician Assistant

## 2024-07-13 ENCOUNTER — Other Ambulatory Visit: Payer: Self-pay

## 2024-07-13 DIAGNOSIS — R0902 Hypoxemia: Secondary | ICD-10-CM | POA: Diagnosis not present

## 2024-07-13 MED ORDER — POTASSIUM CHLORIDE CRYS ER 20 MEQ PO TBCR
20.0000 meq | EXTENDED_RELEASE_TABLET | Freq: Every day | ORAL | 1 refills | Status: AC
  Filled 2024-07-13: qty 90, 90d supply, fill #0
  Filled 2024-10-11: qty 90, 90d supply, fill #1

## 2024-07-13 MED ORDER — DOFETILIDE 500 MCG PO CAPS
500.0000 ug | ORAL_CAPSULE | Freq: Two times a day (BID) | ORAL | 1 refills | Status: AC
  Filled 2024-07-13: qty 180, 90d supply, fill #0
  Filled 2024-10-11: qty 180, 90d supply, fill #1

## 2024-07-14 ENCOUNTER — Other Ambulatory Visit (HOSPITAL_BASED_OUTPATIENT_CLINIC_OR_DEPARTMENT_OTHER): Payer: Self-pay

## 2024-07-14 MED ORDER — METOPROLOL SUCCINATE ER 25 MG PO TB24
25.0000 mg | ORAL_TABLET | Freq: Every day | ORAL | 0 refills | Status: DC
  Filled 2024-07-14: qty 15, 15d supply, fill #0

## 2024-07-22 ENCOUNTER — Other Ambulatory Visit (HOSPITAL_BASED_OUTPATIENT_CLINIC_OR_DEPARTMENT_OTHER): Payer: Self-pay

## 2024-07-26 ENCOUNTER — Other Ambulatory Visit (HOSPITAL_BASED_OUTPATIENT_CLINIC_OR_DEPARTMENT_OTHER): Payer: Self-pay

## 2024-07-26 DIAGNOSIS — M109 Gout, unspecified: Secondary | ICD-10-CM | POA: Diagnosis not present

## 2024-07-26 DIAGNOSIS — I10 Essential (primary) hypertension: Secondary | ICD-10-CM | POA: Diagnosis not present

## 2024-07-26 DIAGNOSIS — Z0001 Encounter for general adult medical examination with abnormal findings: Secondary | ICD-10-CM | POA: Diagnosis not present

## 2024-07-26 DIAGNOSIS — G894 Chronic pain syndrome: Secondary | ICD-10-CM | POA: Diagnosis not present

## 2024-07-26 DIAGNOSIS — G4730 Sleep apnea, unspecified: Secondary | ICD-10-CM | POA: Diagnosis not present

## 2024-07-26 DIAGNOSIS — I4821 Permanent atrial fibrillation: Secondary | ICD-10-CM | POA: Diagnosis not present

## 2024-07-26 DIAGNOSIS — J449 Chronic obstructive pulmonary disease, unspecified: Secondary | ICD-10-CM | POA: Diagnosis not present

## 2024-07-26 MED ORDER — ALBUTEROL SULFATE HFA 108 (90 BASE) MCG/ACT IN AERS
1.0000 | INHALATION_SPRAY | RESPIRATORY_TRACT | 0 refills | Status: DC | PRN
  Filled 2024-07-26: qty 6.7, 33d supply, fill #0

## 2024-07-26 MED ORDER — MONTELUKAST SODIUM 10 MG PO TABS
10.0000 mg | ORAL_TABLET | Freq: Every evening | ORAL | 0 refills | Status: DC
  Filled 2024-07-26: qty 90, 90d supply, fill #0

## 2024-07-26 MED ORDER — IPRATROPIUM-ALBUTEROL 0.5-2.5 (3) MG/3ML IN SOLN
3.0000 mL | Freq: Four times a day (QID) | RESPIRATORY_TRACT | 0 refills | Status: AC | PRN
  Filled 2024-07-26: qty 180, 15d supply, fill #0

## 2024-07-28 ENCOUNTER — Other Ambulatory Visit: Payer: Self-pay | Admitting: Internal Medicine

## 2024-07-28 ENCOUNTER — Other Ambulatory Visit (HOSPITAL_BASED_OUTPATIENT_CLINIC_OR_DEPARTMENT_OTHER): Payer: Self-pay

## 2024-07-28 MED ORDER — ERGOCALCIFEROL 1.25 MG (50000 UT) PO CAPS
1.0000 | ORAL_CAPSULE | ORAL | 0 refills | Status: AC
  Filled 2024-07-28: qty 12, 84d supply, fill #0

## 2024-08-06 ENCOUNTER — Other Ambulatory Visit (HOSPITAL_BASED_OUTPATIENT_CLINIC_OR_DEPARTMENT_OTHER): Payer: Self-pay

## 2024-08-06 ENCOUNTER — Telehealth (HOSPITAL_COMMUNITY): Payer: Self-pay | Admitting: *Deleted

## 2024-08-06 ENCOUNTER — Other Ambulatory Visit (HOSPITAL_COMMUNITY): Payer: Self-pay | Admitting: *Deleted

## 2024-08-06 MED ORDER — METOPROLOL SUCCINATE ER 25 MG PO TB24
25.0000 mg | ORAL_TABLET | Freq: Every day | ORAL | 2 refills | Status: AC
  Filled 2024-08-06: qty 90, 90d supply, fill #0

## 2024-08-06 NOTE — Telephone Encounter (Signed)
 Patient called in stating he feels back in afib for the last day or so. HRs 90-110s BP currently 130/86.  Discussed with Daril Kicks PA will increase metoprolol  to 25mg  BID until back in normal rhythm.  Pt will call back next week if continues to be in afib despite medication increase to be seen. Pt in agreement.

## 2024-08-13 ENCOUNTER — Other Ambulatory Visit (HOSPITAL_BASED_OUTPATIENT_CLINIC_OR_DEPARTMENT_OTHER): Payer: Self-pay

## 2024-08-13 DIAGNOSIS — R0902 Hypoxemia: Secondary | ICD-10-CM | POA: Diagnosis not present

## 2024-08-20 ENCOUNTER — Other Ambulatory Visit (HOSPITAL_BASED_OUTPATIENT_CLINIC_OR_DEPARTMENT_OTHER): Payer: Self-pay

## 2024-08-25 ENCOUNTER — Other Ambulatory Visit (HOSPITAL_BASED_OUTPATIENT_CLINIC_OR_DEPARTMENT_OTHER): Payer: Self-pay

## 2024-08-25 MED ORDER — ALBUTEROL SULFATE HFA 108 (90 BASE) MCG/ACT IN AERS
1.0000 | INHALATION_SPRAY | RESPIRATORY_TRACT | 0 refills | Status: DC | PRN
  Filled 2024-09-15: qty 6.7, 33d supply, fill #0

## 2024-08-27 ENCOUNTER — Other Ambulatory Visit (HOSPITAL_BASED_OUTPATIENT_CLINIC_OR_DEPARTMENT_OTHER): Payer: Self-pay

## 2024-09-10 ENCOUNTER — Other Ambulatory Visit (HOSPITAL_BASED_OUTPATIENT_CLINIC_OR_DEPARTMENT_OTHER): Payer: Self-pay

## 2024-09-10 MED ORDER — ALLOPURINOL 300 MG PO TABS
300.0000 mg | ORAL_TABLET | Freq: Every day | ORAL | 0 refills | Status: AC
  Filled 2024-09-10: qty 90, 90d supply, fill #0

## 2024-09-12 DIAGNOSIS — G4733 Obstructive sleep apnea (adult) (pediatric): Secondary | ICD-10-CM | POA: Diagnosis not present

## 2024-09-12 DIAGNOSIS — R0902 Hypoxemia: Secondary | ICD-10-CM | POA: Diagnosis not present

## 2024-09-15 ENCOUNTER — Other Ambulatory Visit (HOSPITAL_BASED_OUTPATIENT_CLINIC_OR_DEPARTMENT_OTHER): Payer: Self-pay

## 2024-10-11 ENCOUNTER — Other Ambulatory Visit (HOSPITAL_BASED_OUTPATIENT_CLINIC_OR_DEPARTMENT_OTHER): Payer: Self-pay

## 2024-10-11 ENCOUNTER — Other Ambulatory Visit: Payer: Self-pay

## 2024-10-11 MED ORDER — ALBUTEROL SULFATE HFA 108 (90 BASE) MCG/ACT IN AERS: 1.0000 | INHALATION_SPRAY | RESPIRATORY_TRACT | 0 refills | Status: AC | PRN

## 2024-10-21 ENCOUNTER — Other Ambulatory Visit (HOSPITAL_BASED_OUTPATIENT_CLINIC_OR_DEPARTMENT_OTHER): Payer: Self-pay

## 2024-10-21 MED ORDER — MONTELUKAST SODIUM 10 MG PO TABS
10.0000 mg | ORAL_TABLET | Freq: Every evening | ORAL | 0 refills | Status: AC
  Filled 2024-10-21: qty 90, 90d supply, fill #0

## 2024-12-28 ENCOUNTER — Ambulatory Visit (HOSPITAL_COMMUNITY): Admitting: Physician Assistant
